# Patient Record
Sex: Female | Born: 1955 | Race: White | Hispanic: No | Marital: Married | Smoking: Former smoker
Health system: Southern US, Community
[De-identification: ages and names within clinical notes are randomized; demographics above are authoritative.]

## PROBLEM LIST (undated history)

## (undated) DIAGNOSIS — K219 Gastro-esophageal reflux disease without esophagitis: Secondary | ICD-10-CM

## (undated) DIAGNOSIS — L409 Psoriasis, unspecified: Secondary | ICD-10-CM

## (undated) DIAGNOSIS — C50919 Malignant neoplasm of unspecified site of unspecified female breast: Secondary | ICD-10-CM

## (undated) DIAGNOSIS — R519 Headache, unspecified: Secondary | ICD-10-CM

## (undated) DIAGNOSIS — R0600 Dyspnea, unspecified: Secondary | ICD-10-CM

## (undated) DIAGNOSIS — M1990 Unspecified osteoarthritis, unspecified site: Secondary | ICD-10-CM

## (undated) DIAGNOSIS — I10 Essential (primary) hypertension: Secondary | ICD-10-CM

## (undated) DIAGNOSIS — Z9109 Other allergy status, other than to drugs and biological substances: Secondary | ICD-10-CM

## (undated) DIAGNOSIS — H35419 Lattice degeneration of retina, unspecified eye: Secondary | ICD-10-CM

## (undated) DIAGNOSIS — C801 Malignant (primary) neoplasm, unspecified: Secondary | ICD-10-CM

## (undated) DIAGNOSIS — Z961 Presence of intraocular lens: Secondary | ICD-10-CM

## (undated) DIAGNOSIS — J3089 Other allergic rhinitis: Secondary | ICD-10-CM

## (undated) DIAGNOSIS — M549 Dorsalgia, unspecified: Secondary | ICD-10-CM

## (undated) HISTORY — PX: HX BREAST BIOPSY: SHX20

## (undated) HISTORY — PX: HX LASER EYE SURGERY: 2100001140

## (undated) HISTORY — DX: Other allergic rhinitis: J30.89

## (undated) HISTORY — DX: Lattice degeneration of retina, unspecified eye: H35.419

## (undated) HISTORY — DX: Psoriasis, unspecified: L40.9

## (undated) HISTORY — DX: Presence of intraocular lens: Z96.1

## (undated) HISTORY — DX: Dorsalgia, unspecified: M54.9

## (undated) HISTORY — DX: Other allergy status, other than to drugs and biological substances: Z91.09

## (undated) HISTORY — DX: Essential (primary) hypertension: I10

## (undated) HISTORY — DX: Malignant neoplasm of unspecified site of unspecified female breast: C50.919

## (undated) HISTORY — PX: TUBAL LIGATION: SHX77

## (undated) HISTORY — PX: BREAST LUMPECTOMY: SHX2

## (undated) HISTORY — PX: BREAST SURGERY: SHX581

## (undated) HISTORY — PX: BREAST BIOPSY: SHX20

## (undated) HISTORY — PX: EYE SURGERY: SHX253

## (undated) NOTE — Procedures (Signed)
Procedure(s): DG ARTHRO W/ FLUORO HIP RIGHT  Pre-Procedure Diagnose(s): Chronic right hip pain  Post-Procedure Diagnose(s): Chronic right hip pain  Formatting of this note might be different from the original.  CLINICAL DATA:  Right hip pain  EXAM:  RIGHT HIP INJECTION UNDER FLUOROSCOPY  COMPARISON:  Radiograph 11/13/2021  FLUOROSCOPY:  Radiation Exposure Index (as provided by the fluoroscopic device): 28.2 mGy Kerma  PROCEDURE:   I discussed the risks (including hemorrhage, infection, and local anesthetic chondotoxicity, among others), benefits, and alternatives to the procedure with the patient.  We specifically discussed the high technical likelihood of success of the procedure.  The patient understood and elected to undergo the procedure.      Standard time-out was employed.  Following sterile skin prep and local anesthetic administration consisting of 1% lidocaine, initial attempts to access the right hip joint using a 3.5 inch 22 gauge needle were unsuccessful, with small volumes of injected Omnipaque 300 contrast medium tracking along the iloipsoas.  It was apparent that the 3.5 inch needle was not quite long enough to access the joint.  Subsequently, a 20 gauge 5 inch needle was advanced without difficulty into the right hip joint under fluoroscopic guidance, intra-articular location confirmed with brief contrast injection.  Forty mg Depo-Medrol and 5 ml Sensorcaine 0.5% were then administered.  The needle was subsequently removed and the skin cleansed and bandaged.  No immediate complications were observed.    IMPRESSION:  Technically successful therapeutic right hip injection under fluoroscopy.    Electronically signed by Sherryl Barters, MD at 11/26/2021 11:56 AM EDT

## (undated) NOTE — Telephone Encounter (Signed)
Formatting of this note might be different from the original.  Images from the original note were not included.    Prescription Request    01/24/2022    Is this a "Controlled Substance" medicine? no    Have you seen your PCP in the last 2 weeks? no    If YES, route message to pool  -  If NO, patient needs to be scheduled for appointment.    What is the name of the medication or equipment? omeprazole (PRILOSEC) 20 MG capsule, atorvastatin (LIPITOR) 20 MG tablet, baclofen (LIORESAL) 10 MG tablet, montelukast (SINGULAIR) 10 MG tablet    Have you contacted your pharmacy to request a refill? yes     Which pharmacy would you like this sent to? [facility] ([facility]) - [place], [place]    Patient notified that their request is being sent to the clinical staff for review and that they should receive a response within 2 business days.     Electronically signed by Augustin Schooling at 01/24/2022  3:42 PM EDT

---
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
## 2006-05-27 DIAGNOSIS — H33311 Horseshoe tear of retina without detachment, right eye: Secondary | ICD-10-CM

## 2006-05-27 HISTORY — DX: Horseshoe tear of retina without detachment, right eye: H33.311

## 2007-03-11 ENCOUNTER — Ambulatory Visit (HOSPITAL_COMMUNITY): Payer: Self-pay | Admitting: FAMILY PRACTICE

## 2007-06-18 ENCOUNTER — Ambulatory Visit (INDEPENDENT_AMBULATORY_CARE_PROVIDER_SITE_OTHER): Payer: Self-pay | Admitting: Neurological Surgery

## 2007-06-25 ENCOUNTER — Encounter (INDEPENDENT_AMBULATORY_CARE_PROVIDER_SITE_OTHER): Payer: 59 | Admitting: Neurological Surgery

## 2010-05-27 DIAGNOSIS — Z8669 Personal history of other diseases of the nervous system and sense organs: Secondary | ICD-10-CM

## 2010-05-27 HISTORY — DX: Personal history of other diseases of the nervous system and sense organs: Z86.69

## 2010-05-27 HISTORY — PX: HX RETINAL DETACHMENT REPAIR: SHX105

## 2010-11-03 NOTE — Letter (Signed)
[facility] Department of Neurosurgery     [facility]  [address], [address]  [place], [place] [address]       PATIENT NAME: Patricia Martinez, Patricia Martinez  CHART NUMBER: [id number]  DATE OF BIRTH: 1955-10-18  DATE OF SERVICE: 06/25/2007    June 28, 2007     Merita Norton, MD  [address]   [place], [place] [address]    Dear Dr. Sela Hilding:    I saw Patricia Martinez in the [place] office on June 25, 2007.  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  She presents today with only axial back pain.  There is no leg pain and no reports of weakness in the lower extremities.    On physical examination, there are decreased knee and ankle reflexes bilaterally.  Motor is 5/5.  Straight-leg raise is negative.    Lumbar CT scan from July 2008 was reviewed and shows moderate stenosis at L4-L5, and there is what appears to be a synovial cyst on the right at L4-L5.  At the L5-S1 level, there is severe bilateral neural foraminal narrowing and the left S1 nerve root in particular looks swollen and inflamed.    Despite the rather dramatic findings on MRI scan, the patient's neurologic exam is basically normal and she does not have lower extremity pain.  Therefore, I would continue with conservative treatment and a trial of Motrin for rather advanced case of multilevel degenerative changes throughout the lumbar spine.  If the patient should develop lower extremity pain in the sciatic distribution, we will need to see her back for surgical reevaluation.    Thank you very much and if you have any questions, please feel free to call me.     Sincerely,      Jola Baptist, MD  Associate Professor  [facility] Department of Neurosurgery    [id number]; D: 06/28/2007 18:32:44; T: 06/30/2007 78:46:96

## 2011-05-28 HISTORY — PX: HX CATARACT REMOVAL: SHX102

## 2014-01-14 ENCOUNTER — Encounter (HOSPITAL_COMMUNITY): Payer: Self-pay

## 2014-01-14 NOTE — Ancillary Notes (Addendum)
[place] Record for: Patricia, Martinez  Created: 01/11/2014 1:29:18 PM  MRN: [id number]  DOB: 13-Aug-1955  SSN: [id number]  Sex: Female  Height: 5 Feet 9 Inches - Weight: Blunt Name:   Address:   [address]: [place], [place] [address]  E-Mail:   Day Phone: [phone] Night Phone:  Other Phone:   Phone comments:   Call Back time:   Authorized contact: norman--husbandbenjamin--son  Intake Date: 01/11/2014 1:29:18 PM  PCP: Clydene Laming MD, Rodman Key   RefMD: Self , Referral   Primary Insurance: [employer]  Secondary Insurance:   Insurance Comments:      -------Referral Assignment------  Where was the original referral directed?: [facility]  Was a specific reviewer requested?: Specific MD Requested  How was the reviewer selected?:   Who requested the specific reviewer?: Patient Preference  How did you hear of our spine program?:   Is this a second opinion?:      -------Web [place]----------  Favorite Color:   City of Birth:      ---------- 1st Review ----------     Review Date: 01/27/2014  Review Completed by: John Iran  Impression: DDD of Lumbar Spine  Disposition: Appointment/Treatment with Colleague  Appt w/ Colleague: First Available Appointment  Colleague Name: Delray Alt, MD  Appt How Soon:   Pre Treatment Type: Physical Therapy; Weight Management  Pre Treatment Type Details:   Pre Treatment Other Test:   Appt Type:   Instructions: LBP and bilateral hip vs buttock painMRI and x-rays with mild DDD mod L5-S1 and L4-5 facet arthrosis.Left greater than right foraminal narrowing.Canal wide open.Needs PT, weight loss and see a non-surgical provider.  Could consider facet injections.  No surgery indicated.     ---------- Symptoms ----------     Chief complaint: lbp--b/l hipno leg/groin/buttock pain  Diagnosis from Other MD:      Symptoms: Pain,Muscular weakness  Other Symptom Description:   Pain Location: Low back,Hip  Other Pain Location Description:   Where is your pain the worst?: Low  back  Pain Type: Sharp/Stabbing,Constant,Dull/Aching  Pain Rating: 8  Does the pain radiate to other parts of your body? No   Radiate Where:   Other Description:   Does it radiate to the fingers?   Does it radiate below the elbow?   Which specific part of your arm?   Which fingers?   Which part of your arm does pain go to?   How does it radiate to the arm?   Does it radiate to the toes?   Does it radiate below the knee?   Which specific part of your leg?   Which toes?   Which part of your leg does pain go to?   How does it radiate to the leg?   Additional pain information      Location of Numbness/tingling:   Other Description:  Does numbness/tingling radiate to other parts of your body?:  Where does the numbess/tingling radiate?:  Other Description:  Does the numbness/tingling radiate below your elbow?:  Which specific part of your arm?:  Which fingers:  Which pat of your arm does the numbness/tingling go to?:  How does the numbness/tingling radiate to your arm?:  Does the numbness/tingling radiate below your knee?:  Which specific part of your leg?:  Which toes:  Which part of your leg does the numbness/tingling go to?:  How does the numbness/tingling radiate to your leg?:  Additional Numbness/tingling information:  Other Description:  Location of Weakness: Left Arm,Right Arm  Other Description:   Additional Weakness Information: patient stated it's been going on for a year or more--painful to lift things--pt is right handed     The symptoms have been present for: more than 1 year  The symptoms began: 1987 fell down steps--has been getting worse  Was there a specific event that caused your symptoms?: fell in 1987  Additional Narrative Description:   Are you able to perform your daily activities with these symptoms?: Yes  Since what date have you been unable to perform your daily routine?:   The symptoms improve when you: Walk,Lie down,Rest  Other activities that improve your symptoms:   The symptoms worsen  when you: Stand,Activity  Other activities that worsen your symptoms:      ---------- Work History ----------     Are you able to work?: Yes  Reasons for not working:   Other Reason for not working:   Do you have Work Restrictions: No  If applicable, maximum lifting restriction:   How long have you been unable to work:   Have you ever filed a W/C claim related to a neck or back injury?: no  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 No  Allergies:   Other Details:   Allergic to Latex?: No  Allergic to intravenous contrast dyes?: No  Allergic to steroids?: No     ---------- Treatment/Testing ----------      Taking prescription medication for this problem?: Yes  Are you using any now?: Yes  Medication: doesn't know the name; MD Name: ; Date Prescribed: ; Dosage: ; Times/Day:   Have you received a Medrol dose pack for this problem?: No  When did you last take the dosepack?:   Were your symptoms improved?:   Would you describe your relief as:   How long did you experience that amount of relief?:   Other Medrol dosepack information:      --------------- PT ---------------     PT: No  When Received:   Where Received:   Other where received information:   Visits:   Types:   Other Types:   Improved:      If improved, describe level of relief:   If improved, how long did you experience relief:   Other Physical Therapy Treatment Information:      ---------- Chiropractic Services ----------     Chiro: No  When:   Who:   Visits:   Types:   Other Types:   Were Symptoms Improved:    If improved, describe level of relief:   If improved, how long did you experience relief:   Other Chiropractic Treatment Information:      --------------- ESI ---------------     ESI: No  When:   Who:   Visits:   Types:   Improved:   If improved, describe level of relief:   If improved, how long did you experience relief:   Other Injection Treatment Information:      ---------- Diagnostic Tests ----------  Plain spine X-rays On:08-20-13 Where: [facility]  Area(s) Scanned: Lumbar  MRI scan On:08-20-13 Where: [facility]  Area Scanned: Lumbar  Do you have any of the following in case an MRI is ordered?: Claustrophobia  Other MRI factors:      ---------- Past Medical History ----------     What other doctors/providers have treated you for these spine issues? Dr. Rudene Re Specialty: Neurosurgery Date: 2009    Ever diagnosed with Spine deformity?:   Prior neck or back surgery (1)?: No  When:   Who:   Area of neck/spine operated on:   Level:   Were symptoms improved?:   If improved, describe level of relief:  If improved, how long did you experience relief?:   Prior neck or back surgery (2)?:   When:   Who:   Area of neck/spine operated on:   Level:   Were symptoms improved?:   If improved, describe level of relief:  If improved, how long did you experience relief?:   Prior neck or back surgery (3)?:   When:   Who:   Area of neck/spine operated on:   Level:   Were symptoms improved?:   If improved, describe level of relief:  If improved, how long did you experience relief?:   Do you have any of the following assistive devices? Scooter  How long have you required these assistive devices?   Currently being treated for any other medical condition?: No  Conditions:   Other Conditions:   Type of neurologic disorder:   Cancer Type:   Other Treatment/Medication:   What blood thinners are you currently taking?: None  Which physicians are treating you for your other medical conditions?:   Do you smoke?: No  Are  you pre-menopausal or post-menopausal?:   If recommended, are you willing to consider surgery?:   What is your goal in seeking treatment?:   Other pertinent information/ general comments/ goals for treatment: no hx ca     ---------- Care Coordinator Information ----------     Care Coordinator: RN  Activity Log: 01/27/2014 12:17:16 PM [Lori Mueller]: Phone call to patient to discuss MD initial impression and recommendations.  Left message and phone  with husband Mariea Clonts for patient to return call, as he wanted [place] to speak with patient. Antionette Poles, RN, CRRN; 02/01/2014 8:37:54 AM Lattie Haw Herod]: Phone call to patient. Reviewed patient's medical history.  Patient states no change in symptoms.  Discussed MD initial impression and recommendations of PT, weight loss management and first available with non op MD, Dr. Donnal Debar.  Provided education on DDD of lumbar spine and facet arthrosis.  Patient chose to decline recommendations. She states she is already seeing a MD for her back issues.  Explained that a letter will be faxed to the PCP/referring physician regarding this conversation. Patient verbalized understanding.  Completing chart at this time.  Monia Pouch, RN     Letter/Test Coordinator: Letters  Letter/Test Coordinator Log:      Intake Specialist Comments:      Clinic Staff Comments:      Last Edited byMonia Pouch on 02/01/2014 8:39:36 AM  Last Review by: John Iran on 01/27/2014

## 2015-02-28 ENCOUNTER — Ambulatory Visit (INDEPENDENT_AMBULATORY_CARE_PROVIDER_SITE_OTHER): Payer: Self-pay | Admitting: Ophthalmology

## 2015-02-28 ENCOUNTER — Ambulatory Visit (INDEPENDENT_AMBULATORY_CARE_PROVIDER_SITE_OTHER): Payer: HMO | Admitting: OPHTHALMOLOGY

## 2015-02-28 ENCOUNTER — Ambulatory Visit (HOSPITAL_COMMUNITY)
Admission: RE | Admit: 2015-02-28 | Discharge: 2015-02-28 | Disposition: A | Discharge status: Home / Self Care | Payer: HMO | Source: Ambulatory Visit | Attending: OPHTHALMOLOGY | Admitting: OPHTHALMOLOGY

## 2015-02-28 ENCOUNTER — Ambulatory Visit
Admission: RE | Admit: 2015-02-28 | Discharge: 2015-02-28 | Disposition: A | Discharge status: Home / Self Care | Payer: HMO | Source: Ambulatory Visit | Attending: OPHTHALMOLOGY | Admitting: OPHTHALMOLOGY

## 2015-02-28 ENCOUNTER — Encounter (INDEPENDENT_AMBULATORY_CARE_PROVIDER_SITE_OTHER): Payer: Self-pay | Admitting: OPHTHALMOLOGY

## 2015-02-28 DIAGNOSIS — Z961 Presence of intraocular lens: Secondary | ICD-10-CM | POA: Insufficient documentation

## 2015-02-28 DIAGNOSIS — Z9109 Other allergy status, other than to drugs and biological substances: Secondary | ICD-10-CM | POA: Insufficient documentation

## 2015-02-28 DIAGNOSIS — H35419 Lattice degeneration of retina, unspecified eye: Secondary | ICD-10-CM

## 2015-02-28 DIAGNOSIS — H33311 Horseshoe tear of retina without detachment, right eye: Secondary | ICD-10-CM

## 2015-02-28 DIAGNOSIS — M549 Dorsalgia, unspecified: Secondary | ICD-10-CM | POA: Insufficient documentation

## 2015-02-28 DIAGNOSIS — Z8669 Personal history of other diseases of the nervous system and sense organs: Secondary | ICD-10-CM

## 2015-02-28 DIAGNOSIS — H26491 Other secondary cataract, right eye: Secondary | ICD-10-CM

## 2015-02-28 DIAGNOSIS — Z87891 Personal history of nicotine dependence: Secondary | ICD-10-CM | POA: Insufficient documentation

## 2015-02-28 DIAGNOSIS — H33001 Unspecified retinal detachment with retinal break, right eye: Secondary | ICD-10-CM

## 2015-02-28 DIAGNOSIS — H26493 Other secondary cataract, bilateral: Secondary | ICD-10-CM | POA: Insufficient documentation

## 2015-02-28 DIAGNOSIS — M1990 Unspecified osteoarthritis, unspecified site: Secondary | ICD-10-CM

## 2015-02-28 HISTORY — PX: HX YAG: 210000615

## 2015-02-28 NOTE — Telephone Encounter (Signed)
Patient's husband called because the patient is having pain in the right eye. Is s/p Yag capsulotomy today with Dr. Tomma Rakers. Recommended using either Ice packs or warm compresses (which ever helps) to improve pain, as well as tylenol. I offered to see the patient on [facility] if they desired however they live 2 hours away. They we instructed to call back if symptoms worsen or if vision deteriorates.     Lilla Shook, MD  02/28/2015, 17:50

## 2015-02-28 NOTE — Progress Notes (Addendum)
[place]  Operated by [facility]  [place] [address]  Dept: [phone]    Patient Name: Patricia Martinez  MRN#: [id number]  Rudd: [birth year]    Date of Service: 02/28/2015    Chief Complaint     Blurred Vision          Patricia Martinez is a 59 y.o. female who presents today for evaluation/consultation of:  HPI     Blurred Vision   In right eye.           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 Also lattice degernation OS s/p laser. Pt states OD vision is foggy x 1 yr or so. Pt states oS seems fine. PT states no eye pain. Pt states floaters common OS. No flashes. NO drops.        Last edited by Carey Bullocks, COT on 02/28/2015  9:53 AM. (History)        ROS     Positive for: Eyes (retina)    Negative for: Constitutional, Gastrointestinal, Neurological, Skin, Genitourinary, Musculoskeletal, HENT, Endocrine, Cardiovascular, Respiratory, Psychiatric, Allergic/Imm, Heme/Lymph    Last edited by Carey Bullocks, COT on 02/28/2015  9:53 AM. (History)           Carey Bullocks, COT 02/28/2015, 10:01     MD Addition to HPI: Patricia Martinez is a new patient referred by Dr Noreene Filbert from [facility]. Pt states she has no eye pain, however, her vision is foggy in her right eye.     Past Surgical History   Procedure Laterality Date    Hx retinal detachment repair Right 2012    Hx cataract removal Bilateral 2013     IOL OU - Dr Truman Hayward    Hx breast biopsy      Hx cesarean section             Past Medical History   Diagnosis Date    Arthritis     Back pain     H/O retinal detachment 2012     OD    Retinal tear of right eye 2008     OD    Lattice degeneration      OS    Pseudophakia of both eyes     Environmental allergies            Patient Active Problem List   Diagnosis    Spinal Stenosis - Lumbar    Arthritis    Back pain    H/O retinal detachment    Retinal tear of right eye    Lattice  degeneration    Pseudophakia of both eyes    Environmental allergies       Family History:  Family History   Problem Relation Age of Onset    Retinal Detachment Brother            Social History:     Social History     Social History    Marital Status: Married     Spouse Name: N/A    Number of Children: N/A    Years of Education: N/A     Social History Main Topics    Smoking status: Former Smoker    Smokeless tobacco: Never Used      Comment: quit 25 yrs ago    Alcohol Use: No    Drug Use: Not on  file    Sexual Activity: Not on file     Other Topics Concern    Not on file     Social History Narrative        Social History   Substance Use Topics    Smoking status: Former Smoker    Smokeless tobacco: Never Used      Comment: quit 25 yrs ago    Alcohol Use: No            My Assessment:    ICD-10-CM    1. Arthritis M19.90    2. Back pain M54.9    3. H/O retinal detachment Z86.69 OPH OCT BI   4. Retinal tear of right eye H33.001 OPH OCT BI   5. 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 Pseudophakia of both eyes Z96.1    7. Environmental allergies Z91.09        Ophthalmic Plan of Care:    1. 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 Discussed R/B/A of Yag OD. After much thought and careful consideration, the patient understands and wishes to proceed.   - Will proceed with Yag OD today     2. Pseudophakia OU  - Stable    3. ERM OU  OCT OD: ERM, slightly blunt foveal contour, ERM over foveal center, mild surface wrinkling, no IRF/SRF, baseline, macula attached  OCT OS: ERM nasally, slightly attenuated foveal contour, no extension of ERM into foveal center, no IRF/SRF, attached, baseline   - Monitor    4. PVD OS  - Monitor  I reviewed the signs and symptoms of retinal detachment with the patient, including new flashes or increased frequency of flashes, floaters or a change in number  or pattern of floaters, any decline in vision or change in visual field.  The patient understands and will contact us immediately or go to the nearest Emergency Department if these symptoms occur.    5. Hx of RD OD  S/p laser for "lattice" in her left eye (per history)  Retina attached in both eyes.  Will monitor.         I discussed the above diagnoses listed in the assessment and the above ophthalmic plan of care with the patient and patient's family.    Follow up:    I have asked Ashauna to follow up Post Op 2 weeks.          Documented chief complaint, history of present illness, allergies, review of systems, past medical, past surgical, family and social history were reviewed by me and when necessary I made changes to the technician note.  I also reviewed and agree with the findings documented in ophthalmology exam tab.  All questions were answered.      Orders Placed This Encounter   Procedures    OPH OCT BI       No orders of the defined types were placed in this encounter.       I scribed a portion of the encounter including Chief Complaint, HPI, Impression, Plan, and exam elements excluding visual acuity, lensometry, pupil assessment, confrontational visual fields, tonometry results, extraocular motility assessment, refractometry results, and assessment of mood and orientation. I scribed this note at the request of the physician who personally performed the services documented.  Tally Joe, SCRIBE 02/28/2015, 10:45    I scribed a  portion of the encounter including Chief Complaint, HPI, Impression, Plan, and exam elements excluding visual acuity, lensometry, pupil assessment, confrontational visual fields, tonometry results, extraocular motility assessment, refractometry results, and assessment of mood and orientation. I scribed this note at the request of the physician who personally performed the services documented.  Tally Joe, SCRIBE  02/28/2015, 10:45    I have reviewed and confirmed the ROS, PFSH,  and exam elements performed and documented by the technician. The scribed portion of the progress note was scribed on my behalf and at my direction. I have reviewed and attest to the accuracy of the note.   Jorie Zee [place], MD 02/28/2015, 13:00

## 2015-02-28 NOTE — Procedures (Addendum)
Laser Operative Report           Indication/Diagnosis:  Posterior Capsular Opacity    Type of laser performed: Posterior Capsulotomy (YAG)    Informed Consent Obtained?  yes  Surgical Pause:  yes    Time:  11:34AM    Anesthesia :  Topical     Eye: OD       Power/Energy 1.40mJ   Half Fluence NA   Spot Size  NA   Duration  NA   Number [id number]     Argon:  -- YAG laser      Findings/Additional Notes/Comment:  Other:   Conditions/complications: Few nicks on peripheral IOL, all non-central, all in far periphery outside of pupillary border when not dilated   The patient tolerated the procedure well and left the exam room in good condition.    The postoperative instructions were given, including medications and activity level, as well as a follow up appointment.    Janyra Barillas [place], MD  02/28/2015, 12:58

## 2015-03-16 ENCOUNTER — Encounter (INDEPENDENT_AMBULATORY_CARE_PROVIDER_SITE_OTHER): Payer: Self-pay | Admitting: OPHTHALMOLOGY

## 2015-03-16 ENCOUNTER — Ambulatory Visit: Payer: HMO | Attending: OPHTHALMOLOGY | Admitting: OPHTHALMOLOGY

## 2015-03-16 DIAGNOSIS — H43812 Vitreous degeneration, left eye: Secondary | ICD-10-CM | POA: Insufficient documentation

## 2015-03-16 DIAGNOSIS — H26491 Other secondary cataract, right eye: Secondary | ICD-10-CM | POA: Insufficient documentation

## 2015-03-16 DIAGNOSIS — Z48810 Encounter for surgical aftercare following surgery on the sense organs: Secondary | ICD-10-CM | POA: Insufficient documentation

## 2015-03-16 DIAGNOSIS — Z9841 Cataract extraction status, right eye: Secondary | ICD-10-CM | POA: Insufficient documentation

## 2015-03-16 DIAGNOSIS — Z9889 Other specified postprocedural states: Secondary | ICD-10-CM

## 2015-03-16 DIAGNOSIS — Z9849 Cataract extraction status, unspecified eye: Secondary | ICD-10-CM

## 2015-03-16 DIAGNOSIS — Z9842 Cataract extraction status, left eye: Secondary | ICD-10-CM | POA: Insufficient documentation

## 2015-03-16 DIAGNOSIS — H35373 Puckering of macula, bilateral: Secondary | ICD-10-CM | POA: Insufficient documentation

## 2015-03-16 DIAGNOSIS — Z961 Presence of intraocular lens: Secondary | ICD-10-CM

## 2015-03-16 DIAGNOSIS — H35419 Lattice degeneration of retina, unspecified eye: Secondary | ICD-10-CM | POA: Insufficient documentation

## 2015-03-16 MED ORDER — ERYTHROMYCIN 5 MG/GRAM (0.5 %) EYE OINTMENT
TOPICAL_OINTMENT | Freq: Three times a day (TID) | OPHTHALMIC | Status: DC
Start: 2015-03-16 — End: 2015-11-06

## 2015-03-16 NOTE — Progress Notes (Addendum)
[place]  Operated by [facility]  [place] [address]  Dept: [phone]    Patient Name: Patricia Martinez  MRN#: [id number]  [place]: [birth year]    Date of Service: 03/16/2015    Chief Complaint     Post-op Eye          Yennifer Jeanetta Alonzo is a 59 y.o. female who presents today for evaluation/consultation of:  HPI     Post-op Eye   In right eye.  Pain was noted as 0/10.           Comments   Pt here for 2 week f/u after having YAG OD at last visit. Pt states vision is much better OD. Pt denies any eye pain.   Pt not using any eye drops daily.        Last edited by Betsey Amen, Ophthalmic T on 03/16/2015  8:20 AM. (History)        ROS     Positive for: Eyes (YAG OD)    Negative for: Constitutional, Gastrointestinal, Neurological, Skin, Genitourinary, Musculoskeletal, HENT, Endocrine, Cardiovascular, Respiratory, Psychiatric, Allergic/Imm, Heme/Lymph    Last edited by Betsey Amen, Ophthalmic T on 03/16/2015  8:20 AM. (History)           Betsey Amen, Ophthalmic T 03/16/2015, 08:32     MD Addition to HPI: Pt is here for a post op visit. PT denies any pain. Pt not currently on any drops.     Past Surgical History   Procedure Laterality Date    Hx retinal detachment repair Right 2012    Hx cataract removal Bilateral 2013     IOL OU - Dr Truman Hayward    Hx breast biopsy      Hx cesarean section      Hx yag Right 02/28/2015           Past Medical History   Diagnosis Date    Arthritis     Back pain     H/O retinal detachment 2012     OD    Retinal tear of right eye 2008     OD    Lattice degeneration      OS    Pseudophakia of both eyes     Environmental allergies            Patient Active Problem List   Diagnosis    Spinal Stenosis - Lumbar    Arthritis    Back pain    H/O retinal detachment    Retinal tear of right eye    Lattice degeneration    Pseudophakia of both eyes    Environmental allergies       Family History:  Family History   Problem  Relation Age of Onset    Retinal Detachment Brother            Social History:     Social History     Social History    Marital Status: Married     Spouse Name: N/A    Number of Children: N/A    Years of Education: N/A     Social History Main Topics    Smoking status: Former Smoker    Smokeless tobacco: Never Used      Comment: quit 25 yrs ago    Alcohol Use: No    Drug Use: Not on file    Sexual Activity: Not on file     Other Topics Concern  Not on file     Social History Narrative        Social History   Substance Use Topics    Smoking status: Former Smoker    Smokeless tobacco: Never Used      Comment: quit 25 yrs ago    Alcohol Use: No            My Assessment:    ICD-10-CM    1. PCO (posterior capsular opacification), right H26.491    2. S/P YAG capsulotomy, unspecified laterality Z98.49    3. Post-operative state Z98.890    4. Pseudophakia of both eyes Z96.1    5. Epiretinal membrane (ERM) of both eyes H35.373    6. PVD (posterior vitreous detachment), left eye H43.812    7. Lattice degeneration, unspecified laterality H35.419        Ophthalmic Plan of Care:    1. PCO OU (OD>OS)s/p YAG capsulotomy OD   - Few attachments of PC still holding peripherally; Will proceed with supplemental Yag today     2. Pseudophakia OU s/p Yag OD   - See above     3. ERM OU  OCT OD: ERM, slightly blunt foveal contour, ERM over foveal center, mild surface wrinkling, no IRF/SRF, baseline, macula attached  OCT OS: ERM nasally, slightly attenuated foveal contour, no extension of ERM into foveal center, no IRF/SRF, attached, baseline   - Monitor    4. PVD OS  - Monitor  I reviewed the signs and symptoms of retinal detachment with the patient, including new flashes or increased frequency of flashes, floaters or a change in number or pattern of floaters, any decline in vision or change in visual field. The patient understands and will contact us immediately or go to the nearest Emergency Department if these symptoms  occur.    5. Hx of RD OD S/p laser for "lattice" in her left eye (per history)  - Retina attached in both eyes. Will monitor.       I discussed the above diagnoses listed in the assessment and the above ophthalmic plan of care with the patient and patient's family.    Follow up:    I have asked Shae to follow up in 1 month, DFE, OCT OU.          Documented chief complaint, history of present illness, allergies, review of systems, past medical, past surgical, family and social history were reviewed by me and when necessary I made changes to the technician note.  I also reviewed and agree with the findings documented in ophthalmology exam tab.  All questions were answered.      No orders of the defined types were placed in this encounter.       No orders of the defined types were placed in this encounter.       I scribed a portion of the encounter including Chief Complaint, HPI, Impression, Plan, and exam elements excluding visual acuity, lensometry, pupil assessment, confrontational visual fields, tonometry results, extraocular motility assessment, refractometry results, and assessment of mood and orientation. I scribed this note at the request of the physician who personally performed the services documented.  Tally Joe, SCRIBE 03/16/2015, 09:30    I scribed a portion of the encounter including Chief Complaint, HPI, Impression, Plan, and exam elements excluding visual acuity, lensometry, pupil assessment, confrontational visual fields, tonometry results, extraocular motility assessment, refractometry results, and assessment of mood and orientation. I scribed this note at the request of the physician who personally performed  the services documented.  Tally Joe, SCRIBE  03/16/2015, 09:30      I have reviewed and confirmed the ROS, PFSH, and exam elements performed and documented by the technician. The scribed portion of the progress note was scribed on my behalf and at my direction. I have reviewed and  attest to the accuracy of the note.   De Libman Hart [place], MD 03/16/2015, 13:01      03/16/2015  I saw and examined the patient.  I reviewed the fellow's note.  I agree with the findings and plan of care as documented in the fellow's note.  Any exceptions/additions are edited/noted.    Shamira Toutant Hart [place], MD

## 2015-04-17 ENCOUNTER — Ambulatory Visit
Admission: RE | Admit: 2015-04-17 | Discharge: 2015-04-17 | Disposition: A | Discharge status: Home / Self Care | Payer: HMO | Source: Ambulatory Visit | Attending: OPHTHALMOLOGY | Admitting: OPHTHALMOLOGY

## 2015-04-17 ENCOUNTER — Encounter (INDEPENDENT_AMBULATORY_CARE_PROVIDER_SITE_OTHER): Payer: Self-pay | Admitting: OPHTHALMOLOGY

## 2015-04-17 ENCOUNTER — Ambulatory Visit (HOSPITAL_BASED_OUTPATIENT_CLINIC_OR_DEPARTMENT_OTHER): Payer: HMO | Admitting: OPHTHALMOLOGY

## 2015-04-17 DIAGNOSIS — H26491 Other secondary cataract, right eye: Secondary | ICD-10-CM

## 2015-04-17 DIAGNOSIS — H33001 Unspecified retinal detachment with retinal break, right eye: Secondary | ICD-10-CM | POA: Insufficient documentation

## 2015-04-17 DIAGNOSIS — H43812 Vitreous degeneration, left eye: Secondary | ICD-10-CM | POA: Insufficient documentation

## 2015-04-17 DIAGNOSIS — H33311 Horseshoe tear of retina without detachment, right eye: Secondary | ICD-10-CM

## 2015-04-17 DIAGNOSIS — Z8669 Personal history of other diseases of the nervous system and sense organs: Secondary | ICD-10-CM

## 2015-04-17 DIAGNOSIS — H35373 Puckering of macula, bilateral: Secondary | ICD-10-CM

## 2015-04-17 DIAGNOSIS — H35419 Lattice degeneration of retina, unspecified eye: Secondary | ICD-10-CM

## 2015-04-17 NOTE — Progress Notes (Addendum)
[place]  Operated by [facility]  [address]  [place] [address]  Dept: [phone]    Patient Name: Patricia Martinez  MRN#: [id number]  [place]: 06-23-1955    Date of Service: 04/17/2015    Chief Complaint     Follow Up - Retina Problem          Patricia Martinez is a 59 y.o. female who presents today for evaluation/consultation of:  HPI     1 month OCT/DFE OU to monitor ERM OU, PVD, PCO  Pt states vision stable OU @ dist and near x LOV  Denies any pain  / discomfort  Denies any new fl /fl         Last edited by Patricia Martinez, Patricia Martinez on 04/17/2015 10:42 AM.     ROS     Negative for: Constitutional, Gastrointestinal, Neurological, Skin, Genitourinary, Musculoskeletal, HENT, Endocrine, Cardiovascular, Eyes, Respiratory, Psychiatric, Allergic/Imm, Heme/Lymph    Last edited by Patricia Martinez on 04/17/2015 10:42 AM. (History)           Patricia Martinez, [facility] 04/17/2015, 10:45     MD Addition to HPI:     Past Surgical History   Procedure Laterality Date    Hx retinal detachment repair Right 2012    Hx cataract removal Bilateral 2013     IOL OU - Patricia Martinez    Hx breast biopsy      Hx cesarean section      Hx yag Right 02/28/2015           Past Medical History   Diagnosis Date    Arthritis     Back pain     H/O retinal detachment 2012     OD    Retinal tear of right eye 2008     OD    Lattice degeneration      OS    Pseudophakia of both eyes     Environmental allergies            Patient Active Problem List   Diagnosis    Spinal Stenosis - Lumbar    Arthritis    Back pain    H/O retinal detachment    Retinal tear of right eye    Lattice degeneration    Pseudophakia of both eyes    Environmental allergies    S/P YAG capsulotomy    Right posterior capsular opacification    Post-operative state    PVD (posterior vitreous detachment), left eye    Epiretinal membrane (ERM) of both eyes       Family History:  Family History   Problem Relation Age of  Onset    Retinal Detachment Brother            Social History:     Social History     Social History    Marital Status: Married     Spouse Name: N/A    Number of Children: N/A    Years of Education: N/A     Social History Main Topics    Smoking status: Former Smoker    Smokeless tobacco: Never Used      Comment: quit 25 yrs ago    Alcohol Use: No    Drug Use: Not on file    Sexual Activity: Not on file     Other Topics Concern    Not on file     Social History Narrative        Social History  Substance Use Topics    Smoking status: Former Smoker    Smokeless tobacco: Never Used      Comment: quit 25 yrs ago    Alcohol Use: No            My Assessment:    ICD-10-CM    1. H/O retinal detachment Z86.69 OPH OCT BI   2. Retinal tear of right eye H33.001 OPH OCT BI   3. Lattice degeneration, unspecified laterality H35.419 OPH OCT BI   4. Right posterior capsular opacification H26.491 OPH OCT BI   5. PVD (posterior vitreous detachment), left eye H43.812 OPH OCT BI   6. Epiretinal membrane (ERM) of both eyes H35.373 OPH OCT BI       Ophthalmic Plan of Care:      1. Pseudophakia OU s/p Yag OD   - Stable; no residual center opacity   - Will re-evaluate in 6 months to consider Yag OS     2. ERM OU  OCT OD: ERM, slightly blunt foveal contour, ERM over foveal center, mild surface wrinkling, no IRF/SRF, macula attached  OCT OS: ERM nasally, slightly attenuated foveal contour, no extension of ERM into foveal center, no IRF/SRF, attached  - Monitor    3. PVD OS  - Monitor  I reviewed the signs and symptoms of retinal detachment with the patient, including new flashes or increased frequency of flashes, floaters or a change in number or pattern of floaters, any decline in vision or change in visual field. The patient understands and will contact us immediately or go to the nearest Emergency Department if these symptoms occur.    4. Hx of RD OD S/p laser for "lattice" in her left eye (per history)  - Retina attached in  both eyes. Will monitor.     I discussed the above diagnoses listed in the assessment and the above ophthalmic plan of care with the patient and patient's family.    Follow up:    I have asked Patricia Martinez to follow up in 6 months, DFE OU OCT OU          Documented chief complaint, history of present illness, allergies, review of systems, past medical, past surgical, family and social history were reviewed by me and when necessary I made changes to the technician note.  I also reviewed and agree with the findings documented in ophthalmology exam tab.  All questions were answered.      Orders Placed This Encounter   Procedures    OPH OCT BI       No orders of the defined types were placed in this encounter.       I scribed a portion of the encounter including Chief Complaint, HPI, Impression, Plan, and exam elements excluding visual acuity, lensometry, pupil assessment, confrontational visual fields, tonometry results, extraocular motility assessment, refractometry results, and assessment of mood and orientation. I scribed this note at the request of the physician who personally performed the services documented.  Patricia Martinez, SCRIBE 04/17/2015, 10:52    I scribed a portion of the encounter including Chief Complaint, HPI, Impression, Plan, and exam elements excluding visual acuity, lensometry, pupil assessment, confrontational visual fields, tonometry results, extraocular motility assessment, refractometry results, and assessment of mood and orientation. I scribed this note at the request of the physician who personally performed the services documented.  Patricia Martinez, SCRIBE  04/17/2015, 10:52      I have reviewed and confirmed the ROS, PFSH, and exam elements performed and documented by  the technician. The scribed portion of the progress note was scribed on my behalf and at my direction. I have reviewed and attest to the accuracy of the note.   Patricia Frisina Hart [place], MD 04/17/2015, 18:04

## 2015-10-16 ENCOUNTER — Encounter (INDEPENDENT_AMBULATORY_CARE_PROVIDER_SITE_OTHER): Payer: HMO | Admitting: OPHTHALMOLOGY

## 2015-11-06 ENCOUNTER — Encounter (INDEPENDENT_AMBULATORY_CARE_PROVIDER_SITE_OTHER): Payer: Self-pay | Admitting: Ophthalmology

## 2015-11-06 ENCOUNTER — Ambulatory Visit
Admission: RE | Admit: 2015-11-06 | Discharge: 2015-11-06 | Disposition: A | Discharge status: Home / Self Care | Payer: HMO | Source: Ambulatory Visit | Attending: Ophthalmology | Admitting: Ophthalmology

## 2015-11-06 ENCOUNTER — Ambulatory Visit (INDEPENDENT_AMBULATORY_CARE_PROVIDER_SITE_OTHER): Payer: HMO | Admitting: Ophthalmology

## 2015-11-06 DIAGNOSIS — H43812 Vitreous degeneration, left eye: Secondary | ICD-10-CM

## 2015-11-06 DIAGNOSIS — H26491 Other secondary cataract, right eye: Secondary | ICD-10-CM

## 2015-11-06 DIAGNOSIS — H35373 Puckering of macula, bilateral: Secondary | ICD-10-CM

## 2015-11-06 DIAGNOSIS — Z8669 Personal history of other diseases of the nervous system and sense organs: Secondary | ICD-10-CM

## 2015-11-06 DIAGNOSIS — H35419 Lattice degeneration of retina, unspecified eye: Secondary | ICD-10-CM | POA: Insufficient documentation

## 2015-11-06 NOTE — Progress Notes (Addendum)
[place]  Operated by [facility]  [place] [address]  Dept: [phone]    Patient Name: Patricia Martinez  MRN#: [id number]  [place]: [birth year]    Date of Service: 11/06/2015    Chief Complaint     ERM (Epiretinal Membrane); Pvd          Patricia Martinez is a 60 y.o. female who presents today for evaluation/consultation of:  HPI     Pt is here today for 6 mo f/u ERM- OU; PVD- OS; HX RD- OD. PT is here for DFE/OCT-OU. PT states that vision is stable, no noticeable changes to vision. PT has a lot of floaters- both eyes-no change; no flashes.       Last edited by Blumetti, Tressia Miners D, COA on 11/06/2015 10:05 AM.     ROS     Positive for: Eyes (ERM- OU; PVD- OS; hx RD- OD; Floaters- OU)    Negative for: Constitutional, Gastrointestinal, Neurological, Skin, Genitourinary, Musculoskeletal, HENT, Endocrine, Cardiovascular, Respiratory, Psychiatric, Allergic/Imm, Heme/Lymph    Last edited by Gabriel Carina D, COA on 11/06/2015 10:08 AM. (History)           [name], COA 11/06/2015, 10:14       Past Surgical History:   Procedure Laterality Date    HX BREAST BIOPSY      HX CATARACT REMOVAL Bilateral 2013    IOL OU - Dr Conchita Paris CESAREAN SECTION      HX RETINAL DETACHMENT REPAIR Right 2012    HX YAG Right 02/28/2015           Past Medical History:   Diagnosis Date    Arthritis     Back pain     Environmental allergies     H/O retinal detachment 2012    OD    Lattice degeneration     OS    Pseudophakia of both eyes     Retinal tear of right eye 2008    OD           No past medical history pertinent negatives.    Patient Active Problem List   Diagnosis    Spinal Stenosis - Lumbar    Arthritis    Back pain    H/O retinal detachment    Retinal tear of right eye    Lattice degeneration    Pseudophakia of both eyes    Environmental allergies    S/P YAG capsulotomy    Right posterior capsular opacification    Post-operative state    PVD (posterior vitreous  detachment), left eye    Epiretinal membrane (ERM) of both eyes       Family History:  Family History   Problem Relation Age of Onset    Retinal Detachment Brother            Social History:     Social History   Substance Use Topics    Smoking status: Former Smoker    Smokeless tobacco: Never Used      Comment: quit 25 yrs ago    Alcohol use No       MD Addition to HPI: flaoters ou stabel for years, no flashes or peripheral visual changes          Assessment:      ICD-10-CM    1. H/O retinal detachment Z86.69 Ibuprofen 200 mg Oral Capsule     OPH OCT BI   2. Lattice degeneration, unspecified laterality  H35.419 Ibuprofen 200 mg Oral Capsule     OPH OCT BI   3. Right posterior capsular opacification H26.491 Ibuprofen 200 mg Oral Capsule     OPH OCT BI   4. PVD (posterior vitreous detachment), left eye H43.812 Ibuprofen 200 mg Oral Capsule     OPH OCT BI   5. Epiretinal membrane (ERM) of both eyes H35.373 Ibuprofen 200 mg Oral Capsule     OPH OCT BI       Ophthalmic Plan of Care:    Previous pt of Dr Tomma Rakers   H/o rd od sp repair in past and h/o laser os     - mild erm ou follow     I reviewed all signs and symptoms of retinal detachments including flashes, new floaters and peripheral visual changes. I informed the patient to return or call us immediately if this occurs or if the patient develops any new visual concerns.         Follow up:    I have asked Marticia Jequetta Aberg to follow up in 1 year dfe ou oct ou          Con Memos, MD 11/06/2015, 11:23    I have seen and examined the above patient. I discussed the above diagnoses listed in the assessment and the above ophthalmic plan of care with the patient and patient's family. All questions were answered. I reviewed and, when necessary, made changes to the technician/resident note, documented ophthalmology exam, chief complaint, history of present illness, allergies, review of systems, past medical, past surgical, family and social history. I personally  reviewed and interpreted all testing and/or imaging performed at this visit and agree with the resident's or fellow's interpretation. Any exceptions/additions are edited/noted in the relevant encounter fields.    Orders Placed This Encounter   Procedures    OPH OCT BI       No orders of the defined types were placed in this encounter.

## 2020-05-27 DIAGNOSIS — Z923 Personal history of irradiation: Secondary | ICD-10-CM

## 2020-05-27 DIAGNOSIS — C50919 Malignant neoplasm of unspecified site of unspecified female breast: Secondary | ICD-10-CM

## 2020-05-27 HISTORY — DX: Malignant neoplasm of unspecified site of unspecified female breast: C50.919

## 2020-05-27 HISTORY — DX: Personal history of irradiation: Z92.3

## 2020-05-27 HISTORY — PX: BREAST LUMPECTOMY: SHX2

## 2020-08-14 ENCOUNTER — Other Ambulatory Visit (INDEPENDENT_AMBULATORY_CARE_PROVIDER_SITE_OTHER): Payer: Self-pay | Admitting: Thoracic Surgery (Cardiothoracic Vascular Surgery)

## 2020-08-14 ENCOUNTER — Other Ambulatory Visit (HOSPITAL_COMMUNITY): Payer: Self-pay | Admitting: Thoracic Surgery (Cardiothoracic Vascular Surgery)

## 2020-08-14 DIAGNOSIS — N6489 Other specified disorders of breast: Secondary | ICD-10-CM

## 2020-08-21 ENCOUNTER — Other Ambulatory Visit (HOSPITAL_COMMUNITY): Payer: Self-pay | Admitting: Thoracic Surgery (Cardiothoracic Vascular Surgery)

## 2020-08-21 DIAGNOSIS — N6320 Unspecified lump in the left breast, unspecified quadrant: Secondary | ICD-10-CM

## 2020-09-01 ENCOUNTER — Other Ambulatory Visit: Payer: Self-pay

## 2020-09-04 ENCOUNTER — Encounter (INDEPENDENT_AMBULATORY_CARE_PROVIDER_SITE_OTHER): Payer: Self-pay | Admitting: Thoracic Surgery (Cardiothoracic Vascular Surgery)

## 2020-09-04 DIAGNOSIS — N6002 Solitary cyst of left breast: Secondary | ICD-10-CM | POA: Insufficient documentation

## 2020-09-05 ENCOUNTER — Ambulatory Visit: Payer: HMO | Attending: Family | Admitting: Family

## 2020-09-05 ENCOUNTER — Ambulatory Visit (HOSPITAL_COMMUNITY)
Admission: RE | Admit: 2020-09-05 | Discharge: 2020-09-05 | Disposition: A | Discharge status: Home / Self Care | Payer: HMO | Source: Ambulatory Visit

## 2020-09-05 ENCOUNTER — Encounter (HOSPITAL_COMMUNITY): Payer: Self-pay

## 2020-09-05 ENCOUNTER — Other Ambulatory Visit: Payer: Self-pay

## 2020-09-05 ENCOUNTER — Ambulatory Visit (HOSPITAL_COMMUNITY): Payer: HMO

## 2020-09-05 ENCOUNTER — Encounter (INDEPENDENT_AMBULATORY_CARE_PROVIDER_SITE_OTHER): Payer: Self-pay | Admitting: Family

## 2020-09-05 DIAGNOSIS — D0512 Intraductal carcinoma in situ of left breast: Secondary | ICD-10-CM

## 2020-09-05 DIAGNOSIS — N63 Unspecified lump in unspecified breast: Secondary | ICD-10-CM

## 2020-09-05 DIAGNOSIS — N6459 Other signs and symptoms in breast: Secondary | ICD-10-CM | POA: Insufficient documentation

## 2020-09-05 DIAGNOSIS — N6320 Unspecified lump in the left breast, unspecified quadrant: Secondary | ICD-10-CM

## 2020-09-05 DIAGNOSIS — Z803 Family history of malignant neoplasm of breast: Secondary | ICD-10-CM | POA: Insufficient documentation

## 2020-09-05 DIAGNOSIS — R928 Other abnormal and inconclusive findings on diagnostic imaging of breast: Secondary | ICD-10-CM | POA: Insufficient documentation

## 2020-09-05 HISTORY — PX: HX BREAST BIOPSY: SHX20

## 2020-09-05 HISTORY — DX: Intraductal carcinoma in situ of left breast: D05.12

## 2020-09-05 MED ORDER — LIDOCAINE HCL 10 MG/ML (1 %) INJECTION SOLUTION
15.0000 mL | Freq: Once | INTRAMUSCULAR | Status: DC
Start: 2020-09-05 — End: 2020-09-06

## 2020-09-05 NOTE — Discharge Instructions (Signed)
Minimally Invasive Breast Biopsy Care Instructions                You may take a shower in 48 hours. Let the water run over the incision. Pat it dry. No bathing or soaking your breast in water until the steri-strips fall off or after your follow-up visit.            If the incision comes open, place a dry band-aid on it. DO NOT apply ointments to the incision.            Check your incision frequently (every hour for the next four hours). If there would be bleeding present, apply firm pressure against the incision. If after 10 minutes of pressure, the area does not stop bleeding, go to the nearest E.R or Urgent Care Center.            Wear the ACE bandage over your bra for the next 12-24 hours as tolerated. Wear your bra continuously for the next 72 hours after removing the ACE bandage.             Keep ice on the incision for 20 minutes and then take it off for 20 minutes continually rotating until bedtime tonight.             You may have mild to moderate discomfort and a small to moderate amount of bruising. This is normal.            If you need medication for discomfort, take non-aspirin products (such as Tylenol or Acetaminophen), as directed by the manufacture for pain. DO NOT USE ASPIRIN for 48 hours.            Please avoid strenuous activities such as heavy cleaning, sweeping, vacuuming, yard work, or exercise for 72 hours. You should not lift anything with the affected side greater than 5 pounds for the next 72 hours.            Watch for signs of infection. Excessive bleeding, drainage, swelling, redness, heat, pain, or fever. If that should happen, please contact your physician. If it would be on a weekend please go to an Urgent Care Center or E.R.            For several days or even a couple weeks, you may have tenderness, discomfort, and a lump. This can be bothersome, but is not abnormal. Usually this will disappear completely within 2-3 months.      Your biopsy specimen will be sent to the  lab for further evaluation. You will have a follow-up appointment to receive your results. The Radiologist or Surgeon may recommend a follow-up mammogram based upon your biopsy results.

## 2020-09-05 NOTE — Progress Notes (Signed)
BREAST CLINIC, [facility] [address]  [place] [address]  Operated by [facility]  Progress Note    Name: Patricia Martinez MRN:  [id number]   Date: 09/05/2020 Age: 65 y.o.     Subjective:     Patient ID:  Patricia Martinez is an 65 y.o. female     Chief Complaint:    Chief Complaint   Patient presents with    Breast Problem     65 Year old with a 0.7 cm oval shaped, hypoechoic lesion in the 5:00 position of the left breast as identified by left diagnostic mammogram/left breast US 08/21/20, Cat 4; Bilateral  screening mammogram 08/12/20 Cat 0.         Breast Problem    Patient presenting for recent abnormal mammogram demonstrating 0.7cm oval shaped hypoechoic lesion at the 5 o'clock position of left breast. This was a screening mammogram; patient otherwise asymptomatic without breast complaints. She does have family history of breast cancer in her mother.  Denies skin changes, nipple changes, nipple discharge, breast pain, or palpable abnormalities.    Review of Systems   All other systems reviewed and are negative.      Objective:         Physical Exam  Chest:   Breasts:      Right: No inverted nipple, mass or tenderness.      Left: Inverted nipple present. No tenderness. Mass: 2 areas of increased density at 9:00 position left breast.       Skin:     General: Skin is warm and dry.   Neurological:      Mental Status: She is alert.   Psychiatric:         Behavior: Behavior normal.     Pulm:  Effort normal.    Breast exam:    The patient has no inverted nipple to the right breast.  Left inverted nipple and nipple scaling (Small area of psorasis 12:00 areola ).  There are fibrocystic changes to the right breast.  There are fibrocystic changes to the left breast.  Mass: 2 areas of increased density at 9:00 position left breast.  No mass within the right breast.  No tenderness to right breast.  No tenderness to left breast.  No right axillary lymph nodes are palpable.  No left axillary lymph nodes  are palpable.               Assessment & Plan:       ICD-10-CM    1. Abnormal mammogram of left breast  R92.8    Patient is scheduled for mammotome biopsy today.  Risks and benefits of procedure explained.  Plan to follow-up in 1 week to review pathology and determine if any further intervention is required.  All questions answered.    Henreitta Cea, FNP-C

## 2020-09-05 NOTE — Patient Instructions (Signed)
Getting Your Breast Biopsy Results  Waiting for biopsy results is never easy. But in most cases, you will get your results within days. Your provider can tell you what to expect.   You may get the results during a follow-up visit with your healthcare provider. Or your provider may call you with the results. Either way, your provider will discuss what thetest results mean for you. The lump may not be cancer. (This is called a benign lump.) Many benign lumps need no care at all. If a lump is cancer (malignant), you will have many things to think about.   Try to take comfort from the fact that more treatment options exist now than ever before. More tests may be needed to learn more about the cancer, such as how big it is and where it is. This helps your provider plan treatment. Your treatment team will work with you to come up with the plan that's best for you.     If a lump is benign  Learning that a lump is not cancer can be a great relief. To stay healthy, be sure to have mammograms as often as directed. Also, be aware of how your breasts normally look and feel so you can notice any changes right away. If you notice any changes, see your doctor to have them checked.   Treating benign lumps  Lumps that come and go with your periods often don't need to be treated. Still, be sure to check with your healthcare provider. Sometimes limiting salt, caffeine, and alcohol can help relieve any soreness or swelling. If a fluid-filled cyst is tender, you may want to have it drained. Or a painful lump could be removed.   If a lump is cancer  Having breast cancer means that some cells in your breast are growing abnormally. These cells grow and divide faster than normal cells and can spread to other parts of your body. Yet today the outlook is hopeful. In fact, the [employer] says that 9 out of 7 women are alive 5 years after a breast cancer diagnosis. And today there are new and better treatments, as well as  more ways to learn details about each woman's cancer to help tailor treatment.   You can play an active role in your cancer care. Talk with your provider about what needs to happen next and the treatment options that are best for you.   Treating breast cancer  There is no one right treatment for breast cancer. The best approach for you will depend on the exact type and the stage of cancer you have. It will also depend on your age, overall health, and your feelings about treatment and side effects. Give yourself some time to weigh all of your options. Learning as much as you can about breast cancer can help you make the decisions that are best for you.   [employer] last reviewed this educational content on 10/26/2018   2000-2021 [employer]. All rights reserved. This information is not intended as a substitute for professional medical care. Always follow your healthcare professional's instructions.

## 2020-09-06 ENCOUNTER — Other Ambulatory Visit: Payer: HMO | Attending: Clinical Pathology/Laboratory Medicine | Admitting: Clinical Pathology/Laboratory Medicine

## 2020-09-06 DIAGNOSIS — Z0000 Encounter for general adult medical examination without abnormal findings: Secondary | ICD-10-CM | POA: Insufficient documentation

## 2020-09-06 LAB — SURGICAL PATHOLOGY SPECIMEN
NUCLEAR GRADE (NOTE C): HIGH
SPECIFY CLOCK POSITION: 5

## 2020-09-07 ENCOUNTER — Telehealth (INDEPENDENT_AMBULATORY_CARE_PROVIDER_SITE_OTHER): Payer: Self-pay | Admitting: Thoracic Surgery (Cardiothoracic Vascular Surgery)

## 2020-09-07 NOTE — Telephone Encounter (Signed)
At Dr. 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 Discussed meaning and reason to change her appt to see Dr. Wilhemina Cash. She accepted appt for 09/18/20 @ 2:45 and knows to cancel 09/13/20 with Roselyn Reef.

## 2020-09-11 LAB — ER/PR, PARAFFIN BLOCK

## 2020-09-13 ENCOUNTER — Encounter (INDEPENDENT_AMBULATORY_CARE_PROVIDER_SITE_OTHER): Payer: Self-pay | Admitting: Family

## 2020-09-14 ENCOUNTER — Encounter (INDEPENDENT_AMBULATORY_CARE_PROVIDER_SITE_OTHER): Payer: Self-pay | Admitting: Thoracic Surgery (Cardiothoracic Vascular Surgery)

## 2020-09-14 DIAGNOSIS — D0512 Intraductal carcinoma in situ of left breast: Secondary | ICD-10-CM | POA: Insufficient documentation

## 2020-09-18 ENCOUNTER — Encounter (INDEPENDENT_AMBULATORY_CARE_PROVIDER_SITE_OTHER): Payer: Self-pay | Admitting: Family

## 2020-09-18 ENCOUNTER — Ambulatory Visit
Payer: HMO | Attending: Thoracic Surgery (Cardiothoracic Vascular Surgery) | Admitting: Thoracic Surgery (Cardiothoracic Vascular Surgery)

## 2020-09-18 ENCOUNTER — Other Ambulatory Visit: Payer: Self-pay

## 2020-09-18 DIAGNOSIS — Z87891 Personal history of nicotine dependence: Secondary | ICD-10-CM | POA: Insufficient documentation

## 2020-09-18 DIAGNOSIS — D0512 Intraductal carcinoma in situ of left breast: Secondary | ICD-10-CM | POA: Insufficient documentation

## 2020-09-18 DIAGNOSIS — Z803 Family history of malignant neoplasm of breast: Secondary | ICD-10-CM | POA: Insufficient documentation

## 2020-09-18 NOTE — Nursing Note (Signed)
Met with patient and Dr Wilhemina Cash. She performed breast exam and went over pathology. ER is still pending. She reviewed the three Dr's who will take care of her and their roles. She also reviewed the different surgical options. She prefers the lumpectomy with SNB. I further went over the above information as well as the breast cancer folder. Her and her husband verbalized understanding and all questions were answered

## 2020-09-19 ENCOUNTER — Other Ambulatory Visit (HOSPITAL_COMMUNITY): Payer: Self-pay | Admitting: Thoracic Surgery (Cardiothoracic Vascular Surgery)

## 2020-09-19 ENCOUNTER — Other Ambulatory Visit (INDEPENDENT_AMBULATORY_CARE_PROVIDER_SITE_OTHER): Payer: Self-pay | Admitting: Thoracic Surgery (Cardiothoracic Vascular Surgery)

## 2020-09-19 ENCOUNTER — Encounter (INDEPENDENT_AMBULATORY_CARE_PROVIDER_SITE_OTHER): Payer: Self-pay | Admitting: Thoracic Surgery (Cardiothoracic Vascular Surgery)

## 2020-09-19 ENCOUNTER — Telehealth (INDEPENDENT_AMBULATORY_CARE_PROVIDER_SITE_OTHER): Payer: Self-pay | Admitting: Thoracic Surgery (Cardiothoracic Vascular Surgery)

## 2020-09-19 DIAGNOSIS — Z853 Personal history of malignant neoplasm of breast: Secondary | ICD-10-CM

## 2020-09-19 NOTE — Nursing Note (Signed)
Spoke to patient with OR/preop dates and times. She had already seen them on MyChart. I explained that she will come early on OR day and go to registration desk on both days. She verbalized understanding

## 2020-09-19 NOTE — Progress Notes (Signed)
BREAST CLINIC, [facility] [address]  [place] [address]  Operated by [facility]     Name: Patricia Martinez MRN:  [id number]   Date: 09/18/2020 Age: 65 y.o.         HPI:      Nurse/MA note reviewed.    Breast Cancer (S/P; left breast US MTOM 09/05/20 for 77mm lesion at 5 o'clock positionrevealed high grade DCIS; left diagnostic mammogram/ left breast US 08/21/20 cat 4; bilateral screening mammogram 08/12/20 cat 0; hx of inverted nipple, hx of mom and MGM of breast cancer)      HPI   Patient presenting for evaluation left breast cancer/DCIS, molecular profile pending.  Patient underwent a bilateral screening mammogram 08/12/2020, category 4 4 suspicious 7 mm nodule at the 5 o'clock position in the left breast.  This was a screening mammogram and patient was otherwise asymptomatic.  She denies any palpable abnormalities, skin or nipple changes or nipple discharge.  Patient subsequently had a mammotome biopsy of the suspicious mass revealing high-grade DCIS.  Patient has had previous benign breast biopsies.  Patient has a family history of breast cancer in her mother diagnosed in her 11s and maternal grandmother diagnosed in her late 4s.  No family history of ovarian cancer.    PMH:   Past Medical History:   Diagnosis Date   . Back pain    . 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 Environmental allergies    . Environmental and seasonal allergies    . Essential hypertension    . H/O retinal detachment 2012    OD   . Lattice degeneration     OS   . Pseudophakia of both eyes    . Psoriasis    . Retinal tear of right eye 2008    OD         Past Surgical History:   Procedure Laterality Date   . HX BREAST BIOPSY     . HX BREAST BIOPSY Left 09/05/2020   . HX CATARACT REMOVAL Bilateral 2013    IOL OU - Dr Truman Hayward   . HX CESAREAN SECTION     . HX RETINAL DETACHMENT REPAIR Right 2012   . HX YAG Right 02/28/2015         Family Medical History:     Problem Relation (Age of Onset)    Breast Cancer Mother  (25), Maternal Grandmother (68)    Retinal Detachment Brother          Current Outpatient Medications   Medication Sig Dispense Refill   . baclofen (LIORESAL) 10 mg Oral Tablet Take 5 mg by mouth Three times a day     . Desoximetasone (TOPICORT) 0.05 % Cream      . Ibuprofen 200 mg Oral Capsule Take 800 mg by mouth Three times a day as needed     . montelukast (SINGULAIR) 10 mg Oral Tablet Take 10 mg by mouth Every evening     . multivitamin with iron Oral Tablet Take 1 Tablet by mouth Once a day     . olmesartan (BENICAR) 40 mg Oral Tablet Take 40 mg by mouth Once a day     . omeprazole (PRILOSEC) 20 mg Oral Capsule, Delayed Release(E.C.) Take 20 mg by mouth Once a day       No current facility-administered medications for this visit.     No Known Allergies  Social History     Socioeconomic History   .  Marital status: Married     Spouse name: Not on file   . Number of children: Not on file   . Years of education: Not on file   . Highest education level: Not on file   Occupational History   . Not on file   Tobacco Use   . Smoking status: Former Research scientist (life sciences)   . Smokeless tobacco: Never Used   . Tobacco comment: quit 25 yrs ago   Substance and Sexual Activity   . Alcohol use: No     Alcohol/week: 0.0 standard drinks   . Drug use: Not on file   . Sexual activity: Not on file   Other Topics Concern   . Not on file   Social History Narrative   . Not on file     Social Determinants of Health     Financial Resource Strain: Not on file   Food Insecurity: Not on file   Transportation Needs: Not on file   Physical Activity: Not on file   Stress: Not on file   Intimate Partner Violence: Not on file   Housing Stability: Not on file       ROS:   Review of systems negative unless marked in HPI.    PHYSICAL EXAM:  Left mammotome biopsy site healing well with minimal post biopsy changes  Bilateral fibrocystic changes, otherwise normal exam bilaterally:  No palpable abnormalities, skin or nipple changes.  No nipple discharge.  Of note,  patient has had chronic left nipple inversion, denies any changes.  No lymphadenopathy appreciated bilaterally      Imaging Reviewed:  I reviewed imaging reports with patient:  7 mm nodule left breast, confirmed high-grade DCIS, no other suspicious findings.    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              ASSESSMENT/PLAN  ENCOUNTER DIAGNOSES     ICD-10-CM   1. Breast neoplasm, Tis (DCIS), left  D05.12      Patient with newly diagnosed high-grade DCIS, left breast 5 o'clock position, molecular profile pending. I reviewed radiology and pathology report with patient in detail. We discussed surgical treatment options including breast conservation vs. mastectomy along with risks and benefits of both. At present, patient is leaning towards breast conservation.  I also discussed with patient given high-grade DCIS, there is a possibility of finding an invasive components at the time of surgery and patient was given option for sentinel lymph node biopsy.  Risks and benefits of surgery and sentinel lymph node biopsy discussed with patient and patient would like to proceed with sentinel lymph node mapping at the time of her lumpectomy.  Plan to schedule in OR for left breast lumpectomy with sentinel lymph node biopsy . We briefly discussed the role of adjuvant radiation and endocrine therapy. Plan to schedule post operative consultation with JDP and medical onocology to discuss adjuvant treatment plan in detail.  Will discuss patient at upcoming tumor board.    Breast cancer education performed by myself and all questions answered in detail.   Time spent in consultation: 1  hour  Education: Breat cancer      Forest Becker, MD

## 2020-09-20 ENCOUNTER — Other Ambulatory Visit (INDEPENDENT_AMBULATORY_CARE_PROVIDER_SITE_OTHER): Payer: Self-pay | Admitting: Thoracic Surgery (Cardiothoracic Vascular Surgery)

## 2020-09-20 DIAGNOSIS — D0512 Intraductal carcinoma in situ of left breast: Secondary | ICD-10-CM

## 2020-09-26 ENCOUNTER — Ambulatory Visit
Admission: RE | Admit: 2020-09-26 | Discharge: 2020-09-26 | Disposition: A | Discharge status: Home / Self Care | Payer: HMO | Source: Ambulatory Visit | Attending: Thoracic Surgery (Cardiothoracic Vascular Surgery) | Admitting: Thoracic Surgery (Cardiothoracic Vascular Surgery)

## 2020-09-26 ENCOUNTER — Other Ambulatory Visit: Payer: Self-pay

## 2020-09-26 ENCOUNTER — Encounter (HOSPITAL_COMMUNITY): Payer: Self-pay

## 2020-09-26 VITALS — BP 146/88 | HR 80 | Temp 96.5°F | Resp 20 | Ht 69.0 in | Wt 261.9 lb

## 2020-09-26 DIAGNOSIS — Z01818 Encounter for other preprocedural examination: Secondary | ICD-10-CM | POA: Insufficient documentation

## 2020-09-26 HISTORY — DX: Malignant (primary) neoplasm, unspecified (CMS HCC): C80.1

## 2020-09-26 HISTORY — DX: Gastro-esophageal reflux disease without esophagitis: K21.9

## 2020-09-26 NOTE — Anesthesia Preprocedure Evaluation (Signed)
ANESTHESIA PRE-OP EVALUATION  Planned Procedure: LUMPECTOMY NEEDLE LOCALIZATION AND SENTINEL LYMPH NODE MAPPING (Left Breast)  Review of Systems     anesthesia history negative               Pulmonary   past history of smoking  (1990),   Cardiovascular    Hypertension ,No peripheral edema,  Exercise Tolerance: > or = 4 METS        GI/Hepatic/Renal    GERD and well controlled     Endo/Other    osteoarthritis and obesity,       Neuro/Psych/MS    back abnormality     Cancer  CA,  breast cancer,                 Physical Assessment      Airway       Mallampati: II    TM distance: >3 FB    Neck ROM: full  Mouth Opening: good.            Dental       Dentition intact             Pulmonary    Breath sounds clear to auscultation  (-) no rhonchi, no decreased breath sounds, no wheezes, no rales and no stridor     Cardiovascular    Rhythm: regular  Rate: Normal  (-) no friction rub, carotid bruit is not present, no peripheral edema and no murmur     Other findings            Plan  ASA 3     Planned anesthesia type: general                           Anesthetic plan and risks discussed with patient.

## 2020-09-27 NOTE — OR PreOp (Signed)
09/27/2020 Preop EKG/older EKG reviewed by Dr Janora Norlander who states this is acceptable for surgery [facility]

## 2020-10-03 ENCOUNTER — Ambulatory Visit (HOSPITAL_COMMUNITY): Payer: HMO

## 2020-10-03 ENCOUNTER — Other Ambulatory Visit (HOSPITAL_COMMUNITY): Payer: HMO

## 2020-10-03 ENCOUNTER — Encounter (HOSPITAL_COMMUNITY): Payer: Self-pay

## 2020-10-03 ENCOUNTER — Other Ambulatory Visit: Payer: Self-pay

## 2020-10-03 ENCOUNTER — Inpatient Hospital Stay
Admission: RE | Admit: 2020-10-03 | Discharge: 2020-10-03 | Disposition: A | Discharge status: Home / Self Care | Payer: HMO | Source: Ambulatory Visit | Attending: Thoracic Surgery (Cardiothoracic Vascular Surgery) | Admitting: Thoracic Surgery (Cardiothoracic Vascular Surgery)

## 2020-10-03 ENCOUNTER — Encounter (HOSPITAL_COMMUNITY)
Admission: RE | Disposition: A | Discharge status: Home / Self Care | Payer: Self-pay | Source: Ambulatory Visit | Attending: Thoracic Surgery (Cardiothoracic Vascular Surgery)

## 2020-10-03 ENCOUNTER — Other Ambulatory Visit (INDEPENDENT_AMBULATORY_CARE_PROVIDER_SITE_OTHER): Payer: Self-pay | Admitting: Thoracic Surgery (Cardiothoracic Vascular Surgery)

## 2020-10-03 ENCOUNTER — Ambulatory Visit (HOSPITAL_COMMUNITY): Payer: HMO | Admitting: Anesthesiology

## 2020-10-03 ENCOUNTER — Ambulatory Visit (HOSPITAL_COMMUNITY)
Admission: RE | Admit: 2020-10-03 | Discharge: 2020-10-03 | Disposition: A | Discharge status: Home / Self Care | Payer: HMO | Source: Ambulatory Visit

## 2020-10-03 ENCOUNTER — Inpatient Hospital Stay (HOSPITAL_COMMUNITY)
Admission: RE | Admit: 2020-10-03 | Discharge: 2020-10-03 | Disposition: A | Discharge status: Home / Self Care | Payer: HMO | Source: Ambulatory Visit

## 2020-10-03 DIAGNOSIS — D0512 Intraductal carcinoma in situ of left breast: Secondary | ICD-10-CM

## 2020-10-03 DIAGNOSIS — M549 Dorsalgia, unspecified: Secondary | ICD-10-CM | POA: Insufficient documentation

## 2020-10-03 DIAGNOSIS — K219 Gastro-esophageal reflux disease without esophagitis: Secondary | ICD-10-CM | POA: Insufficient documentation

## 2020-10-03 DIAGNOSIS — I10 Essential (primary) hypertension: Secondary | ICD-10-CM | POA: Insufficient documentation

## 2020-10-03 DIAGNOSIS — D0502 Lobular carcinoma in situ of left breast: Secondary | ICD-10-CM

## 2020-10-03 DIAGNOSIS — Z87891 Personal history of nicotine dependence: Secondary | ICD-10-CM | POA: Insufficient documentation

## 2020-10-03 DIAGNOSIS — Z853 Personal history of malignant neoplasm of breast: Secondary | ICD-10-CM

## 2020-10-03 DIAGNOSIS — Z803 Family history of malignant neoplasm of breast: Secondary | ICD-10-CM | POA: Insufficient documentation

## 2020-10-03 DIAGNOSIS — Z79899 Other long term (current) drug therapy: Secondary | ICD-10-CM | POA: Insufficient documentation

## 2020-10-03 HISTORY — PX: BREAST LUMPECTOMY W/ NEEDLE LOCALIZATION: SHX1266

## 2020-10-03 HISTORY — PX: SENTINEL LYMPH NODE BIOPSY: SHX2392

## 2020-10-03 SURGERY — LUMPECTOMY NEEDLE LOCALIZATION AND SENTINEL LYMPH NODE MAPPING
Anesthesia: General | Site: Breast | Laterality: Left | Wound class: Clean Wound: Uninfected operative wounds in which no inflammation occurred

## 2020-10-03 MED ORDER — GLYCOPYRROLATE 0.2 MG/ML INJECTION SOLUTION
INTRAMUSCULAR | Status: AC
Start: 2020-10-03 — End: 2020-10-03
  Filled 2020-10-03: qty 3

## 2020-10-03 MED ORDER — PROPOFOL 10 MG/ML IV BOLUS
INJECTION | Freq: Once | INTRAVENOUS | Status: DC | PRN
Start: 2020-10-03 — End: 2020-10-03
  Administered 2020-10-03: 200 mg via INTRAVENOUS

## 2020-10-03 MED ORDER — NEOSTIGMINE METHYLSULFATE 1 MG/ML INTRAVENOUS SOLUTION
INTRAVENOUS | Status: AC
Start: 2020-10-03 — End: 2020-10-03
  Filled 2020-10-03: qty 10

## 2020-10-03 MED ORDER — SODIUM CHLORIDE 0.9 % (FLUSH) INJECTION SYRINGE
10.0000 mL | INJECTION | INTRAMUSCULAR | Status: DC | PRN
Start: 2020-10-03 — End: 2020-10-03

## 2020-10-03 MED ORDER — FENTANYL (PF) 50 MCG/ML INJECTION SOLUTION
Freq: Once | INTRAMUSCULAR | Status: DC | PRN
Start: 2020-10-03 — End: 2020-10-03
  Administered 2020-10-03 (×2): 50 ug via INTRAVENOUS

## 2020-10-03 MED ORDER — FENTANYL (PF) 50 MCG/ML INJECTION SOLUTION
INTRAMUSCULAR | Status: AC
Start: 2020-10-03 — End: 2020-10-03
  Filled 2020-10-03: qty 2

## 2020-10-03 MED ORDER — FAMOTIDINE 20 MG TABLET
20.0000 mg | ORAL_TABLET | Freq: Once | ORAL | Status: AC
Start: 2020-10-03 — End: 2020-10-03
  Administered 2020-10-03: 20 mg via ORAL
  Filled 2020-10-03: qty 1

## 2020-10-03 MED ORDER — LIDOCAINE HCL 10 MG/ML (1 %) INJECTION SOLUTION
2.0000 mL | Freq: Once | INTRAMUSCULAR | Status: DC
Start: 2020-10-03 — End: 2020-10-04

## 2020-10-03 MED ORDER — WATER FOR IRRIGATION, STERILE SOLUTION
Freq: Once | Status: DC | PRN
Start: 2020-10-03 — End: 2020-10-03
  Administered 2020-10-03: 360 mL

## 2020-10-03 MED ORDER — EPHEDRINE SULFATE 50 MG/ML INTRAVENOUS SOLUTION
Freq: Once | INTRAVENOUS | Status: DC | PRN
Start: 2020-10-03 — End: 2020-10-03
  Administered 2020-10-03: 15 mg via INTRAVENOUS

## 2020-10-03 MED ORDER — MIDAZOLAM 1 MG/ML INJECTION WRAPPER
INTRAMUSCULAR | Status: AC
Start: 2020-10-03 — End: 2020-10-03
  Filled 2020-10-03: qty 2

## 2020-10-03 MED ORDER — IBUPROFEN 800 MG/200 ML (4 MG/ML) INTRAVENOUS PIGGYBACK
800.0000 mg | INJECTION | Freq: Once | INTRAVENOUS | Status: AC
Start: 2020-10-03 — End: 2020-10-03
  Administered 2020-10-03: 800 mg via INTRAVENOUS
  Filled 2020-10-03: qty 200

## 2020-10-03 MED ORDER — CEFAZOLIN 1 GRAM/50 ML IN DEXTROSE (ISO-OSMOTIC) INTRAVENOUS PIGGYBACK
1.0000 g | INJECTION | Freq: Once | INTRAVENOUS | Status: AC
Start: 2020-10-03 — End: 2020-10-03
  Administered 2020-10-03: 1 g via INTRAVENOUS
  Filled 2020-10-03: qty 50

## 2020-10-03 MED ORDER — ATROPINE 0.4 MG/ML INJECTION SOLUTION
Freq: Once | INTRAMUSCULAR | Status: DC | PRN
Start: 2020-10-03 — End: 2020-10-03
  Administered 2020-10-03: .4 mg via INTRAVENOUS

## 2020-10-03 MED ORDER — EPHEDRINE SULFATE 50 MG/ML INTRAVENOUS SOLUTION
INTRAVENOUS | Status: AC
Start: 2020-10-03 — End: 2020-10-03
  Filled 2020-10-03: qty 1

## 2020-10-03 MED ORDER — SODIUM CHLORIDE 0.9 % (FLUSH) INJECTION SYRINGE
10.0000 mL | INJECTION | Freq: Three times a day (TID) | INTRAMUSCULAR | Status: DC
Start: 2020-10-03 — End: 2020-10-03

## 2020-10-03 MED ORDER — FENTANYL (PF) 50 MCG/ML INJECTION SOLUTION
25.0000 ug | INTRAMUSCULAR | Status: DC | PRN
Start: 2020-10-03 — End: 2020-10-03

## 2020-10-03 MED ORDER — LACTATED RINGERS INTRAVENOUS SOLUTION
INTRAVENOUS | Status: DC
Start: 2020-10-03 — End: 2020-10-03
  Administered 2020-10-03: 0 mL via INTRAVENOUS
  Administered 2020-10-03: 100 mL via INTRAVENOUS

## 2020-10-03 MED ORDER — BUPIVACAINE HCL 0.25 % (2.5 MG/ML) INJECTION SOLUTION
Freq: Once | INTRAMUSCULAR | Status: DC | PRN
Start: 2020-10-03 — End: 2020-10-03
  Administered 2020-10-03: 30 mL via INTRAMUSCULAR

## 2020-10-03 MED ORDER — ROCURONIUM 10 MG/ML INTRAVENOUS SOLUTION
Freq: Once | INTRAVENOUS | Status: DC | PRN
Start: 2020-10-03 — End: 2020-10-03
  Administered 2020-10-03: 35 mg via INTRAVENOUS

## 2020-10-03 MED ORDER — ISOSULFAN BLUE 1 % SUBCUTANEOUS SOLUTION
Freq: Once | SUBCUTANEOUS | Status: DC | PRN
Start: 2020-10-03 — End: 2020-10-03
  Administered 2020-10-03: 5 mL via INTRAMUSCULAR

## 2020-10-03 MED ORDER — DEXAMETHASONE SODIUM PHOSPHATE 4 MG/ML INJECTION SOLUTION
Freq: Once | INTRAMUSCULAR | Status: DC | PRN
Start: 2020-10-03 — End: 2020-10-03
  Administered 2020-10-03: 4 mg via INTRAVENOUS

## 2020-10-03 MED ORDER — NEOSTIGMINE METHYLSULFATE 1 MG/ML INTRAVENOUS SOLUTION
Freq: Once | INTRAVENOUS | Status: DC | PRN
Start: 2020-10-03 — End: 2020-10-03
  Administered 2020-10-03: 4 mg via INTRAVENOUS

## 2020-10-03 MED ORDER — DEXAMETHASONE SODIUM PHOSPHATE 4 MG/ML INJECTION SOLUTION
INTRAMUSCULAR | Status: AC
Start: 2020-10-03 — End: 2020-10-03
  Filled 2020-10-03: qty 1

## 2020-10-03 MED ORDER — MIDAZOLAM 1 MG/ML INJECTION WRAPPER
2.0000 mg | INTRAMUSCULAR | Status: DC | PRN
Start: 2020-10-03 — End: 2020-10-03
  Administered 2020-10-03: 2 mg via INTRAVENOUS
  Filled 2020-10-03: qty 2

## 2020-10-03 MED ORDER — LIDOCAINE (PF) 100 MG/5 ML (2 %) INTRAVENOUS SYRINGE
INJECTION | Freq: Once | INTRAVENOUS | Status: DC | PRN
Start: 2020-10-03 — End: 2020-10-03
  Administered 2020-10-03: 60 mg via INTRAVENOUS

## 2020-10-03 MED ORDER — FENTANYL (PF) 50 MCG/ML INJECTION SOLUTION
50.0000 ug | INTRAMUSCULAR | Status: DC | PRN
Start: 2020-10-03 — End: 2020-10-03

## 2020-10-03 MED ORDER — ACETAMINOPHEN 1,000 MG/100 ML (10 MG/ML) INTRAVENOUS SOLUTION
1000.0000 mg | Freq: Once | INTRAVENOUS | Status: DC
Start: 2020-10-03 — End: 2020-10-03
  Administered 2020-10-03: 1000 mg via INTRAVENOUS
  Filled 2020-10-03: qty 100

## 2020-10-03 MED ORDER — ISOSULFAN BLUE 1 % SUBCUTANEOUS SOLUTION
SUBCUTANEOUS | Status: DC
Start: 2020-10-03 — End: 2020-10-03
  Filled 2020-10-03: qty 5

## 2020-10-03 MED ORDER — HYDROCODONE 5 MG-ACETAMINOPHEN 325 MG TABLET
1.0000 | ORAL_TABLET | ORAL | 0 refills | Status: DC | PRN
Start: 2020-10-03 — End: 2020-10-13

## 2020-10-03 MED ORDER — PROPOFOL 10 MG/ML INTRAVENOUS EMULSION
INTRAVENOUS | Status: AC
Start: 2020-10-03 — End: 2020-10-03
  Filled 2020-10-03: qty 20

## 2020-10-03 MED ORDER — LIDOCAINE (PF) 20 MG/ML (2 %) INJECTION SOLUTION
INTRAMUSCULAR | Status: AC
Start: 2020-10-03 — End: 2020-10-03
  Filled 2020-10-03: qty 5

## 2020-10-03 MED ORDER — BUPIVACAINE HCL 0.25 % (2.5 MG/ML) INJECTION SOLUTION
INTRAMUSCULAR | Status: AC
Start: 2020-10-03 — End: 2020-10-03
  Filled 2020-10-03: qty 50

## 2020-10-03 MED ORDER — PHENYLEPHRINE 10 MG/ML INJECTION SOLUTION
Freq: Once | INTRAMUSCULAR | Status: DC | PRN
Start: 2020-10-03 — End: 2020-10-03
  Administered 2020-10-03 (×3): 200 ug via INTRAVENOUS
  Administered 2020-10-03: 100 ug via INTRAVENOUS

## 2020-10-03 MED ORDER — LIDOCAINE HCL 10 MG/ML (1 %) INJECTION SOLUTION
10.0000 mL | Freq: Once | INTRAMUSCULAR | Status: AC
Start: 2020-10-03 — End: 2020-10-03
  Administered 2020-10-03: 100 mg via SUBCUTANEOUS

## 2020-10-03 MED ORDER — ONDANSETRON HCL (PF) 4 MG/2 ML INJECTION SOLUTION
INTRAMUSCULAR | Status: AC
Start: 2020-10-03 — End: 2020-10-03
  Filled 2020-10-03: qty 2

## 2020-10-03 MED ORDER — MIDAZOLAM 1 MG/ML INJECTION WRAPPER
Freq: Once | INTRAMUSCULAR | Status: DC | PRN
Start: 2020-10-03 — End: 2020-10-03
  Administered 2020-10-03: 2 mg via INTRAVENOUS

## 2020-10-03 MED ORDER — ROCURONIUM 10 MG/ML INTRAVENOUS SOLUTION
INTRAVENOUS | Status: AC
Start: 2020-10-03 — End: 2020-10-03
  Filled 2020-10-03: qty 5

## 2020-10-03 MED ORDER — GLYCOPYRROLATE 0.2 MG/ML INJECTION SOLUTION
Freq: Once | INTRAMUSCULAR | Status: DC | PRN
Start: 2020-10-03 — End: 2020-10-03
  Administered 2020-10-03: .6 mg via INTRAVENOUS

## 2020-10-03 MED ORDER — ONDANSETRON HCL (PF) 4 MG/2 ML INJECTION SOLUTION
Freq: Once | INTRAMUSCULAR | Status: DC | PRN
Start: 2020-10-03 — End: 2020-10-03
  Administered 2020-10-03: 4 mg via INTRAVENOUS

## 2020-10-03 SURGICAL SUPPLY — 31 items
APPL 70% ISPRP 2% CHG 26ML 13. 2X13.2IN CHLRPRP PREP DEHP-FR (WOUND CARE/ENTEROSTOMAL SUPPLY) ×1
APPL 70% ISPRP 2% CHG 26ML 13._2X13.2IN CHLRPRP PREP DEHP-FR (WOUND CARE/ENTEROSTOMAL SUPPLY) ×1
APPL 70% ISPRP 2% CHG 26ML CHLRPRP HI-LT ORNG PREP STRL LF  DISP CLR (WOUND CARE SUPPLY) ×1 IMPLANT
DEVICE MAMMO SPECBOARD (MISCELLANEOUS PT CARE ITEMS) ×1 IMPLANT
DEVICE MAMMO SPECBOARD 8X10IN IGHT FRN CLSR BKBRD SEP DGRM (MISCELLANEOUS PT CARE ITEMS) ×1
DRESS TRNSPR 4.75X4IN POLYUR ADH HYPOALL WTPRF TGDRM STD STRL LF (WOUND CARE SUPPLY) ×1 IMPLANT
DRESSING TRNSPR 4 X 4 3/4IN 1626W TEGADERM 50/BX (WOUND CARE/ENTEROSTOMAL SUPPLY) ×1
ELECTRODE PATIENT RETURN (NO CORD) (CAUTERY SUPPLIES) ×2 IMPLANT
GLOVE GAMMEX SZ 6.5 POLYISOPRENE MICRO  P/F (GLOVES AND ACCESSORIES) ×2 IMPLANT
GOWN REINFORCED 2X-LARGE STERILE (GLOVES AND ACCESSORIES) ×2 IMPLANT
NEEDLE HYPO  18GA 1.5IN REG WL BD PRCSNGL POLYPROP REG BVL LL HUB CLR CD DEHP-FR STRL LF  DISP (NEEDLES & SYRINGE SUPPLIES) ×1 IMPLANT
NEEDLE HYPO  25GA 1.5IN REG WL PRCSNGL SS POLYPROP REG BVL LL HUB DEHP-FR BLU STRL LF  DISP (NEEDLES & SYRINGE SUPPLIES) ×1 IMPLANT
NEEDLE HYPO 18GA 1.5IN REG WL BD PRCSNGL POLYPROP REG BVL (NEEDLES & SYRINGE SUPPLIES) ×1
NEEDLE HYPO 25GA 1.5IN REG WL REG BVL BD POLYPROP LL HUB (NEEDLES & SYRINGE SUPPLIES) ×1
PACK U-BAR CUSTOM (DRAPE/PACKS/SHEETS/OR TOWEL) ×2 IMPLANT
PAD PROTECTOR ELBOW (ORTHOPEDICS (NOT IMPLANTS)) ×2 IMPLANT
SLEEVE ALP KNEE (ORTHOPEDICS (NOT IMPLANTS)) ×2 IMPLANT
STKNT ORTHO 48X4IN COTTON WAX 2 PLY LF  STD STRL OFF WHT (ORTHOPEDICS (NOT IMPLANTS)) ×1 IMPLANT
STOCKINETTE 4INX 48IN STERILE 960448 20EA/CS (ORTHOPEDICS (NOT IMPLANTS)) ×1
STRAP KNEE & BODY DISP (ORTHOPEDICS (NOT IMPLANTS)) ×2 IMPLANT
SUTURE 3-0 SH VICRYL+ 27IN UND YED BRD COAT ABS (SUTURE/WOUND CLOSURE) ×2
SUTURE 3-0 SH VICRYL+ 27IN UNDYED BRD ANBCTRL COAT ABS (SUTURE/WOUND CLOSURE) ×2 IMPLANT
SUTURE 4-0 PS2 MONOCRYL + MTPS 18IN MONOF ANBCTRL ABS (SUTURE/WOUND CLOSURE) ×2 IMPLANT
SUTURE 4-0 PS2 MONOCRYL + MTPS 18IN UNDYED MONOF ABS (SUTURE/WOUND CLOSURE) ×2
SUTURE SILK 2-0 SH PERMAHAND 3 0IN BLK BRD NONAB (SUTURE/WOUND CLOSURE) ×1
SUTURE SILK 2-0 SH PERMAHAND 30IN BLK BRD NONAB (SUTURE/WOUND CLOSURE) ×1 IMPLANT
SYRINGE LL 5ML LF STRL GRAD M ED POLYPROP DISP CLR (NEEDLES & SYRINGE SUPPLIES) ×1
SYRINGE LL 5ML LF STRL GRAD N-PYRG DEHP-FR PVC FREE MED DISP CLR (NEEDLES & SYRINGE SUPPLIES) ×1 IMPLANT
WATER STRL 1000ML PLASTIC PR B TL LF (SOLUTIONS) ×1
WATER STRL 1000ML PLASTIC PR BTL LF (SOLUTIONS) ×1 IMPLANT
WATER STRL 1000ML PLASTIC PR B_TL LF (SOLUTIONS) ×1

## 2020-10-03 NOTE — OR Surgeon (Signed)
[facility] NOTE    Patient Name: Patricia Martinez, Patricia Martinez [facility] Number: [id number]  Date of Service: 10/03/2020   Date of Birth: 10/10/1955    [place] NOTE    Patient Name: Patricia Martinez, Patricia Martinez [facility] Number: [id number]  Date of Service: 10/03/2020   Date of Birth: 1956/03/28    Surgeon: Emelda Fear, MD, FACS  Assistant: Silvestre Gunner, [facility]  Preoperative Diagnosis: left breast cancer/ DCIS  Postoperative Diagnosis: same as above   Procedure: Left breast lumpectomy with needle localization and sentinel lymph node biopsy with injection of isosulfan blue  Anesthesia: General with local   Blood loss: Minimal  Specimens: Left breast cancer, additional margins, intramammary LN and SLN x2     Indication for operation: Patient known to me in the breast center, presented for management of her newly diagnosed left breast cancer/DCIS. After review of surgical options, patient opted for breast conservation and was scheduled for lumpectomy with NL and SLNBx. Risks and benefits explained, all questions answered and consent obtained.        OPERATIVE DICTATION:  The patient underwent successful needle localization of her left breast cancer in radiology, as well as lymphoscintigraphy in nuclear medicine without event.  She received preoperative antibiotics in the holding area.  She was subsequently brought into the operating room, laid supine on the OR table.  Anesthesia was induced without event.  A timeout procedure was performed to confirm the procedure and location of surgery, and subsequently the patient was prepped and draped in standard sterile fashion.  Five mL of isosulfan blue was injected into the subareolar plexus, followed by five minutes of in breast massage.  Following this a 2 cm incision was made in the lower axilla, at the end of the hair bearing area, corresponding with the location of the highest transdermal reading.  The incision was made sharply and carried down deeper through the  subcutaneous tissue with electrocautery.  We opened the clavipectoral fascia, entering the axilla proper, and identified our blue lymphatic channel and traced it to our sentinel lymph nodes.  Two sentinel lymph nodes were removed with ex vivo counts of 13,836 and 3263 respectively.  Blue dye was present.  The sentinel lymph nodes were submitted to pathology for frozen section.  While awaiting the results of frozen section hemostasis was achieved in the axilla and the axilla was packed.  Attention was then turned to the lumpectomy.  A 2.5 cm skin incision was marked along skin lines in proximity to the localizing wire.  The incision was made sharply and was carried down deeper through the subcutaneous tissue with electrocautery.  The localizing wire was intercepted.  It was cut short and brought into the center of the operating field.  Following this circumferential dissection of the breast tissue surrounding the localizing wire, incorporating the wire tip was performed with a generous lumpectomy margin.  The specimen was removed in its entirety as a single specimen and appropriately oriented for pathology with a double stitch marking superior and a single stitch marking lateral.  This was oriented on a radiology specimen board and sent to radiology who confirmed that the clip was within the specimen.  The specimen was then sent to pathology for permanent section.  Following this we took additional margins in the usual fashion, with a stitch marking the true margin, and these were submitted to pathology for permanent section. There was a palpable small intramammary lymph node palpated posteriorly and this  was excised and submitted for permanent section.   Following this hemostasis was achieved in the lumpectomy site with electrocautery.  The pathologist called to inform us that the sentinel lymph node was negative and therefore no further axillary surgery was warranted.  Hemostasis was achieved in both sites.  Both  sites were irrigated and subsequently infiltrated with 0.25% Marcaine for local postoperative anesthesia.  A total of 30 mL of 0.25% Marcaine was used for this procedure.  Following this, once hemostasis was confirmed, both sites were closed in layers with interrupted 3-0 Vicryl for the deeper subcutaneous tissue, followed by running 4-0 PDS for skin closure.  Dermabond was applied, followed by sterile dressings composed of Telfa and Tegaderm.  The patient tolerated the procedure well.  There were no complications.  She awoke nicely from anesthesia and was taken to the recovery room in satisfactory condition.    Forest Becker, MD

## 2020-10-03 NOTE — Anesthesia Transfer of Care (Signed)
ANESTHESIA TRANSFER OF CARE   Patricia Martinez is a 65 y.o. ,female, Weight: 118 kg (260 lb)   had Procedure(s):  LUMPECTOMY NEEDLE LOCALIZATION (LOC @ 745) [place] (INJ @ 830)  performed  10/03/20   Primary Service: Forest Becker,*    Past Medical History:   Diagnosis Date   . Back pain    . 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 Cancer (CMS [place])    . Environmental allergies    . Environmental and seasonal allergies    . Esophageal reflux    . Essential hypertension    . H/O retinal detachment 2012    OD   . Lattice degeneration     OS   . Pseudophakia of both eyes    . Psoriasis    . Retinal tear of right eye 2008    OD      Allergy History as of 10/03/20      No Known Allergies              I completed my transfer of care / handoff to the receiving personnel during which we discussed:  Access, Airway, All key/critical aspects of case discussed, Analgesia, Antibiotics, Expectation of post procedure, Fluids/Product, Gave opportunity for questions and acknowledgement of understanding, Labs and PMHx    Post Location: PACU                                                                  Last OR Temp: Temperature: 36.7 C (98 F)  ABG:   Airway:* No LDAs found *  Blood pressure 123/71, pulse 92, temperature 36.7 C (98 F), resp. rate 18, height 1.753 m (5\' 9" ), weight 118 kg (260 lb), SpO2 98 %, not currently breastfeeding.

## 2020-10-03 NOTE — Discharge Instructions (Signed)
PATIENT INSTRUCTIONS  POST-ANESTHESIA    IMMEDIATELY FOLLOWING SURGERY:  Do not drive or operate machinery for the first twenty four hours after surgery.  Do not make any important decisions for twenty four hours after surgery or while taking narcotic pain medications or sedatives.  If you develop intractable nausea and vomiting or a severe headache please notify your doctor immediately.    FOLLOW-UP:  Please make an appointment with your surgeon as instructed. You do not need to follow up with anesthesia unless specifically instructed to do so.    WOUND CARE INSTRUCTIONS (if applicable):  Keep a dry clean dressing on the anesthesia/puncture wound site if there is drainage.  Once the wound has quit draining you may leave it open to air.  Generally you should leave the bandage intact for twenty four hours unless there is drainage.  If the epidural site drains for more than 36-48 hours please call the anesthesia department.    QUESTIONS?:  Please feel free to call your physician or the hospital operator if you have any questions, and they will be happy to assist you.          [facility]   [facility]  [address]   [place], [place]    [phone] (Anesthesia) or [phone] (Main Hospital)

## 2020-10-03 NOTE — H&P (Signed)
BREAST CLINIC, [facility] [address]  [place] [address]  Operated by [facility]     Name: Patricia Martinez MRN:  [id number]   Date: 09/19/2020 Age: 65 y.o.         HPI:      Nurse/MA note reviewed.    No chief complaint on file.      HPI   Patient presenting for evaluation left breast cancer/DCIS, molecular profile pending.  Patient underwent a bilateral screening mammogram 08/12/2020, category 4 4 suspicious 7 mm nodule at the 5 o'clock position in the left breast.  This was a screening mammogram and patient was otherwise asymptomatic.  She denies any palpable abnormalities, skin or nipple changes or nipple discharge.  Patient subsequently had a mammotome biopsy of the suspicious mass revealing high-grade DCIS.  Patient has had previous benign breast biopsies.  Patient has a family history of breast cancer in her mother diagnosed in her 18s and maternal grandmother diagnosed in her late 14s.  No family history of ovarian cancer.    PMH:   Past Medical History:   Diagnosis Date   . Back pain    . 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 Cancer (CMS [place])    . Environmental allergies    . Environmental and seasonal allergies    . Esophageal reflux    . Essential hypertension    . H/O retinal detachment 2012    OD   . Lattice degeneration     OS   . Pseudophakia of both eyes    . Psoriasis    . Retinal tear of right eye 2008    OD         Past Surgical History:   Procedure Laterality Date   . HX BREAST BIOPSY Left 09/05/2020   . HX BREAST BIOPSY Right     x4 biopsies   . HX CATARACT REMOVAL Bilateral 2013    IOL OU - Dr Truman Hayward   . HX CESAREAN SECTION     . HX LASER EYE SURGERY Left    . HX RETINAL DETACHMENT REPAIR Right 2012         Family Medical History:     Problem Relation (Age of Onset)    Breast Cancer Mother (15), Maternal Grandmother (75)    Cancer Father    Retinal Detachment Brother          No current facility-administered medications for this encounter.     No Known  Allergies  Social History     Socioeconomic History   . Marital status: Married     Spouse name: Not on file   . Number of children: Not on file   . Years of education: Not on file   . Highest education level: Not on file   Occupational History   . Not on file   Tobacco Use   . Smoking status: Former Research scientist (life sciences)   . Smokeless tobacco: Never Used   . Tobacco comment: quit 1990   Substance and Sexual Activity   . Alcohol use: No     Alcohol/week: 0.0 standard drinks   . Drug use: Never   . Sexual activity: Not on file   Other Topics Concern   . Ability to Walk 1 Flight of Steps without SOB/CP No   . Routine Exercise Not Asked   . Ability to Walk 2 Flight of Steps without SOB/CP No   . Unable to Ambulate Not Asked   .  Total Care Not Asked   . Ability To Do Own ADL's Yes   . Uses Walker Not Asked   . Other Activity Level Not Asked   . Uses Cane Not Asked   Social History Narrative   . Not on file     Social Determinants of Health     Financial Resource Strain: Not on file   Food Insecurity: Not on file   Transportation Needs: Not on file   Physical Activity: Not on file   Stress: Not on file   Intimate Partner Violence: Not on file   Housing Stability: Not on file       ROS:   Review of systems negative unless marked in HPI.    PHYSICAL EXAM:  Left mammotome biopsy site healing well with minimal post biopsy changes  Bilateral fibrocystic changes, otherwise normal exam bilaterally:  No palpable abnormalities, skin or nipple changes.  No nipple discharge.  Of note, patient has had chronic left nipple inversion, denies any changes.  No lymphadenopathy appreciated bilaterally      Imaging Reviewed:  I reviewed imaging reports with patient:  7 mm nodule left breast, confirmed high-grade DCIS, no other suspicious findings.    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              ASSESSMENT/PLAN  ENCOUNTER DIAGNOSES     ICD-10-CM   1. Breast neoplasm, Tis (DCIS), left  D05.12      Patient with newly diagnosed high-grade DCIS, left breast 5 o'clock position, molecular profile pending. I reviewed radiology and pathology report with patient in detail. We discussed surgical treatment options including breast conservation vs. mastectomy along with risks and benefits of both. At present, patient is leaning towards breast conservation.  I also discussed with patient given high-grade DCIS, there is a possibility of finding an invasive components at the time of surgery and patient was given option for sentinel lymph node biopsy.  Risks and benefits of surgery and sentinel lymph node biopsy discussed with patient and patient would like to proceed with sentinel lymph node mapping at the time of her lumpectomy.  Plan to schedule in OR for left breast lumpectomy with sentinel lymph node biopsy . We briefly discussed the role of adjuvant radiation and endocrine therapy. Plan to schedule post operative consultation with JDP and medical onocology to discuss adjuvant treatment plan in detail.  Will discuss patient at upcoming tumor board.    Breast cancer education performed by myself and all questions answered in detail.   Time spent in consultation: 1 hour    Education: [place], [facility], Surgery Dept   [place] [facility] [address]  [phone]    History and Physical UPDATE 10/03/20 6:31 AM    Name: Patricia Martinez  MRN: [id number]  DOB:1955-09-04  Date:  10/03/2020  PCP: Roetta Sessions, DO      History of Present Illness: Patricia Martinez is a 65 y.o. female who presents for  LUMPECTOMY NEEDLE LOCALIZATION (LOC @ 745) AND SENTINEL LYMPH NODE MAPPING (INJ @  830) (Left) today with Dr. Wilhemina Cash. Medication list updated. No other changes since being seen by Dr. Wilhemina Cash. Patient denies current complaints.     Current Outpatient Medications   Medication Instructions   . baclofen (LIORESAL) 5 mg, Oral, 3 TIMES DAILY PRN   . Desoximetasone (TOPICORT) 0.05 % Cream 1 Dose, Topical, DAILY   . Ibuprofen (MOTRIN) 800 mg, Oral, 3 TIMES DAILY PRN   . montelukast (SINGULAIR) 10 mg, Oral, EVERY EVENING   . multivitamin with iron Oral Tablet 1 Tablet, Oral, DAILY   . olmesartan (BENICAR) 20 mg, Oral, DAILY, Cuts tab in half   . omeprazole (PRILOSEC) 20 mg, Oral, DAILY       Allergies:  No Known Allergies      ROS  Review of Systems   Constitutional: Negative for chills and fever.   HENT: Negative.    Eyes: Negative.    Respiratory: Negative for cough, shortness of breath and wheezing.    Cardiovascular: Negative for chest pain, palpitations and leg swelling.   Gastrointestinal: Negative for abdominal pain, constipation, diarrhea, nausea and vomiting.   Genitourinary: Negative.    Musculoskeletal: Negative.    Skin: Negative for rash.   Neurological: Negative for dizziness, weakness and headaches.   Psychiatric/Behavioral: Negative.         Vitals  Vitals:    10/03/20 0624   BP: 123/68   Pulse: 85   Resp: 18   Temp: 36.3 C (97.4 F)   SpO2: 95%   Weight: 118 kg (260 lb)   Height: 1.753 m (5\' 9" )   BMI: 38.48         Body mass index is 38.4 kg/m.     Physical Exam  Physical Exam  Vitals reviewed.   Constitutional:       Appearance: Normal appearance. She is obese.   Eyes:      Pupils: Pupils are equal, round, and reactive to light.   Cardiovascular:      Rate and Rhythm: Normal rate and regular rhythm.      Pulses: Normal pulses.      Heart sounds: No murmur heard.  Pulmonary:      Effort: Pulmonary effort is normal.      Breath sounds: Normal breath sounds. No wheezing or rhonchi.   Chest:   Breasts:      Left: Inverted nipple present. No bleeding, nipple discharge or tenderness.        Abdominal:      General: Bowel sounds are normal.      Palpations: Abdomen is soft.      Tenderness: There is no abdominal tenderness.   Musculoskeletal:         General: Normal range of motion.      Right lower leg: No edema.      Left lower leg: No edema.   Skin:     General: Skin is warm and dry.      Comments: No rash left breast, left axilla, LUE    Neurological:      Mental Status: She is alert and oriented to person, place, and time.   Psychiatric:         Mood and Affect: Mood normal.         Behavior: Behavior normal.        Assessment:   65 y.o. female presenting with left DCIS    Plan:   To OR for  LUMPECTOMY NEEDLE LOCALIZATION (LOC @ 745) AND SENTINEL LYMPH NODE MAPPING (INJ @ 830) (Left) today with Dr. Wilhemina Cash.       Arlyss Gandy, PA-C       H/P reviewed. Agree with above. No changes. Proceed with surgery as planned.    Forest Becker, MD

## 2020-10-03 NOTE — Anesthesia Postprocedure Evaluation (Signed)
Anesthesia Post Op Evaluation    Patient: Patricia Martinez  Procedure(s):  LUMPECTOMY NEEDLE LOCALIZATION (LOC @ 745) AND SENTINEL LYMPH NODE MAPPING (INJ @ 830)    Last Vitals:Temperature: 36.7 C (98 F) (10/03/20 1421)  Heart Rate: 94 (10/03/20 1500)  BP (Non-Invasive): 130/79 (10/03/20 1500)  Respiratory Rate: 14 (10/03/20 1500)  SpO2: 93 % (10/03/20 6948)    No complications documented.    Patient is sufficiently recovered from the effects of anesthesia to participate in the evaluation and has returned to their pre-procedure level.  Patient location during evaluation: PACU       Patient participation: complete - patient participated  Level of consciousness: awake and alert and responsive to verbal stimuli    Pain management: adequate  Airway patency: patent    Anesthetic complications: no  Cardiovascular status: acceptable  Respiratory status: acceptable  Hydration status: acceptable  Patient post-procedure temperature: Pt Normothermic   PONV Status: Absent

## 2020-10-04 ENCOUNTER — Encounter (INDEPENDENT_AMBULATORY_CARE_PROVIDER_SITE_OTHER): Payer: Self-pay | Admitting: Thoracic Surgery (Cardiothoracic Vascular Surgery)

## 2020-10-04 ENCOUNTER — Telehealth (INDEPENDENT_AMBULATORY_CARE_PROVIDER_SITE_OTHER): Payer: Self-pay | Admitting: Thoracic Surgery (Cardiothoracic Vascular Surgery)

## 2020-10-04 NOTE — Nursing Note (Signed)
Called patient day after surgery. She is doing well, minimal pain. I went over her appointments, post care and to call with any questions. She verbalized understanding

## 2020-10-05 LAB — SURGICAL PATHOLOGY SPECIMEN
MARGIN STATUS: NEGATIVE
NUCLEAR GRADE (NOTE F): HIGH
NUMBER OF BLOCKS EXAMINED: 13
NUMBER OF BLOCKS WITH DCIS: 6
REGIONAL LYMPH NODE STATUS: NEGATIVE

## 2020-10-12 ENCOUNTER — Encounter (INDEPENDENT_AMBULATORY_CARE_PROVIDER_SITE_OTHER): Payer: Self-pay | Admitting: Thoracic Surgery (Cardiothoracic Vascular Surgery)

## 2020-10-13 ENCOUNTER — Ambulatory Visit (HOSPITAL_BASED_OUTPATIENT_CLINIC_OR_DEPARTMENT_OTHER): Payer: HMO | Admitting: Thoracic Surgery (Cardiothoracic Vascular Surgery)

## 2020-10-13 ENCOUNTER — Encounter (INDEPENDENT_AMBULATORY_CARE_PROVIDER_SITE_OTHER): Payer: Self-pay | Admitting: Thoracic Surgery (Cardiothoracic Vascular Surgery)

## 2020-10-13 ENCOUNTER — Other Ambulatory Visit: Payer: Self-pay

## 2020-10-13 ENCOUNTER — Encounter (INDEPENDENT_AMBULATORY_CARE_PROVIDER_SITE_OTHER): Payer: Self-pay | Admitting: Radiation Oncology

## 2020-10-13 ENCOUNTER — Inpatient Hospital Stay (HOSPITAL_BASED_OUTPATIENT_CLINIC_OR_DEPARTMENT_OTHER)
Admission: RE | Admit: 2020-10-13 | Discharge: 2020-10-13 | Disposition: A | Discharge status: Still In Hospital | Payer: HMO | Source: Ambulatory Visit | Attending: Radiation Oncology | Admitting: Radiation Oncology

## 2020-10-13 VITALS — BP 129/72 | HR 92 | Temp 98.2°F | Resp 16 | Ht 69.0 in | Wt 265.0 lb

## 2020-10-13 DIAGNOSIS — Z803 Family history of malignant neoplasm of breast: Secondary | ICD-10-CM | POA: Insufficient documentation

## 2020-10-13 DIAGNOSIS — Z170 Estrogen receptor positive status [ER+]: Secondary | ICD-10-CM | POA: Insufficient documentation

## 2020-10-13 DIAGNOSIS — Z87891 Personal history of nicotine dependence: Secondary | ICD-10-CM

## 2020-10-13 DIAGNOSIS — Z809 Family history of malignant neoplasm, unspecified: Secondary | ICD-10-CM | POA: Insufficient documentation

## 2020-10-13 DIAGNOSIS — D0512 Intraductal carcinoma in situ of left breast: Secondary | ICD-10-CM | POA: Insufficient documentation

## 2020-10-13 DIAGNOSIS — Z9889 Other specified postprocedural states: Secondary | ICD-10-CM | POA: Insufficient documentation

## 2020-10-13 DIAGNOSIS — C50512 Malignant neoplasm of lower-outer quadrant of left female breast: Secondary | ICD-10-CM

## 2020-10-13 DIAGNOSIS — Z510 Encounter for antineoplastic radiation therapy: Secondary | ICD-10-CM | POA: Insufficient documentation

## 2020-10-13 NOTE — Progress Notes (Signed)
BREAST CLINIC, [facility] [address]  [place] [address]  Operated by [facility]     Name: Patricia Martinez MRN:  [id number]   Date: 10/13/2020 Age: 65 y.o.         HPI:      Nurse/MA note reviewed.    Post Op (S/p: Left Breast NL/Lumpectomy/SNB, 10/03/20; L [place] MTOM, 09/05/20 for 87mm lesion at 5 o'clock position with pathology revealing high grade DCIS; left diagnostic mammogram/ left breast US 08/21/20 cat 4; bilateral screening mammogram 08/12/20 cat 0)      HPI   Patient is a 65 year old female presenting for postop visit.  She is status post left breast lumpectomy with SNB 10/03/2020 for ER/PR positive high-grade DCIS.  Pathology reviewed with patient.  Margins clear 2 lymph nodes removed are benign.  Patient tolerated well, complains of some mild discomfort at axillary site.    PMH:   Past Medical History:   Diagnosis Date   . Back pain    . 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 Cancer (CMS [place])    . Environmental allergies    . Environmental and seasonal allergies    . Esophageal reflux    . Essential hypertension    . H/O retinal detachment 2012    OD   . Lattice degeneration     OS   . Pseudophakia of both eyes    . Psoriasis    . Retinal tear of right eye 2008    OD         Past Surgical History:   Procedure Laterality Date   . BREAST LUMPECTOMY W/ NEEDLE LOCALIZATION Left 10/03/2020   . HX BREAST BIOPSY Left 09/05/2020   . HX BREAST BIOPSY Right     x4 biopsies   . HX CATARACT REMOVAL Bilateral 2013    IOL OU - Dr Truman Hayward   . HX CESAREAN SECTION     . HX LASER EYE SURGERY Left    . HX RETINAL DETACHMENT REPAIR Right 2012   . SENTINEL LYMPH NODE BIOPSY Left 10/03/2020         Family Medical History:     Problem Relation (Age of Onset)    Breast Cancer Mother (49), Maternal Grandmother (58)    Cancer Father    Retinal Detachment Brother          Current Outpatient Medications   Medication Sig Dispense Refill   . baclofen (LIORESAL) 10 mg Oral Tablet Take 10 mg by mouth Three  times a day as needed     . Desoximetasone (TOPICORT) 0.05 % Cream Apply 1 Dose topically Once a day     . HYDROcodone-acetaminophen (NORCO) 5-325 mg Oral Tablet Take 1 Tablet by mouth Every 4 hours as needed for Pain for up to 10 days 28 Tablet 0   . Ibuprofen (MOTRIN) 800 mg Oral Tablet Take 800 mg by mouth Three times a day as needed     . montelukast (SINGULAIR) 10 mg Oral Tablet Take 10 mg by mouth Every evening     . multivitamin with iron Oral Tablet Take 1 Tablet by mouth Once a day     . Olmesartan-Hydrochlorothiazide 40-25 mg Oral Tablet Take 1 Tablet by mouth Every morning     . omeprazole (PRILOSEC) 20 mg Oral Capsule, Delayed Release(E.C.) Take 20 mg by mouth Once a day       No current facility-administered medications for this visit.  No Known Allergies  Social History     Socioeconomic History   . Marital status: Married     Spouse name: Not on file   . Number of children: Not on file   . Years of education: Not on file   . Highest education level: Not on file   Occupational History   . Not on file   Tobacco Use   . Smoking status: Former Smoker     Packs/day: 2.00     Years: 17.00     Pack years: 34.00     Types: Cigarettes     Quit date: 1990     Years since quitting: 32.4   . Smokeless tobacco: Never Used   . Tobacco comment: quit 1990   Vaping Use   . Vaping Use: Never used   Substance and Sexual Activity   . Alcohol use: No     Alcohol/week: 0.0 standard drinks   . Drug use: Never   . Sexual activity: Not on file   Other Topics Concern   . Ability to Walk 1 Flight of Steps without SOB/CP No   . Routine Exercise Not Asked   . Ability to Walk 2 Flight of Steps without SOB/CP No   . Unable to Ambulate Not Asked   . Total Care Not Asked   . Ability To Do Own ADL's Yes   . Uses Walker Not Asked   . Other Activity Level Not Asked   . Uses Cane Not Asked   Social History Narrative   . Not on file     Social Determinants of Health     Financial Resource Strain: Not on file   Food Insecurity: Not on  file   Transportation Needs: Not on file   Physical Activity: Not on file   Stress: Not on file   Intimate Partner Violence: Not on file   Housing Stability: Not on file       ROS:   Review of systems negative unless marked in HPI.    PHYSICAL EXAM:     Lumpectomy site healing well minimal post biopsy changes in bruising.  No signs of acute infection    Imaging Reviewed.  Pathology reviewed.    Final Diagnosis   A.  Sentinel Lymph Node #1 Left Axilla:  Two Lymph Nodes Are Examined, None              Of Which Is Involved By Metastatic Carcinoma On H&E Or Pankeratin Stains.    B.  Sentinel Lymph Node #2 Left Axilla:  Two Lymph Nodes Are Examined, None              Of Which Is Involved By Metastatic Carcinoma On H&E Or Pankeratin Stains.    C.  Left Breast Excisional Biopsy:  Residual Focus Of High Grade Ductal Carcinoma   In-situ (See Synoptic Report Which Applies To Parts A, B and C).  Healing Biopsy Site.    D.  Superior Margin:  Proliferative Fibrocystic Changes.             No Malignancy Is Seen In Th Specimen.    E.  Lateral Margin:  Benign Appearing Breast Tissue.             No Malignancy Is Seen.    F.  Inferior Margin:  Benign Appearing Fatty Tissue.             No Malignancy Is Seen.    G.  Medial Margin:  Benign  Appearing Breast Tissue.             No Malignancy Is Seen.    H.  Posterior Margin:  Non-Proliferative Fibrocystic Changes.             No Malignancy Is Seen.   Electronically signed by Fredderick Erb, MD on 10/05/2020 at 1215           ASSESSMENT/PLAN  [place]   1. Breast neoplasm, Tis (DCIS), left  D05.12      Patient to be scheduled with Medical and Radiation Oncology to discuss adjuvant treatment.  Recommend patient to pad the area at axillary site to avoid rubbing and irritation of the area.  Will continue to follow patient closely for her ongoing breast health maintenance and oncology needs. Patient will follow up in [place] in 3 months.  To call with  any interval changes.            Forest Becker, MD

## 2020-10-13 NOTE — Cancer Center Note (Signed)
See dictated Cancer Center note on this date.

## 2020-10-13 NOTE — Patient Instructions (Signed)
Hormone Therapy for Breast Cancer  Estrogen is one of the female hormones. Sometimes breast cancer cells grow and multiply when exposed to estrogen. A hormone receptor test is done to measure the amount of certain proteins (called hormone receptors) in a person's breast cancer tissue. If the test is positive, it means that estrogen is probably helping the cancer cells to grow. Hormone therapyis a way to reduce the action of estrogen on these cells.   How the therapy works  Hormone therapy is only used on the types of cancer that have the proteins that estrogen can attach to. The therapy can help keep estrogen from attaching to these cells. Hormone therapy can make breast cancer less likely to come back (recur). It is the usual first treatment for hormone receptor-positive breast cancer that has spread. It is done in addition to other treatments such as surgery, radiation, or chemotherapy. The type of hormone therapy to be used depends on factors such as your gender, your age, and if the cancer has spread.   How the therapy is done  Hormone therapy can be done in several ways. These include:   Estrogen-receptor blockers. These medicines stop estrogen from working on cancer cells. They come in pill form and as an injection. They may be given for early breast cancer, or breast cancer that has spread. These medicines can help keep breast cancer from coming back after treatment. They can also help lower the risk of breast cancer in the other breast. They may be given to reduce the risk of breast cancer in some women.   Aromatase inhibitors (AIs). These medicines stop the body from making estrogen. Or they may stop estrogen from working in the body. They can be used to treat breast cancer that has spread and to help keep cancer from coming back after treatment. They are only given to women who are past menopause. AIs come in pill form.   LHRH and GnRH agonists. These medicines stop the body from making estrogen and  other hormones that are like estrogen that can also cause breast cancer cells to grow. The medicines may be injected into a muscle or just under the skin. Or they may come in pill form and be taken by mouth.   Surgery. Estrogen is mostly made in the ovaries. Surgery to remove the ovaries (ovarian ablation) may be done. This is only done in women who have not gone through menopause. It takes away the main source of estrogen in the body. This may help other hormone therapies to work better.  Possible side effects of hormone therapy for breast cancer  All forms of hormone therapy cause similar side effects that are like the symptoms of menopause. These can include:    Suddenly feeling hot (hot flashes)   No menstrual period   Night sweats   Extreme tiredness (fatigue)   Reduced interest in sex   Vaginal dryness or discharge   Mood changes   Muscle pain   Joint pain   Nausea or vomiting   Weight gain   Headaches  Other side effects of some types of hormone therapy include:   Loss of bone mass (osteoporosis)   Higher cholesterol levels   Cancerof the lining of the uterus (endometrial cancer)   Increased risk for blood clots, heart attacks, and stroke  It's important to know which medicines you're taking and what side effects they might cause. Talk with your healthcare providers about what signs to look for and when to call  them.Make sure you know what number to call with questions, even on evenings and weekends.   Coping with side effects  Side effects vary from person to person. Some of the side effects of hormone therapy are short-term (temporary). Others are more long-lasting. This depends on the type of treatment used, and how it affects your body. Your healthcare provider can tell you more. To help cope with side effects, try the tips below.    Talk with your healthcare provider about your symptoms. He or she may prescribe medicines that can help you feel better and reduce problems.   Don't use  hot tubs, saunas, or hot showers if you notice these increase your symptoms.   Don't have spicy food, alcohol, and caffeineif you notice these increase your symptoms.   Exercise and do other physical activity to help prevent weight gain and muscle loss.   Keep mentally active.   Work with your partner to manage sexual changes. Vaginal moisturizers and lubricants can help overall vaginal health and comfort during sex.   Try counseling or support groups.  Checking your progress  It's important to know which medicines you're taking. Write your medicines down. Ask your healthcare team how they work and what side effects they might have. Talk with your healthcare providers about what signs to look for and when to call them.Make sure you know what number to call with questions. Is there a different number for evenings and weekends?   During your treatment, you'll have routine visits with your healthcare provider. You may also have tests. These let your healthcare provider check your health and response to the treatment. After treatment ends, you and your healthcare provider will discuss your treatment results. You'll also discuss whether you need more cancer treatments.   [employer] last reviewed this educational content on 10/26/2018   2000-2021 [employer]. All rights reserved. This information is not intended as a substitute for professional medical care. Always follow your healthcare professional's instructions.

## 2020-10-16 ENCOUNTER — Ambulatory Visit: Payer: HMO | Attending: Internal Medicine | Admitting: Internal Medicine

## 2020-10-16 ENCOUNTER — Encounter (HOSPITAL_BASED_OUTPATIENT_CLINIC_OR_DEPARTMENT_OTHER): Payer: Self-pay | Admitting: Internal Medicine

## 2020-10-16 ENCOUNTER — Other Ambulatory Visit: Payer: Self-pay

## 2020-10-16 VITALS — BP 135/83 | HR 73 | Temp 98.1°F | Ht 69.0 in | Wt 265.8 lb

## 2020-10-16 DIAGNOSIS — Z803 Family history of malignant neoplasm of breast: Secondary | ICD-10-CM | POA: Insufficient documentation

## 2020-10-16 DIAGNOSIS — C50912 Malignant neoplasm of unspecified site of left female breast: Secondary | ICD-10-CM

## 2020-10-16 DIAGNOSIS — Z9889 Other specified postprocedural states: Secondary | ICD-10-CM | POA: Insufficient documentation

## 2020-10-16 DIAGNOSIS — Z87891 Personal history of nicotine dependence: Secondary | ICD-10-CM | POA: Insufficient documentation

## 2020-10-16 DIAGNOSIS — Z780 Asymptomatic menopausal state: Secondary | ICD-10-CM | POA: Insufficient documentation

## 2020-10-16 DIAGNOSIS — I10 Essential (primary) hypertension: Secondary | ICD-10-CM

## 2020-10-16 DIAGNOSIS — D0512 Intraductal carcinoma in situ of left breast: Secondary | ICD-10-CM | POA: Insufficient documentation

## 2020-10-16 MED ORDER — ANASTROZOLE 1 MG TABLET
1.0000 mg | ORAL_TABLET | Freq: Every day | ORAL | 1 refills | Status: DC
Start: 2020-12-04 — End: 2021-01-10

## 2020-10-19 NOTE — Cancer Center Note (Signed)
HEMATOLOGY/ONCOLOGY, [facility] [address]  [place] [address]  Operated by [facility]        Name: Patricia Martinez MRN:  [id number]   Date: 10/16/2020 Age: 65 y.o. DOB: [birth year]       Referring Physician: Forest Becker, MD  Primary Care Provider: Roetta Sessions, DO    Reason for visit/consultation:   Breast Cancer (Pt is here today for a initial visit for breast cancer, stated she is having some pain in her left breast, stated last night the pain was at 10, today it is down to 4 or 5 depending on how she moves. )    History of Present Illness:  Patricia Martinez is a 65 year old lady who had a screening mammogram on 08/12/20, which reported an area of asymmetry in the outer left breast. This was followed by a diagnostic Mammogram/US and an US biopsy on 09/05/20.  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 She had a lumpectomy and SLN biopsy on 10/03/20.  Four LN were removed, all of them negative.     She comes today to discuss about endocrine therapy.  She is recovering well from her surgery.  She does not report lack of appetite, weight loss, changes in her skin, dyspnea, cough, chest pain, abdominal pain, nausea, vomiting, bone pain.    Past Medical History  Past Medical History:   Diagnosis Date   . Back pain    . 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 Cancer (CMS [place])    . Environmental allergies    . Environmental and seasonal allergies    . Esophageal reflux    . Essential hypertension    . H/O retinal detachment 2012    OD   . Lattice degeneration     OS   . Pseudophakia of both eyes    . Psoriasis    . Retinal tear of right eye 2008    OD      Past Surgical History:   Procedure Laterality Date   . BREAST LUMPECTOMY W/ NEEDLE LOCALIZATION Left 10/03/2020   . HX BREAST BIOPSY Left 09/05/2020   . HX BREAST BIOPSY Right     x4 biopsies   . HX CATARACT REMOVAL Bilateral 2013    IOL OU - Dr Truman Hayward   . HX CESAREAN SECTION     . HX LASER EYE SURGERY Left    . HX  RETINAL DETACHMENT REPAIR Right 2012   . SENTINEL LYMPH NODE BIOPSY Left 10/03/2020      Family History   Problem Relation Age of Onset   . Retinal Detachment Brother    . Breast Cancer Mother 18        again @ 5   . Breast Cancer Maternal Grandmother 29   . Cancer Father         spinal tumor, inoperable   . Ovarian Cancer Neg Hx    . Prostate Cancer Neg Hx    . Pancreatic Cancer Neg Hx    . Melanoma Neg Hx        Social History  Occupation:   Social History     Occupational History   . Not on file    Hometown: [place] [address]  Social History     Socioeconomic History   . Marital status: Married   Tobacco Use   . Smoking status: Former Smoker     Packs/day: 2.00  Years: 17.00     Pack years: 34.00     Types: Cigarettes     Quit date: 16     Years since quitting: 32.4   . Smokeless tobacco: Never Used   . Tobacco comment: quit 1990   Vaping Use   . Vaping Use: Never used   Substance and Sexual Activity   . Alcohol use: No     Alcohol/week: 0.0 standard drinks   . Drug use: Never   Other Topics Concern   . Ability to Walk 1 Flight of Steps without SOB/CP No   . Ability to Walk 2 Flight of Steps without SOB/CP No   . Ability To Do Own ADL's Yes       No Known Allergies  Current Outpatient Medications   Medication Sig   . [START ON 12/04/2020] anastrozole (ARIMIDEX) 1 mg Oral Tablet Take 1 Tablet (1 mg total) by mouth Once a day Indications: start 3 weeks after radiation   . baclofen (LIORESAL) 10 mg Oral Tablet Take 10 mg by mouth Three times a day as needed   . Desoximetasone (TOPICORT) 0.05 % Cream Apply 1 Dose topically Once a day   . montelukast (SINGULAIR) 10 mg Oral Tablet Take 10 mg by mouth Every evening   . multivitamin with iron Oral Tablet Take 1 Tablet by mouth Once a day   . Olmesartan-Hydrochlorothiazide 40-25 mg Oral Tablet Take 1 Tablet by mouth Every morning   . omeprazole (PRILOSEC) 20 mg Oral Capsule, Delayed Release(E.C.) Take 20 mg by mouth Once a day       ROS:   Patient does not report any  other symptoms, besides the ones listed in the HPI, after a complete ROS of 14 systems.     Physical Examination:  BP 135/83 (Site: Right, Patient Position: Sitting, Cuff Size: Adult Large)   Pulse 73   Temp 36.7 C (98.1 F) (Oral)   Ht 1.753 m (5\' 9" )   Wt 121 kg (265 lb 12.8 oz)   SpO2 97%   BMI 39.25 kg/m       General:  Patient is in no acute distress.    Skin:  Normal color, no jaundice, no rash.    Eyes: No congestion, anicteric sclera.    Breasts: No masses, no skin dimpling or erythema, no nipple abnormalities in either breast.  Neck:  No masses, no lymphadenopathies or swelling.    Lungs:  No wheezes, no rales, no labored breathing.    Cardiovascular:  Regular heart rate and rhythm, no peripheral edema.    Abdomen:  Nondistended, soft, no tenderness, no masses.    Musculoskeletal:  No deformities, normal gait.    Neurological: Alert, oriented in person, time and place.  No gross neurological deficit  Psychiatry:  Normal mood and affect.    Lymph nodes:  No cervical or axillary lymphadenopathies.       ECOG Status: 0 - Fully active, able to carry on all pre-disease performance without restriction.         Pathology:  I have reviewed the surgical pathology reports of biopsy and final surgery.  Patient had High grade DCIS, ER + (90%) and PR + (10%).  SLN biopsy was negative (4/4)    Assessment:  1. DCIS, hormone receptor positive  2. Postmenopausal status.    Plan:  [name] will receive adjuvant radiation, under the care of Dr. Sallyanne Havers.    I explained that chemoprevention is recommended to prevent new events (DCIS or  infiltrating breast cancer) in the contralateral and ipsilateral breast.    She is postmenopausal, therefore I recommended treatment with Anastrozole 1 mg po daily. She will begin this treatment 3 weeks after she completes radiation. The plan is to complete 5 years.    I explained that this medication can cause hot flashes, vaginal dryness, fatigue, musculoskeletal pain, increased BP,  increased cholesterol, bone density loss, among other side effects.     During her treatment we need to monitor her symptoms, lipid panel (at least twice a year), blood pressure with each visit and DEXA scans every 2 years.      CC:  Roetta Sessions, DO  [address]  [place] [id number]          Portions of this note may be dictated using voice recognition software or a dictation service. Variances in spelling and vocabulary are possible and unintentional. Not all errors are caught/corrected. Please notify the [facility] if any discrepancies are noted or if the meaning of any statement is not clear.

## 2020-10-27 ENCOUNTER — Other Ambulatory Visit: Payer: Self-pay

## 2020-10-27 ENCOUNTER — Telehealth (INDEPENDENT_AMBULATORY_CARE_PROVIDER_SITE_OTHER): Payer: Self-pay | Admitting: Radiation Oncology

## 2020-10-27 ENCOUNTER — Inpatient Hospital Stay (HOSPITAL_BASED_OUTPATIENT_CLINIC_OR_DEPARTMENT_OTHER)
Admission: RE | Admit: 2020-10-27 | Discharge: 2020-10-27 | Disposition: A | Discharge status: Still In Hospital | Payer: HMO | Source: Ambulatory Visit | Attending: Radiation Oncology | Admitting: Radiation Oncology

## 2020-10-27 ENCOUNTER — Other Ambulatory Visit (INDEPENDENT_AMBULATORY_CARE_PROVIDER_SITE_OTHER): Payer: Self-pay | Admitting: Thoracic Surgery (Cardiothoracic Vascular Surgery)

## 2020-10-27 DIAGNOSIS — D0512 Intraductal carcinoma in situ of left breast: Secondary | ICD-10-CM

## 2020-10-27 NOTE — Nurses Notes (Signed)
Pt notified of left mammosite insertion with Dr. Wilhemina Cash on 10/31/2020 @ 07:45 with 06:45 arrival time. Caro Hight, RN

## 2020-10-30 ENCOUNTER — Other Ambulatory Visit (INDEPENDENT_AMBULATORY_CARE_PROVIDER_SITE_OTHER): Payer: Self-pay | Admitting: Family

## 2020-10-30 ENCOUNTER — Telehealth (INDEPENDENT_AMBULATORY_CARE_PROVIDER_SITE_OTHER): Payer: Self-pay | Admitting: Thoracic Surgery (Cardiothoracic Vascular Surgery)

## 2020-10-30 DIAGNOSIS — D0512 Intraductal carcinoma in situ of left breast: Secondary | ICD-10-CM

## 2020-10-30 MED ORDER — CHLORHEXIDINE GLUCONATE 4 % TOPICAL LIQUID
5.0000 mL | CUTANEOUS | 0 refills | Status: AC | PRN
Start: 2020-10-30 — End: 2020-11-10

## 2020-10-30 MED ORDER — BACITRACIN 500 UNIT/G OINTMENT TUBE
TOPICAL_OINTMENT | Freq: Two times a day (BID) | CUTANEOUS | 0 refills | Status: AC
Start: 2020-10-30 — End: 2020-11-10

## 2020-10-30 MED ORDER — CEPHALEXIN 500 MG CAPSULE
500.0000 mg | ORAL_CAPSULE | Freq: Two times a day (BID) | ORAL | 0 refills | Status: AC
Start: 2020-10-30 — End: 2020-11-10

## 2020-10-30 NOTE — Nursing Note (Signed)
Called patient to tell her to arrive at 50, to bring the person who will be helping with dressings and to pick up her prescriptions and bring them with her. She verbalized understanding

## 2020-10-31 ENCOUNTER — Ambulatory Visit (INDEPENDENT_AMBULATORY_CARE_PROVIDER_SITE_OTHER): Payer: HMO | Admitting: Radiation Oncology

## 2020-10-31 ENCOUNTER — Encounter (INDEPENDENT_AMBULATORY_CARE_PROVIDER_SITE_OTHER): Payer: Self-pay | Admitting: Thoracic Surgery (Cardiothoracic Vascular Surgery)

## 2020-10-31 ENCOUNTER — Other Ambulatory Visit (INDEPENDENT_AMBULATORY_CARE_PROVIDER_SITE_OTHER): Payer: Self-pay | Admitting: Radiation Oncology

## 2020-10-31 ENCOUNTER — Ambulatory Visit (HOSPITAL_BASED_OUTPATIENT_CLINIC_OR_DEPARTMENT_OTHER): Payer: HMO | Admitting: Thoracic Surgery (Cardiothoracic Vascular Surgery)

## 2020-10-31 ENCOUNTER — Inpatient Hospital Stay (HOSPITAL_BASED_OUTPATIENT_CLINIC_OR_DEPARTMENT_OTHER)
Admission: RE | Admit: 2020-10-31 | Discharge: 2020-10-31 | Disposition: A | Discharge status: Still In Hospital | Payer: HMO | Source: Ambulatory Visit | Attending: Radiation Oncology | Admitting: Radiation Oncology

## 2020-10-31 ENCOUNTER — Other Ambulatory Visit: Payer: Self-pay

## 2020-10-31 VITALS — BP 128/75 | HR 79 | Temp 97.5°F | Ht 69.0 in | Wt 265.0 lb

## 2020-10-31 DIAGNOSIS — C50919 Malignant neoplasm of unspecified site of unspecified female breast: Secondary | ICD-10-CM

## 2020-10-31 DIAGNOSIS — D0512 Intraductal carcinoma in situ of left breast: Secondary | ICD-10-CM

## 2020-10-31 MED ORDER — LIDOCAINE 1 %-EPINEPHRINE 1:100,000 INJECTION SOLUTION
15.0000 mL | INTRAMUSCULAR | Status: DC
Start: 2020-10-31 — End: 2021-01-10

## 2020-10-31 NOTE — Procedures (Signed)
BREAST CLINIC, [facility] [address]  [place] [address]  Operated by [facility]  Procedure Note    Name: Patricia Martinez MRN:  [id number]   Date: 10/31/2020 Age: 65 y.o.       Generic Procedure    Date/Time: 10/31/2020 8:07 AM  Performed by: Forest Becker, MD  Authorized by: Forest Becker, MD     Time Out:     Immediatley before the procedure, a time out was called:  Yes    Patient verified:  Yes    Procedure Verified:  Yes    Site Verified:  Yes        OPERATIVE NOTE    Patient Name: Patricia Martinez, Patricia Martinez Number: [id number]  Date of Service: 10/31/2020   Date of Birth: 09/04/1955    SURGEON: Emelda Fear, M.D.    PREOPERATIVE DIAGNOSES: left breast cancer    POSTOPERATIVE DIAGNOSES: same as above    PROCEDURE: Insertion of left breast radiation therapy(Mammosite) catheter with US guidance    SPECIMENS: none    COMPLICATIONS: none    INDICATIONS FOR PROCEDURE: Patient known to me in the breast center. She underwent recent breast conservation surgery for treatment of her newly diagnosed left breast cancer. Patient was deemed a candidate for partial breast radiation and required placement of radiation therapy catheter. Risks and benefits explained. All questions answered and consent obtained.     OPERATIVE DICTATION: The patient received preprocedure antibiotics in the morning prior to her procedure in the Breast Center.  She was brought into the procedure room.  The procedure and the site of surgery were confirmed with the patient and nursing assistant.  The patient was subsequently prepped and draped in the standard sterile fashion.  Using ultrasound guidance, we identified the postoperative cavity and planned trajectory of insertion of the radiation therapy catheter via a lateral approach.  An insertion site was marked approximately two fingersbreadth away from the lateral most edge of the postoperative cavity.  An incision was marked on skin lines corresponding to this  location.  The area was anesthetized with 1% lidocaine with epinephrine including the tract into the postoperative cavity.  The incision was made sharply and the subcutaneous tissue was bluntly dilated with Kelly clamp.  Following this, using ultrasound guidance, the sharp trocar with introducer sheath was placed into the postoperative cavity under ultrasound guidance.  The trocar was confirmed to be in correct position with ultrasound guidance and once the sharp trocar was removed leaving the introducer sheath in place, we were able to drain seroma fluid also confirming appropriate position.  Following this, the catheter was prepared appropriately, and the catheter was inserted through the introducer sheath into the postoperative cavity.  Introducer sheath was removed.  The catheter was inflated appropriately with little discomfort to the patient.  Again ultrasound was used to confirm appropriate position.  The catheter was then secured into place.  A sterile dressing was placed after the stylettes were removed and replaced with port covers.  The patient tolerated the procedure well.  There were no complications.      She has antibiotics for the remainder of her course of treatment and will follow-up with Dr. Sallyanne Havers and the radiation oncology department to commence her treatment.    Forest Becker, MD

## 2020-11-01 ENCOUNTER — Other Ambulatory Visit (INDEPENDENT_AMBULATORY_CARE_PROVIDER_SITE_OTHER): Payer: Self-pay | Admitting: Radiation Oncology

## 2020-11-01 ENCOUNTER — Inpatient Hospital Stay (HOSPITAL_BASED_OUTPATIENT_CLINIC_OR_DEPARTMENT_OTHER)
Admission: RE | Admit: 2020-11-01 | Discharge: 2020-11-01 | Disposition: A | Discharge status: Still In Hospital | Payer: HMO | Source: Ambulatory Visit | Attending: Radiation Oncology | Admitting: Radiation Oncology

## 2020-11-01 DIAGNOSIS — D0512 Intraductal carcinoma in situ of left breast: Secondary | ICD-10-CM

## 2020-11-02 ENCOUNTER — Encounter (INDEPENDENT_AMBULATORY_CARE_PROVIDER_SITE_OTHER): Payer: Self-pay | Admitting: Radiation Oncology

## 2020-11-02 ENCOUNTER — Other Ambulatory Visit (INDEPENDENT_AMBULATORY_CARE_PROVIDER_SITE_OTHER): Payer: Self-pay | Admitting: Radiation Oncology

## 2020-11-02 ENCOUNTER — Inpatient Hospital Stay (HOSPITAL_BASED_OUTPATIENT_CLINIC_OR_DEPARTMENT_OTHER)
Admission: RE | Admit: 2020-11-02 | Discharge: 2020-11-02 | Disposition: A | Discharge status: Still In Hospital | Payer: HMO | Source: Ambulatory Visit | Attending: Radiation Oncology | Admitting: Radiation Oncology

## 2020-11-02 DIAGNOSIS — D0512 Intraductal carcinoma in situ of left breast: Secondary | ICD-10-CM

## 2020-11-02 DIAGNOSIS — R197 Diarrhea, unspecified: Secondary | ICD-10-CM

## 2020-11-02 DIAGNOSIS — Z299 Encounter for prophylactic measures, unspecified: Secondary | ICD-10-CM

## 2020-11-02 MED ORDER — SULFAMETHOXAZOLE 800 MG-TRIMETHOPRIM 160 MG TABLET
1.0000 | ORAL_TABLET | Freq: Two times a day (BID) | ORAL | 0 refills | Status: DC
Start: 2020-11-02 — End: 2020-12-12

## 2020-11-02 NOTE — Nurses Notes (Signed)
Patient complained of 3-4 liquid bowel movements in the last hour. Dr. Sallyanne Havers notified, he instructed her to stop Cephalexin, Bactrim DS ordered. Will obtain stool specimen for C-Diff  Patricia Living, RN

## 2020-11-03 ENCOUNTER — Inpatient Hospital Stay (HOSPITAL_BASED_OUTPATIENT_CLINIC_OR_DEPARTMENT_OTHER)
Admission: RE | Admit: 2020-11-03 | Discharge: 2020-11-03 | Disposition: A | Discharge status: Still In Hospital | Payer: HMO | Source: Ambulatory Visit | Attending: Radiation Oncology | Admitting: Radiation Oncology

## 2020-11-03 DIAGNOSIS — D0512 Intraductal carcinoma in situ of left breast: Secondary | ICD-10-CM

## 2020-11-06 ENCOUNTER — Other Ambulatory Visit (INDEPENDENT_AMBULATORY_CARE_PROVIDER_SITE_OTHER): Payer: Self-pay | Admitting: Radiation Oncology

## 2020-11-06 ENCOUNTER — Other Ambulatory Visit: Payer: Self-pay

## 2020-11-06 ENCOUNTER — Inpatient Hospital Stay (HOSPITAL_BASED_OUTPATIENT_CLINIC_OR_DEPARTMENT_OTHER)
Admission: RE | Admit: 2020-11-06 | Discharge: 2020-11-06 | Disposition: A | Discharge status: Still In Hospital | Payer: HMO | Source: Ambulatory Visit | Attending: Radiation Oncology | Admitting: Radiation Oncology

## 2020-11-06 ENCOUNTER — Inpatient Hospital Stay (INDEPENDENT_AMBULATORY_CARE_PROVIDER_SITE_OTHER)
Admission: RE | Admit: 2020-11-06 | Discharge: 2020-11-06 | Disposition: A | Discharge status: Still In Hospital | Payer: HMO | Source: Ambulatory Visit | Attending: Radiation Oncology | Admitting: Radiation Oncology

## 2020-11-06 DIAGNOSIS — C50919 Malignant neoplasm of unspecified site of unspecified female breast: Secondary | ICD-10-CM

## 2020-11-06 DIAGNOSIS — D0512 Intraductal carcinoma in situ of left breast: Secondary | ICD-10-CM

## 2020-11-06 MED ORDER — LORAZEPAM 1 MG TABLET
1.0000 mg | ORAL_TABLET | Freq: Once | ORAL | 0 refills | Status: AC
Start: 2020-11-06 — End: 2020-11-06

## 2020-11-07 ENCOUNTER — Ambulatory Visit (INDEPENDENT_AMBULATORY_CARE_PROVIDER_SITE_OTHER): Payer: HMO | Admitting: Radiation Oncology

## 2020-11-08 ENCOUNTER — Other Ambulatory Visit: Payer: Self-pay

## 2020-11-08 ENCOUNTER — Inpatient Hospital Stay (HOSPITAL_BASED_OUTPATIENT_CLINIC_OR_DEPARTMENT_OTHER)
Admission: RE | Admit: 2020-11-08 | Discharge: 2020-11-08 | Disposition: A | Discharge status: Still In Hospital | Payer: HMO | Source: Ambulatory Visit | Attending: Radiation Oncology | Admitting: Radiation Oncology

## 2020-11-08 DIAGNOSIS — D0512 Intraductal carcinoma in situ of left breast: Secondary | ICD-10-CM

## 2020-11-09 ENCOUNTER — Inpatient Hospital Stay (HOSPITAL_BASED_OUTPATIENT_CLINIC_OR_DEPARTMENT_OTHER)
Admission: RE | Admit: 2020-11-09 | Discharge: 2020-11-09 | Disposition: A | Discharge status: Still In Hospital | Payer: HMO | Source: Ambulatory Visit | Attending: Radiation Oncology | Admitting: Radiation Oncology

## 2020-11-09 ENCOUNTER — Encounter (INDEPENDENT_AMBULATORY_CARE_PROVIDER_SITE_OTHER): Payer: Self-pay | Admitting: Radiation Oncology

## 2020-11-09 ENCOUNTER — Other Ambulatory Visit: Payer: Self-pay

## 2020-11-09 DIAGNOSIS — D0512 Intraductal carcinoma in situ of left breast: Secondary | ICD-10-CM

## 2020-11-09 MED ORDER — MORPHINE CONCENTRATE 10 MG/0.5 ML ORAL SYRINGE (FOR ORAL USE ONLY)
INJECTION | ORAL | Status: AC
Start: 2020-11-09 — End: 2020-11-09
  Filled 2020-11-09: qty 0.5

## 2020-11-10 ENCOUNTER — Inpatient Hospital Stay
Admission: RE | Admit: 2020-11-10 | Discharge: 2020-11-10 | Disposition: A | Discharge status: Still In Hospital | Payer: HMO | Source: Ambulatory Visit | Attending: Radiation Oncology | Admitting: Radiation Oncology

## 2020-11-30 ENCOUNTER — Other Ambulatory Visit (HOSPITAL_BASED_OUTPATIENT_CLINIC_OR_DEPARTMENT_OTHER): Payer: Self-pay | Admitting: Internal Medicine

## 2020-11-30 ENCOUNTER — Ambulatory Visit
Admission: RE | Admit: 2020-11-30 | Discharge: 2020-11-30 | Disposition: A | Discharge status: Home / Self Care | Payer: Medicare PPO | Source: Ambulatory Visit | Attending: Internal Medicine | Admitting: Internal Medicine

## 2020-11-30 ENCOUNTER — Other Ambulatory Visit: Payer: Self-pay

## 2020-11-30 DIAGNOSIS — Z780 Asymptomatic menopausal state: Secondary | ICD-10-CM | POA: Insufficient documentation

## 2020-12-12 ENCOUNTER — Encounter (INDEPENDENT_AMBULATORY_CARE_PROVIDER_SITE_OTHER): Payer: Self-pay | Admitting: Radiation Oncology

## 2020-12-12 ENCOUNTER — Other Ambulatory Visit: Payer: Self-pay

## 2020-12-12 ENCOUNTER — Inpatient Hospital Stay
Admission: RE | Admit: 2020-12-12 | Discharge: 2020-12-12 | Disposition: A | Discharge status: Still In Hospital | Payer: Medicare PPO | Source: Ambulatory Visit | Attending: Radiation Oncology | Admitting: Radiation Oncology

## 2020-12-12 VITALS — Ht 69.0 in | Wt 266.0 lb

## 2020-12-12 DIAGNOSIS — D0512 Intraductal carcinoma in situ of left breast: Secondary | ICD-10-CM | POA: Insufficient documentation

## 2020-12-15 NOTE — Cancer Center Note (Signed)
See dictated [place] f/u note on 12/12/20.

## 2021-01-08 LAB — ECG 12 LEAD
Atrial Rate: 72 {beats}/min
Calculated T Axis: 110 degrees
PR Interval: 192 ms
QRS Duration: 80 ms
QT Interval: 350 ms
QTC Calculation: 383 ms
Ventricular rate: 72 {beats}/min

## 2021-01-10 ENCOUNTER — Encounter (INDEPENDENT_AMBULATORY_CARE_PROVIDER_SITE_OTHER): Payer: Self-pay | Admitting: Internal Medicine

## 2021-01-10 ENCOUNTER — Ambulatory Visit: Payer: Medicare PPO | Attending: Internal Medicine | Admitting: Internal Medicine

## 2021-01-10 ENCOUNTER — Other Ambulatory Visit: Payer: Self-pay

## 2021-01-10 ENCOUNTER — Ambulatory Visit (INDEPENDENT_AMBULATORY_CARE_PROVIDER_SITE_OTHER): Payer: Self-pay | Admitting: Internal Medicine

## 2021-01-10 VITALS — HR 81 | Temp 97.8°F | Wt 268.1 lb

## 2021-01-10 DIAGNOSIS — Z808 Family history of malignant neoplasm of other organs or systems: Secondary | ICD-10-CM | POA: Insufficient documentation

## 2021-01-10 DIAGNOSIS — Z803 Family history of malignant neoplasm of breast: Secondary | ICD-10-CM | POA: Insufficient documentation

## 2021-01-10 DIAGNOSIS — Z170 Estrogen receptor positive status [ER+]: Secondary | ICD-10-CM | POA: Insufficient documentation

## 2021-01-10 DIAGNOSIS — M8580 Other specified disorders of bone density and structure, unspecified site: Secondary | ICD-10-CM | POA: Insufficient documentation

## 2021-01-10 DIAGNOSIS — Z87891 Personal history of nicotine dependence: Secondary | ICD-10-CM | POA: Insufficient documentation

## 2021-01-10 DIAGNOSIS — D0512 Intraductal carcinoma in situ of left breast: Secondary | ICD-10-CM | POA: Insufficient documentation

## 2021-01-10 MED ORDER — EXEMESTANE 25 MG TABLET
25.0000 mg | ORAL_TABLET | Freq: Every day | ORAL | 0 refills | Status: AC
Start: 2021-01-10 — End: 2021-04-10

## 2021-01-10 NOTE — Telephone Encounter (Signed)
Patient prescribed Exemestane 25 mg oral daily this morning, and was instructed to notify office if it it wasn't affordable.  Patient states it is $90 a month and inquiring if she can get it somewhere elsewhere any cheaper.  Bartholomew Boards, RN notified.   Leeroy Bock, RN

## 2021-01-10 NOTE — Cancer Center Note (Signed)
HEMATOLOGY/ONCOLOGY, [facility] [address]  [place] [address]  Operated by [facility]        Name: Patricia Martinez MRN:  [id number]   Date: 01/10/2021 Age: 65 y.o. DOB: [birth year]       Referring Physician: Bethanie Dicker, Delfina Redwood, MD  Primary Care Provider: Roetta Sessions, DO    Reason for visit/consultation:   Follow Up and Results    History of Present Illness:  Ms. Patricia Martinez is a 65 year old lady who had a screening mammogram on 08/12/20, which reported an area of asymmetry in the outer left breast. This was followed by a diagnostic Mammogram/US and an US biopsy on 09/05/20.  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 She had a lumpectomy and SLN biopsy on 10/03/20.  Four LN were removed, all of them negative.     She started Anastrozole for chemoprevention and didn't tolerate it.  She explains that she developed nausea, vomiting and abdominal pain, 3 days after starting it.  She stopped taking it and the symptoms went away.  She tried again and the symptoms recurred.     Past Medical History  Past Medical History:   Diagnosis Date   . Back pain    . 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 Cancer (CMS [place])    . Environmental allergies    . Environmental and seasonal allergies    . Esophageal reflux    . Essential hypertension    . H/O retinal detachment 2012    OD   . Lattice degeneration     OS   . Pseudophakia of both eyes    . Psoriasis    . Retinal tear of right eye 2008    OD      Past Surgical History:   Procedure Laterality Date   . BREAST LUMPECTOMY W/ NEEDLE LOCALIZATION Left 10/03/2020   . HX BREAST BIOPSY Left 09/05/2020   . HX BREAST BIOPSY Right     x4 biopsies   . HX CATARACT REMOVAL Bilateral 2013    IOL OU - Dr Truman Hayward   . HX CESAREAN SECTION     . HX LASER EYE SURGERY Left    . HX RETINAL DETACHMENT REPAIR Right 2012   . SENTINEL LYMPH NODE BIOPSY Left 10/03/2020      Family History   Problem Relation Age of Onset   . Retinal Detachment Brother    .  Breast Cancer Mother 85        again @ 20   . Breast Cancer Maternal Grandmother 79   . Cancer Father         spinal tumor, inoperable   . Ovarian Cancer Neg Hx    . Prostate Cancer Neg Hx    . Pancreatic Cancer Neg Hx    . Melanoma Neg Hx        Social History  Occupation:   Social History     Occupational History   . Not on file    Hometown: [place]  [address]  Social History     Socioeconomic History   . Marital status: Married   Tobacco Use   . Smoking status: Former Smoker     Packs/day: 2.00     Years: 17.00     Pack years: 34.00     Types: Cigarettes     Quit date: 1990     Years since quitting: 32.6   . Smokeless tobacco:  Never Used   . Tobacco comment: quit 1990   Vaping Use   . Vaping Use: Never used   Substance and Sexual Activity   . Alcohol use: No     Alcohol/week: 0.0 standard drinks   . Drug use: Never   Other Topics Concern   . Ability to Walk 1 Flight of Steps without SOB/CP No   . Ability to Walk 2 Flight of Steps without SOB/CP No   . Ability To Do Own ADL's Yes       No Known Allergies  Current Outpatient Medications   Medication Sig   . Desoximetasone (TOPICORT) 0.05 % Cream Apply 1 Dose topically Once a day   . exemestane (AROMASIN) 25 mg Oral Tablet Take 1 Tablet (25 mg total) by mouth Once a day for 90 days   . montelukast (SINGULAIR) 10 mg Oral Tablet Take 10 mg by mouth Every evening   . multivitamin with iron Oral Tablet Take 1 Tablet by mouth Once a day   . Olmesartan-Hydrochlorothiazide 40-25 mg Oral Tablet Take 1 Tablet by mouth Every morning   . omeprazole (PRILOSEC) 20 mg Oral Capsule, Delayed Release(E.C.) Take 20 mg by mouth Once a day       ROS:   Patient does not report any other symptoms, besides the ones listed in the HPI, after a complete ROS of 14 systems.     Physical Examination:  Pulse 81   Temp 36.6 C (97.8 F)   Wt 122 kg (268 lb 1.6 oz)   SpO2 94%   BMI 39.59 kg/m       General:  Patient is in no acute distress.    Skin:  Normal color, no jaundice, no rash.     Eyes: No congestion, anicteric sclera.    Neck:  No masses, no lymphadenopathies or swelling.    Lungs:  No wheezes, no rales, no labored breathing.    Cardiovascular:  Regular heart rate and rhythm, no peripheral edema.    Abdomen:  Nondistended, soft, no tenderness, no masses.    Musculoskeletal:  No deformities, normal gait.    Neurological: Alert, oriented in person, time and place.  No gross neurological deficit  Psychiatry:  Normal mood and affect.    Lymph nodes:  No cervical or axillary lymphadenopathies.       ECOG Status: 0 - Fully active, able to carry on all pre-disease performance without restriction.         Pathology:  I have reviewed the surgical pathology reports of biopsy and final surgery.  Patient had High grade DCIS, ER + (90%) and PR + (10%).  SLN biopsy was negative (4/4)    Radiology:  DEXA scan on 11/30/20 remarkable for mild osteopenia     Assessment:  1. DCIS, hormone receptor positive  2. Osteopenia    Plan:  - We will change Anastrozole to Exemestane 25 mg po daily, to see if she tolerates it better.  - We discussed about the results of her DEXA scan. I explained the role of bisphosphonate when we want to prevent worsening of the bone density.  She prefers to watch and wait.  Continue calcium and vitamin D. Repeat DEXA scan in 2 years.  -  She will come back in early October 2022 to assess how she is tolerating Exemestane.  - Mammogram should be done in Dec. 2022.    She plans to move to [place] in October 2022      CC:  Roetta Sessions, DO  [address]  [place] [address]          Portions of this note may be dictated using voice recognition software or a dictation service. Variances in spelling and vocabulary are possible and unintentional. Not all errors are caught/corrected. Please notify the [facility] if any discrepancies are noted or if the meaning of any statement is not clear.

## 2021-01-11 ENCOUNTER — Telehealth (INDEPENDENT_AMBULATORY_CARE_PROVIDER_SITE_OTHER): Payer: Self-pay | Admitting: Internal Medicine

## 2021-01-11 NOTE — Telephone Encounter (Signed)
Patient was referred to me by nurse navigator for assistance with Aromasin as patient has unaffordable copay. Called patient, explained the [place] patient to bring income verifications and insurance card(s) to submit with application. Will plan to meet [id number] to complete application. Notified nurse navigator. Working on completion of application. Obtained physician signature and prescription portion of application.

## 2021-01-12 ENCOUNTER — Encounter (INDEPENDENT_AMBULATORY_CARE_PROVIDER_SITE_OTHER): Payer: Self-pay | Admitting: Internal Medicine

## 2021-01-12 NOTE — Nursing Note (Signed)
Met with patient/spouse to complete Aromasin application. Multimedia programmer. Obtained patient's signature and income verifications. Provided my contact information.   Faxed application to [employer]. Received verification all pages were transmitted successfully.

## 2021-01-15 ENCOUNTER — Telehealth (INDEPENDENT_AMBULATORY_CARE_PROVIDER_SITE_OTHER): Payer: Self-pay | Admitting: Internal Medicine

## 2021-01-15 ENCOUNTER — Encounter (INDEPENDENT_AMBULATORY_CARE_PROVIDER_SITE_OTHER): Payer: Self-pay | Admitting: Thoracic Surgery (Cardiothoracic Vascular Surgery)

## 2021-01-15 NOTE — Telephone Encounter (Signed)
[employer], spoke with Luellen Pucker to verify all of patient's aromasin application was received. She verified all of application was received and they would work on processing.

## 2021-01-16 ENCOUNTER — Telehealth (INDEPENDENT_AMBULATORY_CARE_PROVIDER_SITE_OTHER): Payer: Self-pay | Admitting: Internal Medicine

## 2021-01-16 NOTE — Nursing Note (Signed)
Will wait for her call when she receives Aromasin.

## 2021-01-16 NOTE — Telephone Encounter (Signed)
-----   Message from [facility] sent at 01/16/2021  9:24 AM EDT -----  Regarding: Pt TD  Patient has been approved by [facility] for free Aromasin until 05/26/21. Called pt and notified and provided phone # for her to call to set up shipment. Advised her, once she receives the medication to call in and make an appt with you to check it before she starts taking it.

## 2021-01-16 NOTE — Telephone Encounter (Signed)
Received fax approval letter from [employer]. Patient has been approved for free Aromasin until 05/26/21. Called patient to notify.Provided phone number to call to set up shipment. Advised patient to contact Dr. Payton Doughty nurse navigator once medication is received to schedule a time to bring the medication in for her to check prior to patient starting. Will notify nurse navigator. Will scan approval to patient's chart-media manager.

## 2021-01-18 ENCOUNTER — Telehealth (INDEPENDENT_AMBULATORY_CARE_PROVIDER_SITE_OTHER): Payer: Self-pay | Admitting: Internal Medicine

## 2021-01-18 NOTE — Nursing Note (Signed)
Spoke to patient regarding Aromasin. She is not willing to come and sign a consent at this time. She will come in October at next follow up. Dr. Derrill Kay aware.

## 2021-01-22 ENCOUNTER — Encounter (INDEPENDENT_AMBULATORY_CARE_PROVIDER_SITE_OTHER): Payer: Self-pay | Admitting: Thoracic Surgery (Cardiothoracic Vascular Surgery)

## 2021-01-26 ENCOUNTER — Ambulatory Visit
Payer: Medicare PPO | Attending: Thoracic Surgery (Cardiothoracic Vascular Surgery) | Admitting: Thoracic Surgery (Cardiothoracic Vascular Surgery)

## 2021-01-26 ENCOUNTER — Other Ambulatory Visit: Payer: Self-pay

## 2021-01-26 ENCOUNTER — Encounter (INDEPENDENT_AMBULATORY_CARE_PROVIDER_SITE_OTHER): Payer: Self-pay | Admitting: Thoracic Surgery (Cardiothoracic Vascular Surgery)

## 2021-01-26 DIAGNOSIS — Z923 Personal history of irradiation: Secondary | ICD-10-CM | POA: Insufficient documentation

## 2021-01-26 DIAGNOSIS — Z79811 Long term (current) use of aromatase inhibitors: Secondary | ICD-10-CM | POA: Insufficient documentation

## 2021-01-26 DIAGNOSIS — N6012 Diffuse cystic mastopathy of left breast: Secondary | ICD-10-CM

## 2021-01-26 DIAGNOSIS — N6011 Diffuse cystic mastopathy of right breast: Secondary | ICD-10-CM

## 2021-01-26 DIAGNOSIS — Z87891 Personal history of nicotine dependence: Secondary | ICD-10-CM | POA: Insufficient documentation

## 2021-01-26 DIAGNOSIS — Z803 Family history of malignant neoplasm of breast: Secondary | ICD-10-CM | POA: Insufficient documentation

## 2021-01-26 DIAGNOSIS — D0512 Intraductal carcinoma in situ of left breast: Secondary | ICD-10-CM | POA: Insufficient documentation

## 2021-01-26 DIAGNOSIS — Z9889 Other specified postprocedural states: Secondary | ICD-10-CM | POA: Insufficient documentation

## 2021-01-26 NOTE — Progress Notes (Signed)
BREAST CLINIC, [facility] [address]  [place] [address]  Operated by [facility]     Name: Patricia Martinez MRN:  [id number]   Date: 01/26/2021 Age: 65 y.o.         HPI:      Nurse/MA note reviewed.    Follow Up 3 Months (Left Breast NL/Lumpectomy/SNB, 10/03/20; L [place] MTOM, 09/05/20 for 53m lesion at 5 o'clock position with pathology revealing high grade DCIS; left diagnostic mammogram/ left breast [place] cat 4; bilateral screening mammogram 08/12/20 cat 0; Completed radiation via mammosite; On Aromasin. /)      HPI   Patient presenting for 3 month follow-up.  She is status post left breast lumpectomy with sentinel node biopsy 10/03/2020.  She completed radiation in June and is currently on Aromasin.  Currently asymptomatic without breast pain, palpable areas, skin or nipple changes, nipple discharge.    PMH:   Past Medical History:   Diagnosis Date   . Back pain    . 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 Cancer (CMS [place]    . Environmental allergies    . Environmental and seasonal allergies    . Esophageal reflux    . Essential hypertension    . H/O retinal detachment 2012    OD   . Lattice degeneration     OS   . Pseudophakia of both eyes    . Psoriasis    . Retinal tear of right eye 2008    OD         Past Surgical History:   Procedure Laterality Date   . BREAST LUMPECTOMY W/ NEEDLE LOCALIZATION Left 10/03/2020   . HX BREAST BIOPSY Left 09/05/2020   . HX BREAST BIOPSY Right     x4 biopsies   . HX CATARACT REMOVAL Bilateral 2013    IOL OU - Dr LTruman Hayward  . HX CESAREAN SECTION     . HX LASER EYE SURGERY Left    . HX RETINAL DETACHMENT REPAIR Right 2012   . SENTINEL LYMPH NODE BIOPSY Left 10/03/2020         Family Medical History:     Problem Relation (Age of Onset)    Breast Cancer Mother ((27, Maternal Grandmother ((58    Cancer Father    Retinal Detachment Brother          Current Outpatient Medications   Medication Sig Dispense Refill   . Desoximetasone (TOPICORT) 0.05 % Cream  Apply 1 Dose topically Once a day     . exemestane (AROMASIN) 25 mg Oral Tablet Take 1 Tablet (25 mg total) by mouth Once a day for 90 days 90 Tablet 0   . montelukast (SINGULAIR) 10 mg Oral Tablet Take 10 mg by mouth Every evening     . multivitamin with iron Oral Tablet Take 1 Tablet by mouth Once a day     . Olmesartan-Hydrochlorothiazide 40-25 mg Oral Tablet Take 1 Tablet by mouth Every morning     . omeprazole (PRILOSEC) 20 mg Oral Capsule, Delayed Release(E.C.) Take 20 mg by mouth Once a day       No current facility-administered medications for this visit.     No Known Allergies  Social History     Socioeconomic History   . Marital status: Married     Spouse name: Not on file   . Number of children: Not on file   . Years of education: Not on file   .  Highest education level: Not on file   Occupational History   . Not on file   Tobacco Use   . Smoking status: Former Smoker     Packs/day: 2.00     Years: 17.00     Pack years: 34.00     Types: Cigarettes     Quit date: 1990     Years since quitting: 32.6   . Smokeless tobacco: Never Used   . Tobacco comment: quit 1990   Vaping Use   . Vaping Use: Never used   Substance and Sexual Activity   . Alcohol use: No     Alcohol/week: 0.0 standard drinks   . Drug use: Never   . Sexual activity: Not on file   Other Topics Concern   . Ability to Walk 1 Flight of Steps without SOB/CP No   . Routine Exercise Not Asked   . Ability to Walk 2 Flight of Steps without SOB/CP No   . Unable to Ambulate Not Asked   . Total Care Not Asked   . Ability To Do Own ADL's Yes   . Uses Walker Not Asked   . Other Activity Level Not Asked   . Uses Cane Not Asked   Social History Narrative   . Not on file     Social Determinants of Health     Financial Resource Strain: Not on file   Food Insecurity: Not on file   Transportation Needs: Not on file   Physical Activity: Not on file   Stress: Not on file   Intimate Partner Violence: Not on file   Housing Stability: Not on file       ROS:   Review  of systems negative unless marked in HPI.    PHYSICAL EXAM:     Left breast lumpectomy site well healed with no evidence of local recurrence.  Bilateral fibrocystic changes with no palpable abnormalities.  No skin or nipple changes or nipple discharge.  No lymphadenopathy bilaterally    Imaging Reviewed:    No results found for this or any previous visit (from the past 17520 hour(s)).          ASSESSMENT/PLAN  ENCOUNTER DIAGNOSES     ICD-10-CM   1. Breast neoplasm, Tis (DCIS), left  D05.12      Pt is moving to [place], she understands that she is due for her diagnostic imaging in December 2022. She prefers to find a DR in her new location and have them order her imaging. Pt will call with any problems. We will fax any information to new dr once she informs [place] who she is seeing.          Forest Becker, MD

## 2021-02-27 ENCOUNTER — Other Ambulatory Visit: Payer: Self-pay

## 2021-02-27 ENCOUNTER — Ambulatory Visit: Payer: Medicare PPO | Attending: Internal Medicine | Admitting: Internal Medicine

## 2021-02-27 ENCOUNTER — Encounter (INDEPENDENT_AMBULATORY_CARE_PROVIDER_SITE_OTHER): Payer: Self-pay | Admitting: Internal Medicine

## 2021-02-27 VITALS — BP 121/84 | HR 71 | Temp 98.2°F | Ht 69.0 in | Wt 270.6 lb

## 2021-02-27 DIAGNOSIS — Z803 Family history of malignant neoplasm of breast: Secondary | ICD-10-CM | POA: Insufficient documentation

## 2021-02-27 DIAGNOSIS — Z87891 Personal history of nicotine dependence: Secondary | ICD-10-CM | POA: Insufficient documentation

## 2021-02-27 DIAGNOSIS — Z79899 Other long term (current) drug therapy: Secondary | ICD-10-CM | POA: Insufficient documentation

## 2021-02-27 DIAGNOSIS — T451X5D Adverse effect of antineoplastic and immunosuppressive drugs, subsequent encounter: Secondary | ICD-10-CM

## 2021-02-27 DIAGNOSIS — Z79811 Long term (current) use of aromatase inhibitors: Secondary | ICD-10-CM | POA: Insufficient documentation

## 2021-02-27 DIAGNOSIS — M2559 Pain in other specified joint: Secondary | ICD-10-CM

## 2021-02-27 DIAGNOSIS — Z9889 Other specified postprocedural states: Secondary | ICD-10-CM | POA: Insufficient documentation

## 2021-02-27 DIAGNOSIS — D0512 Intraductal carcinoma in situ of left breast: Secondary | ICD-10-CM | POA: Insufficient documentation

## 2021-02-27 DIAGNOSIS — M8580 Other specified disorders of bone density and structure, unspecified site: Secondary | ICD-10-CM | POA: Insufficient documentation

## 2021-02-27 DIAGNOSIS — R61 Generalized hyperhidrosis: Secondary | ICD-10-CM | POA: Insufficient documentation

## 2021-02-27 DIAGNOSIS — M2550 Pain in unspecified joint: Secondary | ICD-10-CM | POA: Insufficient documentation

## 2021-02-27 MED ORDER — VENLAFAXINE 37.5 MG TABLET
37.5000 mg | ORAL_TABLET | Freq: Every day | ORAL | 2 refills | Status: AC
Start: 2021-02-27 — End: 2021-05-28

## 2021-02-27 NOTE — Cancer Center Note (Signed)
HEMATOLOGY/ONCOLOGY, [facility] [address]  [place] [address]  Operated by [facility]        Name: Patricia Martinez MRN:  [id number]   Date: 02/27/2021 Age: 65 y.o. DOB: 1956-03-05       Referring Physician: Bethanie Dicker, Delfina Redwood, MD  Primary Care Provider: Roetta Sessions, DO    Reason for visit/consultation:   Breast Cancer (Pt is here today for a follow up, stated she is feeling okay, denied any complaints besides joint pain. )    History of Present Illness:  Patricia Martinez is a 65 year old lady who had a screening mammogram on 08/12/20, which reported an area of asymmetry in the outer left breast. This was followed by a diagnostic Mammogram/US and an US biopsy on 09/05/20.  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 She had a lumpectomy and SLN biopsy on 10/03/20.  Four LN were removed, all of them negative.     She started Anastrozole for chemoprevention and didn't tolerate it.  She explains that she developed nausea, vomiting and abdominal pain, 3 days after starting it.  She stopped taking it and the symptoms went away.    In 01/10/21 she received a prescription for Exemestane to see if she tolerated it in a better way.    INTERVAL HISTORY  She reports arthralgias diffusely, for which she uses Ibuprofen, but it does not make them go away. She says she has lived with arthralgias for years, but they intensified with Exemestane.  She also reports feeling hot and sweating at night, which also started with the use of Exemestane.    Past Medical History  Past Medical History:   Diagnosis Date   . Back pain    . 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 Cancer (CMS [place])    . Environmental allergies    . Environmental and seasonal allergies    . Esophageal reflux    . Essential hypertension    . H/O retinal detachment 2012    OD   . Lattice degeneration     OS   . Pseudophakia of both eyes    . Psoriasis    . Retinal tear of right eye 2008    OD      Past Surgical History:    Procedure Laterality Date   . BREAST LUMPECTOMY W/ NEEDLE LOCALIZATION Left 10/03/2020   . HX BREAST BIOPSY Left 09/05/2020   . HX BREAST BIOPSY Right     x4 biopsies   . HX CATARACT REMOVAL Bilateral 2013    IOL OU - Dr Truman Hayward   . HX CESAREAN SECTION     . HX LASER EYE SURGERY Left    . HX RETINAL DETACHMENT REPAIR Right 2012   . SENTINEL LYMPH NODE BIOPSY Left 10/03/2020      Family History   Problem Relation Age of Onset   . Retinal Detachment Brother    . Breast Cancer Mother 33        again @ 49   . Breast Cancer Maternal Grandmother 57   . Cancer Father         spinal tumor, inoperable   . Ovarian Cancer Neg Hx    . Prostate Cancer Neg Hx    . Pancreatic Cancer Neg Hx    . Melanoma Neg Hx        Social History  Occupation:   Social History     Occupational  History   . Not on file    Hometown: [place] [address]  Social History     Socioeconomic History   . Marital status: Married   Tobacco Use   . Smoking status: Former Smoker     Packs/day: 2.00     Years: 17.00     Pack years: 34.00     Types: Cigarettes     Quit date: 1990     Years since quitting: 32.7   . Smokeless tobacco: Never Used   . Tobacco comment: quit 1990   Vaping Use   . Vaping Use: Never used   Substance and Sexual Activity   . Alcohol use: No     Alcohol/week: 0.0 standard drinks   . Drug use: Never   Other Topics Concern   . Ability to Walk 1 Flight of Steps without SOB/CP No   . Ability to Walk 2 Flight of Steps without SOB/CP No   . Ability To Do Own ADL's Yes       No Known Allergies  Current Outpatient Medications   Medication Sig   . Desoximetasone (TOPICORT) 0.05 % Cream Apply 1 Dose topically Once a day   . exemestane (AROMASIN) 25 mg Oral Tablet Take 1 Tablet (25 mg total) by mouth Once a day for 90 days   . montelukast (SINGULAIR) 10 mg Oral Tablet Take 10 mg by mouth Every evening   . multivitamin with iron Oral Tablet Take 1 Tablet by mouth Once a day   . Olmesartan-Hydrochlorothiazide 40-25 mg Oral Tablet Take 1 Tablet by mouth  Every morning   . omeprazole (PRILOSEC) 20 mg Oral Capsule, Delayed Release(E.C.) Take 20 mg by mouth Once a day       ROS:   Patient does not report any other symptoms, besides the ones listed in the HPI, after a complete ROS of 14 systems.     Physical Examination:  BP 121/84 (Site: Right, Patient Position: Sitting, Cuff Size: Adult Large)   Pulse 71   Temp 36.8 C (98.2 F) (Oral)   Ht 1.753 m (5\' 9" )   Wt 123 kg (270 lb 9.6 oz)   SpO2 96%   BMI 39.96 kg/m       General:  Patient is in no acute distress.    Skin:  Normal color, no jaundice, no rash.    Eyes: No congestion, anicteric sclera.    Neck:  No masses, no lymphadenopathies or swelling.    Lungs:  No wheezes, no rales, no labored breathing.    Cardiovascular:  Regular heart rate and rhythm, no peripheral edema.    Abdomen:  Nondistended, soft, no tenderness, no masses.    Musculoskeletal:  No deformities, normal gait.    Neurological: Alert, oriented in person, time and place.  No gross neurological deficit  Psychiatry:  Normal mood and affect.    Lymph nodes:  No cervical or axillary lymphadenopathies.       ECOG Status: 0 - Fully active, able to carry on all pre-disease performance without restriction.         Pathology:  I have reviewed the surgical pathology reports of biopsy and final surgery.  Patient had High grade DCIS, ER + (90%) and PR + (10%).  SLN biopsy was negative (4/4)    Radiology:  DEXA scan on 11/30/20 remarkable for mild osteopenia     Assessment:  1. DCIS, hormone receptor positive  2. 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  She prefers to continue Exemestane instead of using Tamoxifen.  I gave her a prescription for Effexor 37.5 mg po daily to reduce vasomotor side effects.  She should have a mammogram in 04/2021  She is due to have a DEXA scan in 11/2022  She should continue supplementation  with Calcium and vitamin D.    She will move to [place], by the end of this month.        CC:  Roetta Sessions, DO  [address]  [place] [address]          Portions of this note may be dictated using voice recognition software or a dictation service. Variances in spelling and vocabulary are possible and unintentional. Not all errors are caught/corrected. Please notify the [facility] if any discrepancies are noted or if the meaning of any statement is not clear.

## 2021-04-26 ENCOUNTER — Encounter (INDEPENDENT_AMBULATORY_CARE_PROVIDER_SITE_OTHER): Payer: Self-pay | Admitting: Family

## 2021-05-31 ENCOUNTER — Ambulatory Visit: Payer: Self-pay | Admitting: Family Medicine

## 2021-06-04 DIAGNOSIS — J069 Acute upper respiratory infection, unspecified: Secondary | ICD-10-CM | POA: Diagnosis not present

## 2021-07-06 ENCOUNTER — Encounter: Payer: Self-pay | Admitting: Family Medicine

## 2021-07-06 ENCOUNTER — Ambulatory Visit (INDEPENDENT_AMBULATORY_CARE_PROVIDER_SITE_OTHER): Payer: Medicare Other | Admitting: Family Medicine

## 2021-07-06 VITALS — BP 108/69 | HR 92 | Temp 97.9°F | Ht 69.0 in | Wt 267.0 lb

## 2021-07-06 DIAGNOSIS — Z8669 Personal history of other diseases of the nervous system and sense organs: Secondary | ICD-10-CM

## 2021-07-06 DIAGNOSIS — Z9109 Other allergy status, other than to drugs and biological substances: Secondary | ICD-10-CM | POA: Insufficient documentation

## 2021-07-06 DIAGNOSIS — D0512 Intraductal carcinoma in situ of left breast: Secondary | ICD-10-CM

## 2021-07-06 DIAGNOSIS — E782 Mixed hyperlipidemia: Secondary | ICD-10-CM

## 2021-07-06 DIAGNOSIS — I10 Essential (primary) hypertension: Secondary | ICD-10-CM | POA: Diagnosis not present

## 2021-07-06 DIAGNOSIS — R0789 Other chest pain: Secondary | ICD-10-CM | POA: Diagnosis not present

## 2021-07-06 DIAGNOSIS — Z6839 Body mass index (BMI) 39.0-39.9, adult: Secondary | ICD-10-CM

## 2021-07-06 DIAGNOSIS — M1990 Unspecified osteoarthritis, unspecified site: Secondary | ICD-10-CM | POA: Insufficient documentation

## 2021-07-06 LAB — LIPID PANEL

## 2021-07-06 MED ORDER — PREDNISONE 20 MG PO TABS
40.0000 mg | ORAL_TABLET | Freq: Every day | ORAL | 0 refills | Status: AC
Start: 2021-07-06 — End: 2021-07-11

## 2021-07-06 NOTE — Progress Notes (Signed)
Subjective:  Patient ID: Amber Diaz, female    DOB: Jul 06, 1955, 66 y.o.   MRN: [id number]  Patient Care Team: Baruch Gouty, FNP as PCP - General (Family Medicine)   Chief Complaint:  Establish Care   HPI: Amber Diaz is a 66 y.o. female presenting on 07/06/2021 for Establish Care   Patient presents today to establish care with new PCP.  She moved to [place] from [place] in October 2022.  She has not established with any provider since being in the area.  She has a complex medical history with recent diagnosis of left breast cancer in 08/2020 by diagnostic mammogram/US and an US biopsy. 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 She had a lumpectomy and SLN biopsy on 10/03/20, lymphnodes were negative. She underwent radiation therapy and is on Aromasin. She was to have follow up diagnostic mammogram 04/2021, this has not been completed. She also has hypertension, GERD, arthritis, allergies, obesity, and prior history of retinal detachment. She has not established with oncology since arrival to [facility], will place urgent referral today. No recent labs. Her biggest concern today is left pleuritic lateral chest pain. States ongoing for several months. Worse with deep breathing, movement, and palpation. Sharp to shooting in nature, no other associated symptoms.   Relevant past medical, surgical, family, and social history reviewed and updated as indicated.  Allergies and medications reviewed and updated. Data reviewed: Chart in Epic.   Past Medical History:  Diagnosis Date   Breast cancer ([place])    HTN (hypertension)     Past Surgical History:  Procedure Laterality Date   BREAST SURGERY     CESAREAN SECTION     EYE SURGERY     TUBAL LIGATION      Social History   Socioeconomic History   Marital status: Married    Spouse name: Not on file   Number of children: Not on file   Years of education: Not on file   Highest education level: Not on file   Occupational History   Not on file  Tobacco Use   Smoking status: Former    Types: Cigarettes   Smokeless tobacco: Never  Vaping Use   Vaping Use: Never used  Substance and Sexual Activity   Alcohol use: Not Currently   Drug use: Never   Sexual activity: Not Currently  Other Topics Concern   Not on file  Social History Narrative   Not on file   Social Determinants of Health   Financial Resource Strain: Not on file  Food Insecurity: Not on file  Transportation Needs: Not on file  Physical Activity: Not on file  Stress: Not on file  Social Connections: Not on file  Intimate Partner Violence: Not on file    Outpatient Encounter Medications as of 07/06/2021  Medication Sig   baclofen (LIORESAL) 10 MG tablet Take 10 mg by mouth 3 (three) times daily as needed.   betamethasone dipropionate 0.05 % cream Apply topically daily as needed.   exemestane (AROMASIN) 25 MG tablet Take 25 mg by mouth daily after breakfast.   montelukast (SINGULAIR) 10 MG tablet SMARTSIG:1 Tablet(s) By Mouth Every Evening   Multiple Vitamins-Iron (MULTIVITAMINS WITH IRON) TABS tablet Take 1 tablet by mouth daily.   olmesartan-hydrochlorothiazide (BENICAR HCT) 40-25 MG tablet Take 1 tablet by mouth every morning.   omeprazole (PRILOSEC) 20 MG capsule Take by mouth.   predniSONE (DELTASONE) 20 MG tablet Take 2 tablets (40 mg total)  by mouth daily with breakfast for 5 days.   [DISCONTINUED] ibuprofen (ADVIL) 800 MG tablet Take 800 mg by mouth 3 (three) times daily as needed.   [DISCONTINUED] omeprazole (PRILOSEC) 40 MG capsule Take 40 mg by mouth daily.   No facility-administered encounter medications on file as of 07/06/2021.    Review of Systems  Constitutional:  Negative for activity change, appetite change, chills, diaphoresis, fatigue, fever and unexpected weight change.  HENT: Negative.    Eyes: Negative.  Negative for photophobia and visual disturbance.  Respiratory:  Negative for cough, chest  tightness and shortness of breath.   Cardiovascular:  Positive for chest pain (chest wall). Negative for palpitations and leg swelling.  Gastrointestinal:  Negative for abdominal pain, blood in stool, constipation, diarrhea, nausea and vomiting.  Endocrine: Negative.   Genitourinary:  Negative for decreased urine volume, difficulty urinating, dysuria, frequency and urgency.  Musculoskeletal:  Positive for myalgias. Negative for arthralgias.  Skin: Negative.   Allergic/Immunologic: Negative.   Neurological:  Negative for dizziness, weakness and headaches.  Hematological: Negative.   Psychiatric/Behavioral:  Negative for confusion, hallucinations, sleep disturbance and suicidal ideas.   All other systems reviewed and are negative.      Objective:  BP 108/69    Pulse 92    Temp 97.9 F (36.6 C) (Temporal)    Ht $R'5\' 9"'rL$  (1.753 m)    Wt 267 lb (121.1 kg)    SpO2 96%    BMI 39.43 kg/m    Wt Readings from Last 3 Encounters:  07/06/21 267 lb (121.1 kg)    Physical Exam Vitals and nursing note reviewed.  Constitutional:      General: She is not in acute distress.    Appearance: Normal appearance. She is obese. She is not ill-appearing, toxic-appearing or diaphoretic.  HENT:     Head: Normocephalic and atraumatic.  Eyes:     Conjunctiva/sclera: Conjunctivae normal.     Pupils: Pupils are equal, round, and reactive to light.  Cardiovascular:     Rate and Rhythm: Normal rate and regular rhythm.     Heart sounds: Normal heart sounds. No murmur heard.   No friction rub. No gallop.  Pulmonary:     Effort: Pulmonary effort is normal.     Breath sounds: Normal breath sounds.  Chest:     Chest wall: Tenderness present. No mass, lacerations, deformity, swelling, crepitus or edema. There is no dullness to percussion.    Abdominal:     General: Bowel sounds are normal.     Palpations: Abdomen is soft.  Musculoskeletal:     Right lower leg: No edema.     Left lower leg: No edema.  Skin:     General: Skin is warm and dry.  Neurological:     General: No focal deficit present.     Mental Status: She is alert and oriented to person, place, and time.  Psychiatric:        Mood and Affect: Mood normal.        Thought Content: Thought content normal.    No results found for this or any previous visit.     Pertinent labs & imaging results that were available during my care of the patient were reviewed by me and considered in my medical decision making.  Assessment & Plan:  Amber Diaz was seen today for establish care.  Diagnoses and all orders for this visit:  Ductal carcinoma in situ (DCIS) of left breast Urgent referral for diagnostic mammogram and to  oncology.  -     Ambulatory referral to Hematology / Oncology -     MM Digital Diagnostic Bilat; Future  BMI 39.0-39.9,adult Will obtain baseline labs. Diet and exercise encouraged.  -     CBC with Differential/Platelet -     CMP14+EGFR -     Lipid panel -     Thyroid Panel With TSH  Primary hypertension Well controlled on current regimen. DASH diet and exercise encouraged. Will obtain labs.  -     CBC with Differential/Platelet -     CMP14+EGFR -     Lipid panel -     Thyroid Panel With TSH  History of retinal detachment Pt to establish with opthalmology in [place].    Costochondral chest pain No red flags concerning for ACS, PE, PNX, or PNA. Classic costochondritis. Will burst with steroids to see if beneficial. Xray not available in office. Today. Pt aware to report any new, worsening, or persistent symptoms. Follow up in 4 weeks for reevaluation or sooner if warranted. Red flags discussed with pt.  -     predniSONE (DELTASONE) 20 MG tablet; Take 2 tablets (40 mg total) by mouth daily with breakfast for 5 days.     Continue all other maintenance medications.  Follow up plan: Return in about 4 weeks (around 08/03/2021), or if symptoms worsen or fail to improve, for costochondritis.   Continue healthy lifestyle  choices, including diet (rich in fruits, vegetables, and lean proteins, and low in salt and simple carbohydrates) and exercise (at least 30 minutes of moderate physical activity daily).  Educational handout given for costochondritis  The above assessment and management plan was discussed with the patient. The patient verbalized understanding of and has agreed to the management plan. Patient is aware to call the clinic if they develop any new symptoms or if symptoms persist or worsen. Patient is aware when to return to the clinic for a follow-up visit. Patient educated on when it is appropriate to go to the emergency department.   Monia Pouch, FNP-C [facility] [phone]

## 2021-07-07 LAB — CMP14+EGFR
ALT: 23 IU/L (ref 0–32)
AST: 21 IU/L (ref 0–40)
Albumin/Globulin Ratio: 1.4 (ref 1.2–2.2)
Albumin: 4.1 g/dL (ref 3.8–4.8)
Alkaline Phosphatase: 98 IU/L (ref 44–121)
BUN/Creatinine Ratio: 19 (ref 12–28)
BUN: 17 mg/dL (ref 8–27)
Bilirubin Total: 0.3 mg/dL (ref 0.0–1.2)
CO2: 23 mmol/L (ref 20–29)
Calcium: 10.8 mg/dL — ABNORMAL HIGH (ref 8.7–10.3)
Chloride: 102 mmol/L (ref 96–106)
Creatinine, Ser: 0.88 mg/dL (ref 0.57–1.00)
Globulin, Total: 3 g/dL (ref 1.5–4.5)
Glucose: 85 mg/dL (ref 70–99)
Potassium: 4.2 mmol/L (ref 3.5–5.2)
Sodium: 138 mmol/L (ref 134–144)
Total Protein: 7.1 g/dL (ref 6.0–8.5)
eGFR: 73 mL/min/{1.73_m2} (ref >59)

## 2021-07-07 LAB — THYROID PANEL WITH TSH
Free Thyroxine Index: 1.4 (ref 1.2–4.9)
T3 Uptake Ratio: 25 % (ref 24–39)
T4, Total: 5.7 ug/dL (ref 4.5–12.0)
TSH: 1.23 u[IU]/mL (ref 0.450–4.500)

## 2021-07-07 LAB — CBC WITH DIFFERENTIAL/PLATELET
Basophils Absolute: 0.1 10*3/uL (ref 0.0–0.2)
Basos: 1 %
EOS (ABSOLUTE): 0.2 10*3/uL (ref 0.0–0.4)
Eos: 3 %
Hematocrit: 41.3 % (ref 34.0–46.6)
Hemoglobin: 13.7 g/dL (ref 11.1–15.9)
Immature Grans (Abs): 0 10*3/uL (ref 0.0–0.1)
Immature Granulocytes: 0 %
Lymphocytes Absolute: 2 10*3/uL (ref 0.7–3.1)
Lymphs: 27 %
MCH: 28.7 pg (ref 26.6–33.0)
MCHC: 33.2 g/dL (ref 31.5–35.7)
MCV: 87 fL (ref 79–97)
Monocytes Absolute: 0.6 10*3/uL (ref 0.1–0.9)
Monocytes: 8 %
Neutrophils Absolute: 4.7 10*3/uL (ref 1.4–7.0)
Neutrophils: 61 %
Platelets: 282 10*3/uL (ref 150–450)
RBC: 4.77 x10E6/uL (ref 3.77–5.28)
RDW: 13.1 % (ref 11.7–15.4)
WBC: 7.6 10*3/uL (ref 3.4–10.8)

## 2021-07-07 LAB — LIPID PANEL
Chol/HDL Ratio: 6.1 ratio — ABNORMAL HIGH (ref 0.0–4.4)
Cholesterol, Total: 227 mg/dL — ABNORMAL HIGH (ref 100–199)
HDL: 37 mg/dL — ABNORMAL LOW (ref >39)
LDL Chol Calc (NIH): 145 mg/dL — ABNORMAL HIGH (ref 0–99)
Triglycerides: 244 mg/dL — ABNORMAL HIGH (ref 0–149)
VLDL Cholesterol Cal: 45 mg/dL — ABNORMAL HIGH (ref 5–40)

## 2021-07-09 ENCOUNTER — Telehealth: Payer: Self-pay | Admitting: Family Medicine

## 2021-07-09 ENCOUNTER — Ambulatory Visit (INDEPENDENT_AMBULATORY_CARE_PROVIDER_SITE_OTHER): Payer: Medicare Other | Admitting: *Deleted

## 2021-07-09 DIAGNOSIS — Z0000 Encounter for general adult medical examination without abnormal findings: Secondary | ICD-10-CM | POA: Diagnosis not present

## 2021-07-09 NOTE — Patient Instructions (Signed)
°  [facility] Maintenance Summary and Written Plan of Care  [name] ,  Thank you for allowing me to perform your Medicare Annual Wellness Visit and for your ongoing commitment to your health.   Health Maintenance & Immunization History Health Maintenance  Topic Date Due   PAP SMEAR-Modifier  Never done   COLONOSCOPY (Pts 45-32yrs Insurance coverage will need to be confirmed)  Never done   MAMMOGRAM  Never done   DEXA SCAN  Never done   COVID-19 Vaccine (2 - Pfizer risk series) 07/22/2021 (Originally 02/04/2020)   INFLUENZA VACCINE  08/24/2021 (Originally 12/25/2020)   Zoster Vaccines- Shingrix (1 of 2) 10/03/2021 (Originally 03/04/1975)   Pneumonia Vaccine 46+ Years old (1 - PCV) 07/06/2022 (Originally 03/03/2021)   TETANUS/TDAP  07/06/2022 (Originally 03/04/1975)   Hepatitis C Screening  07/09/2022 (Originally 03/03/1974)   HIV Screening  07/09/2022 (Originally 03/04/1971)   HPV VACCINES  Aged Out   Immunization History  Administered Date(s) Administered   PFIZER(Purple Top)SARS-COV-2 Vaccination 01/14/2020    These are the patient goals that we discussed:  Goals Addressed             This Visit's Progress    AWV       07/09/2021 AWV Goal: Exercise for General Health  Patient will verbalize understanding of the benefits of increased physical activity: Exercising regularly is important. It will improve your overall fitness, flexibility, and endurance. Regular exercise also will improve your overall health. It can help you control your weight, reduce stress, and improve your bone density. Over the next year, patient will increase physical activity as tolerated with a goal of at least 150 minutes of moderate physical activity per week.  You can tell that you are exercising at a moderate intensity if your heart starts beating faster and you start breathing faster but can still hold a conversation. Moderate-intensity exercise ideas include: Walking 1 mile  (1.6 km) in about 15 minutes Biking Hiking Golfing Dancing Water aerobics Patient will verbalize understanding of everyday activities that increase physical activity by providing examples like the following: Yard work, such as: Sales promotion account executive Gardening Washing windows or floors Patient will be able to explain general safety guidelines for exercising:  Before you start a new exercise program, talk with your health care provider. Do not exercise so much that you hurt yourself, feel dizzy, or get very short of breath. Wear comfortable clothes and wear shoes with good support. Drink plenty of water while you exercise to prevent dehydration or heat stroke. Work out until your breathing and your heartbeat get faster.          This is a list of Health Maintenance Items that are overdue or due now: Health Maintenance Due  Topic Date Due   PAP SMEAR-Modifier  Never done   COLONOSCOPY (Pts 45-82yrs Insurance coverage will need to be confirmed)  Never done   MAMMOGRAM  Never done   DEXA SCAN  Never done     Orders/Referrals Placed Today: No orders of the defined types were placed in this encounter.  (Contact our referral department at [phone] if you have not spoken with someone about your referral appointment within the next 5 days)    Follow-up Plan Follow-up with Rakes, Connye Burkitt, FNP as planned Our referral department will contact you in regards to mammogram and Oncology referral.

## 2021-07-09 NOTE — Progress Notes (Signed)
MEDICARE ANNUAL WELLNESS VISIT  07/09/2021  Telephone Visit Disclaimer This Medicare AWV was conducted by telephone due to national recommendations for restrictions regarding the COVID-19 Pandemic (e.g. social distancing).  I verified, using two identifiers, that I am speaking with Amber Diaz or their authorized healthcare agent. I discussed the limitations, risks, security, and privacy concerns of performing an evaluation and management service by telephone and the potential availability of an in-person appointment in the future. The patient expressed understanding and agreed to proceed.  Location of Patient: Home Location of Provider (nurse):  [facility]  Subjective:    Amber Diaz is a 66 y.o. female patient of Amber Diaz, Amber Diaz, Amber Diaz who had a Medicare Annual Wellness Visit today via telephone. Amber Diaz is Retired and lives with their spouse. she has 1 children. she reports that she is socially active and does interact with friends/family regularly. she is minimally physically active and enjoys gardening, reading, and cooking.  Patient Care Team: Amber Gouty, FNP as PCP - General (Family Medicine)  Advanced Directives 07/09/2021  Does Patient Have a Medical Advance Directive? Yes  Type of Paramedic of [name];Living will  Does patient want to make changes to medical advance directive? No - Patient declined  Copy of Amber Diaz in Chart? No - copy requested    Hospital Utilization Over the Past 12 Months: # of hospitalizations or ER visits: 0 # of surgeries: 1  Review of Systems    Patient reports that her overall health is unchanged compared to last year.  Musculoskeletal ROS: positive for - pain in back - lower, foot - bilateral, hip - bilateral, leg - bilateral, and shoulder - bilateral  Patient Reported Readings (BP, Pulse, CBG, Weight, etc) none  Pain Assessment Pain : 0-10 Pain Score: 6  Pain Type:  Chronic pain Pain Onset: More than a month ago Pain Frequency: Constant     Current Medications & Allergies (verified) Allergies as of 07/09/2021   Not on File      Medication List        Accurate as of July 09, 2021  2:15 PM. If you have any questions, ask your nurse or doctor.          baclofen 10 MG tablet Commonly known as: LIORESAL Take 10 mg by mouth 3 (three) times daily as needed.   betamethasone dipropionate 0.05 % cream Apply topically daily as needed.   exemestane 25 MG tablet Commonly known as: AROMASIN Take 25 mg by mouth daily after breakfast.   montelukast 10 MG tablet Commonly known as: SINGULAIR SMARTSIG:1 Tablet(s) By Mouth Every Evening   multivitamins with iron Tabs tablet Take 1 tablet by mouth daily.   olmesartan-hydrochlorothiazide 40-25 MG tablet Commonly known as: BENICAR HCT Take 1 tablet by mouth every morning.   omeprazole 20 MG capsule Commonly known as: PRILOSEC Take by mouth.   predniSONE 20 MG tablet Commonly known as: DELTASONE Take 2 tablets (40 mg total) by mouth daily with breakfast for 5 days.        History (reviewed): Past Medical History:  Diagnosis Date   Breast cancer ([place])    HTN (hypertension)    Psoriasis    Past Surgical History:  Procedure Laterality Date   BREAST SURGERY     CESAREAN SECTION     EYE SURGERY     TUBAL LIGATION     Family History  Problem Relation Age of Onset   Breast cancer  Mother    Heart attack Mother    Bone cancer Father    Obesity Son    Social History   Socioeconomic History   Marital status: Married    Spouse name: Not on file   Number of children: 1   Years of education: Not on file   Highest education level: 12th grade  Occupational History   Not on file  Tobacco Use   Smoking status: Former    Types: Cigarettes   Smokeless tobacco: Never  Vaping Use   Vaping Use: Never used  Substance and Sexual Activity   Alcohol use: Not Currently   Drug use:  Never   Sexual activity: Not Currently  Other Topics Concern   Not on file  Social History Narrative   Not on file   Social Determinants of Health   Financial Resource Strain: Not on file  Food Insecurity: Not on file  Transportation Needs: Not on file  Physical Activity: Not on file  Stress: Not on file  Social Connections: Not on file    Activities of Daily Living In your present state of health, do you have any difficulty performing the following activities: 07/09/2021  Hearing? Y  Comment A little  Vision? Y  Comment Floaters in left eye  Difficulty concentrating or making decisions? N  Walking or climbing stairs? Y  Comment At times due to pain  Dressing or bathing? N  Doing errands, shopping? N  Preparing Food and eating ? N  Using the Toilet? N  In the past six months, have you accidently leaked urine? Y  Comment at times  Do you have problems with loss of bowel control? N  Managing your Medications? N  Managing your Finances? N  Housekeeping or managing your Housekeeping? N    Patient Education/ Literacy How often do you need to have someone help you when you read instructions, pamphlets, or other written materials from your doctor or pharmacy?: 1 - Never What is the last grade level you completed in school?: 12  Exercise Current Exercise Habits: The patient does not participate in regular exercise at present, Exercise limited by: orthopedic condition(s) (Due to pain)  Diet Patient reports consuming 2 meals a day and 2 snack(s) a day Patient reports that her primary diet is: Regular Patient reports that she does have regular access to food.   Depression Screen PHQ 2/9 Scores 07/09/2021  PHQ - 2 Score 0     Fall Risk Fall Risk  07/09/2021 07/06/2021  Falls in the past year? 0 0  Number falls in past yr: 0 -  Injury with Fall? 0 -  Risk for fall due to : No Fall Risks -  Follow up Falls prevention discussed -     Objective:  Amber Diaz seemed alert  and oriented and she participated appropriately during our telephone visit.  Blood Pressure Weight BMI  BP Readings from Last 3 Encounters:  07/06/21 108/69   Wt Readings from Last 3 Encounters:  07/06/21 267 lb (121.1 kg)   BMI Readings from Last 1 Encounters:  07/06/21 39.43 kg/m    *Unable to obtain current vital signs, weight, and BMI due to telephone visit type  Hearing/Vision  [place] did not seem to have difficulty with hearing/understanding during the telephone conversation Reports that she has not had a formal eye exam by an eye care professional within the past year Reports that she has not had a formal hearing evaluation within the past year *Unable to fully  assess hearing and vision during telephone visit type  Cognitive Function: 6CIT Screen 07/09/2021  What Year? 0 points  What month? 0 points  What time? 0 points  Count back from 20 0 points  Months in reverse 0 points  Repeat phrase 0 points  Total Score 0   (Normal:0-7, Significant for Dysfunction: >8)  Normal Cognitive Function Screening: Yes   Immunization & Health Maintenance Record Immunization History  Administered Date(s) Administered   PFIZER(Purple Top)SARS-COV-2 Vaccination 01/14/2020    Health Maintenance  Topic Date Due   PAP SMEAR-Modifier  Never done   COLONOSCOPY (Pts 45-58yrs Insurance coverage will need to be confirmed)  Never done   MAMMOGRAM  Never done   DEXA SCAN  Never done   COVID-19 Vaccine (2 - Pfizer risk series) 07/22/2021 (Originally 02/04/2020)   INFLUENZA VACCINE  08/24/2021 (Originally 12/25/2020)   Zoster Vaccines- Shingrix (1 of 2) 10/03/2021 (Originally 03/04/1975)   Pneumonia Vaccine 70+ Years old (1 - PCV) 07/06/2022 (Originally 03/03/2021)   TETANUS/TDAP  07/06/2022 (Originally 03/04/1975)   Hepatitis C Screening  07/09/2022 (Originally 03/03/1974)   HIV Screening  07/09/2022 (Originally 03/04/1971)   HPV VACCINES  Aged Out       Assessment  This is a routine wellness  examination for [employer].  Health Maintenance: Due or Overdue Health Maintenance Due  Topic Date Due   PAP SMEAR-Modifier  Never done   COLONOSCOPY (Pts 45-38yrs Insurance coverage will need to be confirmed)  Never done   MAMMOGRAM  Never done   DEXA SCAN  Never done    Amber Diaz does not need a referral for Community Assistance: Care Management:   no Social Work:    no Prescription Assistance:  no Nutrition/Diabetes Education:  no   Plan:  Personalized Goals  Goals Addressed             This Visit's Progress    AWV       07/09/2021 AWV Goal: Exercise for General Health  Patient will verbalize understanding of the benefits of increased physical activity: Exercising regularly is important. It will improve your overall fitness, flexibility, and endurance. Regular exercise also will improve your overall health. It can help you control your weight, reduce stress, and improve your bone density. Over the next year, patient will increase physical activity as tolerated with a goal of at least 150 minutes of moderate physical activity per week.  You can tell that you are exercising at a moderate intensity if your heart starts beating faster and you start breathing faster but can still hold a conversation. Moderate-intensity exercise ideas include: Walking 1 mile (1.6 km) in about 15 minutes Biking Hiking Golfing Dancing Water aerobics Patient will verbalize understanding of everyday activities that increase physical activity by providing examples like the following: Yard work, such as: Sales promotion account executive Gardening Washing windows or floors Patient will be able to explain general safety guidelines for exercising:  Before you start a new exercise program, talk with your health care provider. Do not exercise so much that you hurt yourself, feel dizzy, or get very short of breath. Wear  comfortable clothes and wear shoes with good support. Drink plenty of water while you exercise to prevent dehydration or heat stroke. Work out until your breathing and your heartbeat get faster.        Personalized Health Maintenance & Screening Recommendations  Colorectal cancer screening  Lung Cancer Screening Recommended:  no (Low Dose CT Chest recommended if Age 71-80 years, 30 pack-year currently smoking OR have quit w/in past 15 years) Hepatitis C Screening recommended: no HIV Screening recommended: no  Advanced Directives: Written information was not prepared per patient's request.  Referrals & Orders No orders of the defined types were placed in this encounter.   Follow-up Plan Follow-up with Amber Diaz, Amber Burkitt, FNP as planned Our referral department will contact you in regards to mammogram and Oncology referral at [facility].   I have personally reviewed and noted the following in the patients chart:   Medical and social history Use of alcohol, tobacco or illicit drugs  Current medications and supplements Functional ability and status Nutritional status Physical activity Advanced directives List of other physicians Hospitalizations, surgeries, and ER visits in previous 12 months Vitals Screenings to include cognitive, depression, and falls Referrals and appointments  In addition, I have reviewed and discussed with Amber Diaz certain preventive protocols, quality metrics, and best practice recommendations. A written personalized care plan for preventive services as well as general preventive health recommendations is available and can be mailed to the patient at her request.      Gareth Morgan  07/09/2021

## 2021-07-09 NOTE — Telephone Encounter (Signed)
Amber Diaz is getting pt scheduled at [facility].

## 2021-07-10 DIAGNOSIS — E782 Mixed hyperlipidemia: Secondary | ICD-10-CM | POA: Insufficient documentation

## 2021-07-10 MED ORDER — ATORVASTATIN CALCIUM 20 MG PO TABS: 20.0000 mg | ORAL_TABLET | Freq: Every day | ORAL | 3 refills | Status: DC

## 2021-07-10 NOTE — Addendum Note (Signed)
Addended by: Baruch Gouty on: 07/10/2021 01:04 PM   Modules accepted: Orders

## 2021-07-16 ENCOUNTER — Other Ambulatory Visit: Payer: Self-pay

## 2021-07-16 DIAGNOSIS — D0512 Intraductal carcinoma in situ of left breast: Secondary | ICD-10-CM

## 2021-07-17 ENCOUNTER — Other Ambulatory Visit: Payer: Self-pay

## 2021-07-17 ENCOUNTER — Inpatient Hospital Stay (HOSPITAL_COMMUNITY): Payer: Medicare Other | Attending: Hematology | Admitting: Hematology

## 2021-07-17 VITALS — BP 125/63 | HR 74 | Temp 98.4°F | Resp 20 | Ht 67.32 in | Wt 266.5 lb

## 2021-07-17 DIAGNOSIS — Z8249 Family history of ischemic heart disease and other diseases of the circulatory system: Secondary | ICD-10-CM | POA: Diagnosis not present

## 2021-07-17 DIAGNOSIS — R232 Flushing: Secondary | ICD-10-CM | POA: Diagnosis not present

## 2021-07-17 DIAGNOSIS — Z8349 Family history of other endocrine, nutritional and metabolic diseases: Secondary | ICD-10-CM | POA: Diagnosis not present

## 2021-07-17 DIAGNOSIS — M25551 Pain in right hip: Secondary | ICD-10-CM | POA: Insufficient documentation

## 2021-07-17 DIAGNOSIS — Z79811 Long term (current) use of aromatase inhibitors: Secondary | ICD-10-CM | POA: Diagnosis not present

## 2021-07-17 DIAGNOSIS — Z87891 Personal history of nicotine dependence: Secondary | ICD-10-CM | POA: Diagnosis not present

## 2021-07-17 DIAGNOSIS — D0512 Intraductal carcinoma in situ of left breast: Secondary | ICD-10-CM | POA: Diagnosis not present

## 2021-07-17 DIAGNOSIS — M8580 Other specified disorders of bone density and structure, unspecified site: Secondary | ICD-10-CM | POA: Insufficient documentation

## 2021-07-17 DIAGNOSIS — Z808 Family history of malignant neoplasm of other organs or systems: Secondary | ICD-10-CM | POA: Diagnosis not present

## 2021-07-17 DIAGNOSIS — M25552 Pain in left hip: Secondary | ICD-10-CM | POA: Insufficient documentation

## 2021-07-17 DIAGNOSIS — M25519 Pain in unspecified shoulder: Secondary | ICD-10-CM | POA: Diagnosis not present

## 2021-07-17 DIAGNOSIS — Z79899 Other long term (current) drug therapy: Secondary | ICD-10-CM | POA: Insufficient documentation

## 2021-07-17 DIAGNOSIS — M25539 Pain in unspecified wrist: Secondary | ICD-10-CM | POA: Insufficient documentation

## 2021-07-17 DIAGNOSIS — I10 Essential (primary) hypertension: Secondary | ICD-10-CM | POA: Diagnosis not present

## 2021-07-17 DIAGNOSIS — Z803 Family history of malignant neoplasm of breast: Secondary | ICD-10-CM | POA: Diagnosis not present

## 2021-07-17 MED ORDER — LETROZOLE 2.5 MG PO TABS: 2.5000 mg | ORAL_TABLET | Freq: Every day | ORAL | 3 refills | Status: DC

## 2021-07-17 NOTE — Progress Notes (Signed)
[facility] [address], [place] [address]   Patient Care Team: Rakes, Connye Burkitt, [place] as PCP - General (Family Medicine) Derek Jack, MD as Medical Oncologist (Medical Oncology)  CHIEF COMPLAINTS/PURPOSE OF CONSULTATION:  Newly diagnosed left breast DCIS  HISTORY OF PRESENTING ILLNESS:  Amber Diaz 66 y.o. female is here because of recent diagnosis of left breast DCIS.  Today she reports feeling well. She started Aromasin in August 2022. She has stopped taking Aromasin 10 days ago as she reports her BP had been elevated since early February when monitoring it at home; she reports it was around 160/90 when taking Aromasin, and since stopping Aromasin her BP returned to normal. She previously took anastrozole, and she did not experience similar symptoms while taking that medication. She reports a history of HTN and joint pains in her ankles, knees, shoulders, wrists, and hips. She started having hot flashes in August 2022 after starting Aromasin, and they have not stopped since stopping Aromasin. She reports these hot flashes are tolerable, and she has never taken Effexor. She does not take calcium or vitamin D, but she takes a multivitamin which she reports may contain calcium or vitamin D. She reports previous hypercalcemia after taking calcium tablets following a fracture in her left foot, but she reports that has resolved and not recurred. She reports her left nipple has been inverted prior to surgery. She has had 4 biopsies on her right breast in 1986, 2006, 2021, and she cannot recall the fourth date, all of which were benign.   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 She smoked 2 ppd from ages [age]. Her maternal grand mother had breast cancer at age 64, and her mother had breast cancer at ages 60 and again at 45. Her father had spine and neck cancer.   In terms of breast cancer risk profile:  She menarched at early age of 20 and went to menopause in her  late 70s.  She had 1 pregnancy, her first child was born at age 24  She had received birth control pills for approximately 4-5 years.  She was never exposed to fertility medications or hormone replacement therapy.  She has family history of Breast/GYN/GI cancer  I reviewed her records extensively and collaborated the history with the patient.  SUMMARY OF ONCOLOGIC HISTORY: Oncology History  Ductal carcinoma in situ (DCIS) of left breast  07/06/2021 Initial Diagnosis   Ductal carcinoma in situ (DCIS) of left breast   07/17/2021 Cancer Staging   Staging form: Breast, AJCC 8th Edition - Clinical stage from 07/17/2021: Stage 0 (cTis (DCIS), cN0, cM0, G3, ER+, PR+, HER2: Not Assessed) - Signed by Derek Jack, MD on 07/17/2021 Stage prefix: Initial diagnosis Nuclear grade: G3 Histologic grading system: 3 grade system      MEDICAL HISTORY:  Past Medical History:  Diagnosis Date   Breast cancer ([place])    HTN (hypertension)    Psoriasis     SURGICAL HISTORY: Past Surgical History:  Procedure Laterality Date   BREAST SURGERY     CESAREAN SECTION     EYE SURGERY     TUBAL LIGATION      SOCIAL HISTORY: Social History   Socioeconomic History   Marital status: Married    Spouse name: Not on file   Number of children: 1   Years of education: Not on file   Highest education level: 12th grade  Occupational History   Not on file  Tobacco Use  Smoking status: Former    Types: Cigarettes   Smokeless tobacco: Never  Vaping Use   Vaping Use: Never used  Substance and Sexual Activity   Alcohol use: Not Currently   Drug use: Never   Sexual activity: Not Currently  Other Topics Concern   Not on file  Social History Narrative   Not on file   Social Determinants of Health   Financial Resource Strain: Not on file  Food Insecurity: Not on file  Transportation Needs: Not on file  Physical Activity: Not on file  Stress: Not on file  Social Connections: Not on file   Intimate Partner Violence: Not on file    FAMILY HISTORY: Family History  Problem Relation Age of Onset   Breast cancer Mother    Heart attack Mother    Bone cancer Father    Obesity Son     ALLERGIES:  has no allergies on file.  MEDICATIONS:  Current Outpatient Medications  Medication Sig Dispense Refill   letrozole (FEMARA) 2.5 MG tablet Take 1 tablet (2.5 mg total) by mouth daily. 90 tablet 3   atorvastatin (LIPITOR) 20 MG tablet Take 1 tablet (20 mg total) by mouth at bedtime. 90 tablet 3   baclofen (LIORESAL) 10 MG tablet Take 10 mg by mouth 3 (three) times daily as needed.     betamethasone dipropionate 0.05 % cream Apply topically daily as needed.     exemestane (AROMASIN) 25 MG tablet Take 25 mg by mouth daily after breakfast.     montelukast (SINGULAIR) 10 MG tablet SMARTSIG:1 Tablet(s) By Mouth Every Evening     Multiple Vitamins-Iron (MULTIVITAMINS WITH IRON) TABS tablet Take 1 tablet by mouth daily.     olmesartan-hydrochlorothiazide (BENICAR HCT) 40-25 MG tablet Take 1 tablet by mouth every morning.     omeprazole (PRILOSEC) 20 MG capsule Take by mouth.     No current facility-administered medications for this visit.    REVIEW OF SYSTEMS:   Review of Systems  Constitutional:  Positive for fatigue. Negative for appetite change.  Respiratory:  Positive for cough and shortness of breath.   Endocrine: Positive for hot flashes.  Musculoskeletal:  Positive for arthralgias (6/10).  Neurological:  Positive for headaches and numbness.  Psychiatric/Behavioral:  Positive for sleep disturbance.   All other systems reviewed and are negative.  PHYSICAL EXAMINATION: ECOG PERFORMANCE STATUS: 1 - Symptomatic but completely ambulatory  Vitals:   07/17/21 0821  BP: 125/63  Pulse: 74  Resp: 20  Temp: 98.4 F (36.9 C)  SpO2: 100%   Filed Weights   07/17/21 0821  Weight: 266 lb 8.6 oz (120.9 kg)   Physical Exam Vitals reviewed.  Constitutional:      Appearance:  Normal appearance. She is obese.  Cardiovascular:     Rate and Rhythm: Normal rate and regular rhythm.     Pulses: Normal pulses.     Heart sounds: Normal heart sounds.  Pulmonary:     Effort: Pulmonary effort is normal.     Breath sounds: Normal breath sounds.  Chest:  Breasts:    Right: No swelling, bleeding, inverted nipple, mass, nipple discharge, skin change (scar in UOQ @ 9:00) or tenderness.     Left: Inverted nipple present. No swelling, bleeding, mass, skin change (lumpectomy scar LOQ WNL) or tenderness.  Lymphadenopathy:     Upper Body:     Right upper body: No supraclavicular, axillary or pectoral adenopathy.     Left upper body: No supraclavicular, axillary or pectoral adenopathy.  Neurological:     General: No focal deficit present.     Mental Status: She is alert and oriented to person, place, and time.  Psychiatric:        Mood and Affect: Mood normal.        Behavior: Behavior normal.    Breast Exam Chaperone: Thana Ates    LABORATORY DATA:  I have reviewed the data as listed Recent Results (from the past 2160 hour(s))  CBC with Differential/Platelet     Status: None   Collection Time: 07/06/21 11:36 AM  Result Value Ref Range   WBC 7.6 3.4 - 10.8 x10E3/uL   RBC 4.77 3.77 - 5.28 x10E6/uL   Hemoglobin 13.7 11.1 - 15.9 g/dL   Hematocrit 41.3 34.0 - 46.6 %   MCV 87 79 - 97 fL   MCH 28.7 26.6 - 33.0 pg   MCHC 33.2 31.5 - 35.7 g/dL   RDW 13.1 11.7 - 15.4 %   Platelets 282 150 - 450 x10E3/uL   Neutrophils 61 Not Estab. %   Lymphs 27 Not Estab. %   Monocytes 8 Not Estab. %   Eos 3 Not Estab. %   Basos 1 Not Estab. %   Neutrophils Absolute 4.7 1.4 - 7.0 x10E3/uL   Lymphocytes Absolute 2.0 0.7 - 3.1 x10E3/uL   Monocytes Absolute 0.6 0.1 - 0.9 x10E3/uL   EOS (ABSOLUTE) 0.2 0.0 - 0.4 x10E3/uL   Basophils Absolute 0.1 0.0 - 0.2 x10E3/uL   Immature Granulocytes 0 Not Estab. %   Immature Grans (Abs) 0.0 0.0 - 0.1 x10E3/uL  CMP14+EGFR     Status: Abnormal    Collection Time: 07/06/21 11:36 AM  Result Value Ref Range   Glucose 85 70 - 99 mg/dL   BUN 17 8 - 27 mg/dL   Creatinine, Ser 0.88 0.57 - 1.00 mg/dL   eGFR 73 >59 mL/min/1.73   BUN/Creatinine Ratio 19 12 - 28   Sodium 138 134 - 144 mmol/L   Potassium 4.2 3.5 - 5.2 mmol/L   Chloride 102 96 - 106 mmol/L   CO2 23 20 - 29 mmol/L   Calcium 10.8 (H) 8.7 - 10.3 mg/dL   Total Protein 7.1 6.0 - 8.5 g/dL   Albumin 4.1 3.8 - 4.8 g/dL   Globulin, Total 3.0 1.5 - 4.5 g/dL   Albumin/Globulin Ratio 1.4 1.2 - 2.2   Bilirubin Total 0.3 0.0 - 1.2 mg/dL   Alkaline Phosphatase 98 44 - 121 IU/L   AST 21 0 - 40 IU/L   ALT 23 0 - 32 IU/L  Lipid panel     Status: Abnormal   Collection Time: 07/06/21 11:36 AM  Result Value Ref Range   Cholesterol, Total 227 (H) 100 - 199 mg/dL   Triglycerides 244 (H) 0 - 149 mg/dL   HDL 37 (L) >39 mg/dL   VLDL Cholesterol Cal 45 (H) 5 - 40 mg/dL   LDL Chol Calc (NIH) 145 (H) 0 - 99 mg/dL   Chol/HDL Ratio 6.1 (H) 0.0 - 4.4 ratio    Comment:                                   T. Chol/HDL Ratio  Men  Women                               1/2 Avg.Risk  3.4    3.3                                   Avg.Risk  5.0    4.4                                2X Avg.Risk  9.6    7.1                                3X Avg.Risk 23.4   11.0   Thyroid Panel With TSH     Status: None   Collection Time: 07/06/21 11:36 AM  Result Value Ref Range   TSH 1.230 0.450 - 4.500 uIU/mL   T4, Total 5.7 4.5 - 12.0 ug/dL   T3 Uptake Ratio 25 24 - 39 %   Free Thyroxine Index 1.4 1.2 - 4.9    RADIOGRAPHIC STUDIES: I have personally reviewed the radiological reports and agreed with the findings in the report. No results found.   ASSESSMENT:  Left breast high-grade DCIS, ER positive: - Screening mammogram on 08/12/2020 with asymmetry in the outer left breast in [place], [place]. - Diagnostic mammogram/ultrasound and US biopsy on 09/05/2020, pathology  consistent with high-grade DCIS, ER/PR positive. - Lumpectomy and SLN biopsy on 10/03/2020 - Pathology with 1.4 cm DCIS, margins negative, 4/4 lymph nodes negative. - Status post XRT to the left breast - She was reportedly negative for genetic testing.  She had previous right breast biopsies x4 (1996, 2006, 2021). - She was started on anastrozole, developed nausea, vomiting and abdominal pain - Exemestane started on 01/10/2021, discontinued in the first week of February 2023 secondary to hypertension. - Letrozole started on 07/17/2021. - She has some hot flashes which started after taking anastrozole but never went away.  They are tolerable. - She also has joint pains in the ankles, knees, hips, shoulders and wrists which are chronic and stable.   Social/family history: - She is retired Glass blower/designer.  Recently moved from [place] to [place]. - She smoked 2 packs/day from age 48 through 82 and quit. - Mother had breast cancer at age 88 and 50.  Maternal grandmother had breast cancer at age 82.  Father had cancer on cervical spine.  Primary unknown.   PLAN:  Left breast DCIS, high-grade, ER/PR positive: - She reported she missed taking exemestane in January as she was sick.  When she restarted back, she has noticed her blood pressure was high (160/100).  She quit Aromasin around 07/06/2021 and her blood pressure has returned to normal. - We have discussed the importance of getting back on aromatase inhibitor. - We reviewed options including letrozole and tamoxifen.  Out of the 2, letrozole has lower incidence of hypertension.  We have sent a prescription for letrozole 2.5 mg daily. - We will schedule her for bilateral diagnostic mammogram in March.  It was mentioned in her notes from [place] that she should get a mammogram in December 2022, but reason was not stated. - I have recommended follow-up in 3 months to see how she  is tolerating letrozole.  2.  Bone health: - Bone  density test in July 2022 done in [place] showed T score -1.4, osteopenia. - She is taking multivitamin with calcium and vitamin D in it.  3.  Mild hypercalcemia: - Labs on 07/06/2021 show calcium was 10.8.  Albumin 4.1. - She reports taking multivitamin with calcium and D in it. - We will check vitamin D, 1, 25 dihydroxy vitamin D, PTH levels at next visit.     Derek Jack, MD 07/17/21 9:26 AM  [place] [phone]   I, Thana Ates, am acting as a scribe for Dr. Derek Jack.  I, Derek Jack MD, have reviewed the above documentation for accuracy and completeness, and I agree with the above.

## 2021-07-17 NOTE — Patient Instructions (Addendum)
[place] at [facility] Discharge Instructions  You were seen and examined today by Dr. Delton Coombes. Dr. Delton Coombes is a medical oncologist, meaning that he specializes in the treatment of cancer diagnoses. Dr. Delton Coombes discussed your past medical history, family history of cancers, and the events that led to you being here today.  You were referred to Dr. Delton Coombes due to your history of high-grade Ductal Carcinoma in Situ (DCIS). This is a common type of pre-breast cancer. It does place you at higher risk for developing invasive breast cancer. Your DCIS was fed by estrogen, a natural hormone released by your body. The best prevention for developing breast cancer, in both breasts, is to be on a daily anti-estrogen pill for at least 5 years.  Dr. Delton Coombes has recommended starting Letrozole. There is a decreased incidence of hypertension (high BP) with this medication. The most common side effects are hot flashes and joint pains. This should subside within a couple of months. If you experience unbearable hot flashes, please call the clinic at [phone].  We will arrange for you to have a repeat mammogram.  Follow-up as scheduled.   Thank you for choosing [place] at [facility] to provide your oncology and hematology care.  To afford each patient quality time with our provider, please arrive at least 15 minutes before your scheduled appointment time.   If you have a lab appointment with the [facility] please come in thru the Main Entrance and check in at the main information desk.  You need to re-schedule your appointment should you arrive 10 or more minutes late.  We strive to give you quality time with our providers, and arriving late affects you and other patients whose appointments are after yours.  Also, if you no show three or more times for appointments you may be dismissed from the clinic at the providers discretion.     Again,  thank you for choosing [facility]  Our hope is that these requests will decrease the amount of time that you wait before being seen by our physicians.       _____________________________________________________________  Should you have questions after your visit to [facility], please contact our office at [phone] and follow the prompts.  Our office hours are 8:00 a.m. and 4:30 p.m. Monday - Friday.  Please note that voicemails left after 4:00 p.m. may not be returned until the following business day.  We are closed weekends and major holidays.  You do have access to a nurse 24-7, just call the main number to the clinic [phone] and do not press any options, hold on the line and a nurse will answer the phone.    For prescription refill requests, have your pharmacy contact our office and allow 72 hours.    Due to Covid, you will need to wear a mask upon entering the hospital. If you do not have a mask, a mask will be given to you at the Main Entrance upon arrival. For doctor visits, patients may have 1 support person age 66 or older with them. For treatment visits, patients can not have anyone with them due to social distancing guidelines and our immunocompromised population.

## 2021-08-03 ENCOUNTER — Encounter: Payer: Self-pay | Admitting: Family Medicine

## 2021-08-03 ENCOUNTER — Ambulatory Visit (INDEPENDENT_AMBULATORY_CARE_PROVIDER_SITE_OTHER): Payer: Medicare Other | Admitting: Family Medicine

## 2021-08-03 VITALS — BP 138/72 | HR 85 | Temp 96.7°F | Resp 20 | Ht 67.32 in | Wt 271.0 lb

## 2021-08-03 DIAGNOSIS — M2550 Pain in unspecified joint: Secondary | ICD-10-CM | POA: Diagnosis not present

## 2021-08-03 DIAGNOSIS — Z1212 Encounter for screening for malignant neoplasm of rectum: Secondary | ICD-10-CM | POA: Diagnosis not present

## 2021-08-03 DIAGNOSIS — Z1211 Encounter for screening for malignant neoplasm of colon: Secondary | ICD-10-CM

## 2021-08-03 DIAGNOSIS — L918 Other hypertrophic disorders of the skin: Secondary | ICD-10-CM | POA: Diagnosis not present

## 2021-08-03 DIAGNOSIS — R0789 Other chest pain: Secondary | ICD-10-CM | POA: Diagnosis not present

## 2021-08-03 MED ORDER — PREDNISONE 10 MG (21) PO TBPK: ORAL_TABLET | ORAL | 0 refills | Status: DC

## 2021-08-03 NOTE — Progress Notes (Signed)
Subjective:  Patient ID: Amber Diaz, female    DOB: 11/10/1955, 66 y.o.   MRN: [id number]  Patient Care Team: Baruch Gouty, FNP as PCP - General (Family Medicine) Derek Jack, MD as Medical Oncologist (Medical Oncology)   Chief Complaint:  Follow up costochondritis   HPI: Amber Diaz is a 66 y.o. female presenting on 08/03/2021 for Follow up costochondritis   Pt presents today for follow up of left costochondral chest pain. She was treated with a 5 day burst of steroids which was very beneficial. States the pain returned about 5 days ago, not as bad as previous, but there. No new or worsening symptoms. No other associated symptoms.  She reports development of multiple skin tags to chest and groin after completion of radiation therapy. Some areas are irritated and bothersome. She would like to be referred for evaluation and removal.  Has not had colonoscopy, no first degree relatives with CRC. Pt declines colonoscopy referral but agrees to cologuard.       Relevant past medical, surgical, family, and social history reviewed and updated as indicated.  Allergies and medications reviewed and updated. Data reviewed: Chart in Epic.   Past Medical History:  Diagnosis Date   Breast cancer ([address])    HTN (hypertension)    Psoriasis     Past Surgical History:  Procedure Laterality Date   BREAST SURGERY     CESAREAN SECTION     EYE SURGERY     TUBAL LIGATION      Social History   Socioeconomic History   Marital status: Married    Spouse name: Not on file   Number of children: 1   Years of education: Not on file   Highest education level: 12th grade  Occupational History   Not on file  Tobacco Use   Smoking status: Former    Types: Cigarettes   Smokeless tobacco: Never  Vaping Use   Vaping Use: Never used  Substance and Sexual Activity   Alcohol use: Not Currently   Drug use: Never   Sexual activity: Not Currently  Other Topics Concern   Not on file   Social History Narrative   Not on file   Social Determinants of Health   Financial Resource Strain: Not on file  Food Insecurity: Not on file  Transportation Needs: Not on file  Physical Activity: Not on file  Stress: Not on file  Social Connections: Not on file  Intimate Partner Violence: Not on file    Outpatient Encounter Medications as of 08/03/2021  Medication Sig   atorvastatin (LIPITOR) 20 MG tablet Take 1 tablet (20 mg total) by mouth at bedtime.   baclofen (LIORESAL) 10 MG tablet Take 10 mg by mouth 3 (three) times daily as needed.   betamethasone dipropionate 0.05 % cream Apply topically daily as needed.   letrozole (FEMARA) 2.5 MG tablet Take 1 tablet (2.5 mg total) by mouth daily.   montelukast (SINGULAIR) 10 MG tablet SMARTSIG:1 Tablet(s) By Mouth Every Evening   Multiple Vitamins-Iron (MULTIVITAMINS WITH IRON) TABS tablet Take 1 tablet by mouth daily.   olmesartan-hydrochlorothiazide (BENICAR HCT) 40-25 MG tablet Take 1 tablet by mouth every morning.   omeprazole (PRILOSEC) 20 MG capsule Take by mouth.   predniSONE (STERAPRED UNI-PAK 21 TAB) 10 MG (21) TBPK tablet Use as directed on back of pill pack   exemestane (AROMASIN) 25 MG tablet Take 25 mg by mouth daily after breakfast. (Patient not taking: Reported on 08/03/2021)   No  facility-administered encounter medications on file as of 08/03/2021.    Not on File  Review of Systems  Constitutional:  Negative for activity change, appetite change, chills, diaphoresis, fatigue and fever.  HENT: Negative.    Eyes: Negative.  Negative for photophobia and visual disturbance.  Respiratory:  Negative for cough, chest tightness and shortness of breath.   Cardiovascular:  Negative for chest pain, palpitations and leg swelling.  Gastrointestinal:  Negative for abdominal pain, blood in stool, constipation, diarrhea, nausea and vomiting.  Endocrine: Negative.   Genitourinary:  Negative for decreased urine volume, difficulty  urinating, dysuria, frequency and urgency.  Musculoskeletal:  Positive for arthralgias and myalgias (left chest, with movement and deep breathing). Negative for back pain, gait problem, joint swelling, neck pain and neck stiffness.  Skin:        lesions  Allergic/Immunologic: Negative.   Neurological:  Negative for dizziness, tremors, seizures, syncope, facial asymmetry, speech difficulty, weakness, light-headedness, numbness and headaches.  Hematological: Negative.   Psychiatric/Behavioral:  Negative for confusion, hallucinations, sleep disturbance and suicidal ideas.   All other systems reviewed and are negative.      Objective:  BP 138/72    Pulse 85    Temp (!) 96.7 F (35.9 C) (Oral)    Resp 20    Ht 5' 7.32" (1.71 m)    Wt 271 lb (122.9 kg)    SpO2 95%    BMI 42.04 kg/m    Wt Readings from Last 3 Encounters:  08/03/21 271 lb (122.9 kg)  07/17/21 266 lb 8.6 oz (120.9 kg)  07/06/21 267 lb (121.1 kg)    Physical Exam Vitals and nursing note reviewed.  Constitutional:      General: She is not in acute distress.    Appearance: Normal appearance. She is well-developed and well-groomed. She is obese. She is not ill-appearing, toxic-appearing or diaphoretic.  HENT:     Head: Normocephalic and atraumatic.     Jaw: There is normal jaw occlusion.     Right Ear: Hearing normal.     Left Ear: Hearing normal.     Nose: Nose normal.     Mouth/Throat:     Lips: Pink.     Mouth: Mucous membranes are moist.     Pharynx: Oropharynx is clear. Uvula midline.  Eyes:     General: Lids are normal.     Extraocular Movements: Extraocular movements intact.     Conjunctiva/sclera: Conjunctivae normal.     Pupils: Pupils are equal, round, and reactive to light.  Neck:     Thyroid: No thyroid mass, thyromegaly or thyroid tenderness.     Vascular: No carotid bruit or JVD.     Trachea: Trachea and phonation normal.  Cardiovascular:     Rate and Rhythm: Normal rate and regular rhythm.     Chest  Wall: PMI is not displaced.     Pulses: Normal pulses.     Heart sounds: Normal heart sounds. No murmur heard.   No friction rub. No gallop.  Pulmonary:     Effort: Pulmonary effort is normal. No respiratory distress.     Breath sounds: Normal breath sounds. No wheezing.  Abdominal:     General: There is no abdominal bruit.     Palpations: There is no hepatomegaly or splenomegaly.  Musculoskeletal:        General: Swelling (bilateral hands, minimal) and tenderness present. No deformity or signs of injury.     Cervical back: Normal range of motion and neck supple.  Right lower leg: No edema.     Left lower leg: No edema.  Lymphadenopathy:     Cervical: No cervical adenopathy.  Skin:    General: Skin is warm and dry.     Capillary Refill: Capillary refill takes less than 2 seconds.     Coloration: Skin is not cyanotic, jaundiced or pale.     Findings: Lesion present. No rash.     Comments: Multiple skin colored lesions to chest, neck, axilla, and groin, narrow stalk.   Neurological:     General: No focal deficit present.     Mental Status: She is alert and oriented to person, place, and time.     Sensory: Sensation is intact.     Motor: Motor function is intact.     Coordination: Coordination is intact.     Gait: Gait is intact.     Deep Tendon Reflexes: Reflexes are normal and symmetric.  Psychiatric:        Attention and Perception: Attention and perception normal.        Mood and Affect: Mood and affect normal.        Speech: Speech normal.        Behavior: Behavior normal. Behavior is cooperative.        Thought Content: Thought content normal.        Cognition and Memory: Cognition and memory normal.        Judgment: Judgment normal.    Results for orders placed or performed in visit on 07/06/21  CBC with Differential/Platelet  Result Value Ref Range   WBC 7.6 3.4 - 10.8 x10E3/uL   RBC 4.77 3.77 - 5.28 x10E6/uL   Hemoglobin 13.7 11.1 - 15.9 g/dL   Hematocrit 41.3  34.0 - 46.6 %   MCV 87 79 - 97 fL   MCH 28.7 26.6 - 33.0 pg   MCHC 33.2 31.5 - 35.7 g/dL   RDW 13.1 11.7 - 15.4 %   Platelets 282 150 - 450 x10E3/uL   Neutrophils 61 Not Estab. %   Lymphs 27 Not Estab. %   Monocytes 8 Not Estab. %   Eos 3 Not Estab. %   Basos 1 Not Estab. %   Neutrophils Absolute 4.7 1.4 - 7.0 x10E3/uL   Lymphocytes Absolute 2.0 0.7 - 3.1 x10E3/uL   Monocytes Absolute 0.6 0.1 - 0.9 x10E3/uL   EOS (ABSOLUTE) 0.2 0.0 - 0.4 x10E3/uL   Basophils Absolute 0.1 0.0 - 0.2 x10E3/uL   Immature Granulocytes 0 Not Estab. %   Immature Grans (Abs) 0.0 0.0 - 0.1 x10E3/uL  CMP14+EGFR  Result Value Ref Range   Glucose 85 70 - 99 mg/dL   BUN 17 8 - 27 mg/dL   Creatinine, Ser 0.88 0.57 - 1.00 mg/dL   eGFR 73 >59 mL/min/1.73   BUN/Creatinine Ratio 19 12 - 28   Sodium 138 134 - 144 mmol/L   Potassium 4.2 3.5 - 5.2 mmol/L   Chloride 102 96 - 106 mmol/L   CO2 23 20 - 29 mmol/L   Calcium 10.8 (H) 8.7 - 10.3 mg/dL   Total Protein 7.1 6.0 - 8.5 g/dL   Albumin 4.1 3.8 - 4.8 g/dL   Globulin, Total 3.0 1.5 - 4.5 g/dL   Albumin/Globulin Ratio 1.4 1.2 - 2.2   Bilirubin Total 0.3 0.0 - 1.2 mg/dL   Alkaline Phosphatase 98 44 - 121 IU/L   AST 21 0 - 40 IU/L   ALT 23 0 - 32 IU/L  Lipid panel  Result Value Ref Range   Cholesterol, Total 227 (H) 100 - 199 mg/dL   Triglycerides 244 (H) 0 - 149 mg/dL   HDL 37 (L) >39 mg/dL   VLDL Cholesterol Cal 45 (H) 5 - 40 mg/dL   LDL Chol Calc (NIH) 145 (H) 0 - 99 mg/dL   Chol/HDL Ratio 6.1 (H) 0.0 - 4.4 ratio  Thyroid Panel With TSH  Result Value Ref Range   TSH 1.230 0.450 - 4.500 uIU/mL   T4, Total 5.7 4.5 - 12.0 ug/dL   T3 Uptake Ratio 25 24 - 39 %   Free Thyroxine Index 1.4 1.2 - 4.9       Pertinent labs & imaging results that were available during my care of the patient were reviewed by me and considered in my medical decision making.  Assessment & Plan:  Amber Diaz was seen today for follow up costochondritis.  Diagnoses and all orders for  this visit:  Costochondral chest pain Great improvement while on steroid burst. Symptoms recently returned. Not new or worsening. No ACS symptoms. Could be neuropathic from lumpectomy and radiation therapy. Will treat with steroid taper to see if symptoms resolve. If not, may need imaging.  -     predniSONE (STERAPRED UNI-PAK 21 TAB) 10 MG (21) TBPK tablet; Use as directed on back of pill pack  Multiple joint pain Ongoing pain of back, shoulders, hands, and wrists. No prior testing for RA or other autoimmune causes. Will obtain ANA today.  -     ANA,IFA RA Diag Pnl w/rflx Tit/Patn  Multiple acquired skin tags Onset after radiation therapy. Will refer to dermatology due to number of lesions.  -     Ambulatory referral to Dermatology  Screening for colorectal cancer -     Cologuard     Continue all other maintenance medications.  Follow up plan: Return in about 3 months (around 11/03/2021), or if symptoms worsen or fail to improve, for chronic follow up .   Continue healthy lifestyle choices, including diet (rich in fruits, vegetables, and lean proteins, and low in salt and simple carbohydrates) and exercise (at least 30 minutes of moderate physical activity daily).   The above assessment and management plan was discussed with the patient. The patient verbalized understanding of and has agreed to the management plan. Patient is aware to call the clinic if they develop any new symptoms or if symptoms persist or worsen. Patient is aware when to return to the clinic for a follow-up visit. Patient educated on when it is appropriate to go to the emergency department.   Monia Pouch, FNP-C [facility] [phone]

## 2021-08-08 ENCOUNTER — Inpatient Hospital Stay
Admission: RE | Admit: 2021-08-08 | Discharge: 2021-08-08 | Disposition: A | Discharge status: Home / Self Care | Payer: Self-pay | Source: Ambulatory Visit | Attending: Hematology | Admitting: Hematology

## 2021-08-08 ENCOUNTER — Ambulatory Visit
Admission: RE | Admit: 2021-08-08 | Discharge: 2021-08-08 | Disposition: A | Discharge status: Home / Self Care | Payer: Self-pay | Source: Ambulatory Visit | Attending: Hematology | Admitting: Hematology

## 2021-08-08 ENCOUNTER — Other Ambulatory Visit (HOSPITAL_COMMUNITY): Payer: Self-pay | Admitting: Hematology

## 2021-08-08 DIAGNOSIS — D0512 Intraductal carcinoma in situ of left breast: Secondary | ICD-10-CM

## 2021-08-09 LAB — ANA,IFA RA DIAG PNL W/RFLX TIT/PATN
ANA Titer 1: NEGATIVE
Cyclic Citrullin Peptide Ab: <1 units (ref 0–19)
Rheumatoid fact SerPl-aCnc: <10 IU/mL (ref <14.0)

## 2021-08-21 ENCOUNTER — Ambulatory Visit (HOSPITAL_COMMUNITY)
Admission: RE | Admit: 2021-08-21 | Discharge: 2021-08-21 | Disposition: A | Discharge status: Home / Self Care | Payer: Medicare Other | Source: Ambulatory Visit | Attending: Hematology | Admitting: Hematology

## 2021-08-21 ENCOUNTER — Ambulatory Visit (HOSPITAL_COMMUNITY)
Admission: RE | Admit: 2021-08-21 | Discharge: 2021-08-21 | Disposition: A | Discharge status: Home / Self Care | Payer: Medicare Other | Source: Ambulatory Visit | Attending: Family Medicine | Admitting: Family Medicine

## 2021-08-21 ENCOUNTER — Encounter (HOSPITAL_COMMUNITY): Payer: Self-pay

## 2021-08-21 ENCOUNTER — Other Ambulatory Visit: Payer: Self-pay

## 2021-08-21 DIAGNOSIS — D0512 Intraductal carcinoma in situ of left breast: Secondary | ICD-10-CM | POA: Diagnosis not present

## 2021-08-21 DIAGNOSIS — R928 Other abnormal and inconclusive findings on diagnostic imaging of breast: Secondary | ICD-10-CM | POA: Diagnosis not present

## 2021-08-29 DIAGNOSIS — D485 Neoplasm of uncertain behavior of skin: Secondary | ICD-10-CM | POA: Diagnosis not present

## 2021-08-29 DIAGNOSIS — D225 Melanocytic nevi of trunk: Secondary | ICD-10-CM | POA: Diagnosis not present

## 2021-08-29 DIAGNOSIS — D2272 Melanocytic nevi of left lower limb, including hip: Secondary | ICD-10-CM | POA: Diagnosis not present

## 2021-08-29 DIAGNOSIS — Z1283 Encounter for screening for malignant neoplasm of skin: Secondary | ICD-10-CM | POA: Diagnosis not present

## 2021-09-10 DIAGNOSIS — Z1211 Encounter for screening for malignant neoplasm of colon: Secondary | ICD-10-CM | POA: Diagnosis not present

## 2021-09-10 DIAGNOSIS — Z1212 Encounter for screening for malignant neoplasm of rectum: Secondary | ICD-10-CM | POA: Diagnosis not present

## 2021-09-17 DIAGNOSIS — L988 Other specified disorders of the skin and subcutaneous tissue: Secondary | ICD-10-CM | POA: Diagnosis not present

## 2021-09-17 DIAGNOSIS — D485 Neoplasm of uncertain behavior of skin: Secondary | ICD-10-CM | POA: Diagnosis not present

## 2021-09-19 LAB — COLOGUARD: COLOGUARD: NEGATIVE

## 2021-10-09 ENCOUNTER — Inpatient Hospital Stay (HOSPITAL_COMMUNITY): Payer: Medicare Other | Attending: Hematology

## 2021-10-09 DIAGNOSIS — Z79811 Long term (current) use of aromatase inhibitors: Secondary | ICD-10-CM | POA: Insufficient documentation

## 2021-10-09 DIAGNOSIS — D0512 Intraductal carcinoma in situ of left breast: Secondary | ICD-10-CM | POA: Insufficient documentation

## 2021-10-09 DIAGNOSIS — Z170 Estrogen receptor positive status [ER+]: Secondary | ICD-10-CM | POA: Insufficient documentation

## 2021-10-09 DIAGNOSIS — Z923 Personal history of irradiation: Secondary | ICD-10-CM | POA: Diagnosis not present

## 2021-10-09 LAB — CBC WITH DIFFERENTIAL/PLATELET
Abs Immature Granulocytes: 0.01 10*3/uL (ref 0.00–0.07)
Basophils Absolute: 0.1 10*3/uL (ref 0.0–0.1)
Basophils Relative: 1 %
Eosinophils Absolute: 0.3 10*3/uL (ref 0.0–0.5)
Eosinophils Relative: 4 %
HCT: 41.7 % (ref 36.0–46.0)
Hemoglobin: 13.2 g/dL (ref 12.0–15.0)
Immature Granulocytes: 0 %
Lymphocytes Relative: 25 %
Lymphs Abs: 2 10*3/uL (ref 0.7–4.0)
MCH: 28.1 pg (ref 26.0–34.0)
MCHC: 31.7 g/dL (ref 30.0–36.0)
MCV: 88.9 fL (ref 80.0–100.0)
Monocytes Absolute: 0.6 10*3/uL (ref 0.1–1.0)
Monocytes Relative: 8 %
Neutro Abs: 5 10*3/uL (ref 1.7–7.7)
Neutrophils Relative %: 62 %
Platelets: 268 10*3/uL (ref 150–400)
RBC: 4.69 MIL/uL (ref 3.87–5.11)
RDW: 14 % (ref 11.5–15.5)
WBC: 7.9 10*3/uL (ref 4.0–10.5)
nRBC: 0 % (ref 0.0–0.2)

## 2021-10-09 LAB — COMPREHENSIVE METABOLIC PANEL
ALT: 28 U/L (ref 0–44)
AST: 22 U/L (ref 15–41)
Albumin: 4.2 g/dL (ref 3.5–5.0)
Alkaline Phosphatase: 99 U/L (ref 38–126)
Anion gap: 5 (ref 5–15)
BUN: 26 mg/dL — ABNORMAL HIGH (ref 8–23)
CO2: 26 mmol/L (ref 22–32)
Calcium: 11.5 mg/dL — ABNORMAL HIGH (ref 8.9–10.3)
Chloride: 109 mmol/L (ref 98–111)
Creatinine, Ser: 0.98 mg/dL (ref 0.44–1.00)
GFR, Estimated: >60 mL/min (ref >60)
Glucose, Bld: 106 mg/dL — ABNORMAL HIGH (ref 70–99)
Potassium: 4.1 mmol/L (ref 3.5–5.1)
Sodium: 140 mmol/L (ref 135–145)
Total Bilirubin: 0.8 mg/dL (ref 0.3–1.2)
Total Protein: 7.8 g/dL (ref 6.5–8.1)

## 2021-10-09 LAB — VITAMIN D 25 HYDROXY (VIT D DEFICIENCY, FRACTURES): Vit D, 25-Hydroxy: 27.73 ng/mL — ABNORMAL LOW (ref 30–100)

## 2021-10-10 LAB — PTH, INTACT AND CALCIUM
Calcium, Total (PTH): 11.7 mg/dL — ABNORMAL HIGH (ref 8.7–10.3)
PTH: 46 pg/mL (ref 15–65)

## 2021-10-13 DIAGNOSIS — H2513 Age-related nuclear cataract, bilateral: Secondary | ICD-10-CM | POA: Diagnosis not present

## 2021-10-13 DIAGNOSIS — H40033 Anatomical narrow angle, bilateral: Secondary | ICD-10-CM | POA: Diagnosis not present

## 2021-10-16 ENCOUNTER — Inpatient Hospital Stay (HOSPITAL_COMMUNITY): Payer: Medicare Other

## 2021-10-16 ENCOUNTER — Inpatient Hospital Stay (HOSPITAL_BASED_OUTPATIENT_CLINIC_OR_DEPARTMENT_OTHER): Payer: Medicare Other | Admitting: Hematology

## 2021-10-16 VITALS — BP 112/52 | HR 79 | Temp 98.6°F | Resp 18 | Ht 68.0 in | Wt 267.5 lb

## 2021-10-16 DIAGNOSIS — Z923 Personal history of irradiation: Secondary | ICD-10-CM | POA: Diagnosis not present

## 2021-10-16 DIAGNOSIS — Z79811 Long term (current) use of aromatase inhibitors: Secondary | ICD-10-CM | POA: Diagnosis not present

## 2021-10-16 DIAGNOSIS — Z170 Estrogen receptor positive status [ER+]: Secondary | ICD-10-CM | POA: Diagnosis not present

## 2021-10-16 DIAGNOSIS — D0512 Intraductal carcinoma in situ of left breast: Secondary | ICD-10-CM

## 2021-10-16 NOTE — Patient Instructions (Addendum)
[place] at [facility] Discharge Instructions  You were seen and examined today by Dr. Delton Coombes.  Dr. Delton Coombes discussed your most recent lab work and your Vitamin D is low. Dr. Delton Coombes also checked a Vitamin D 25, 1 dihydroxy but it is not back yet. Your Calcium is high 11.5 today.   Follow-up as scheduled.    Thank you for choosing [place] at [facility] to provide your oncology and hematology care.  To afford each patient quality time with our provider, please arrive at least 15 minutes before your scheduled appointment time.   If you have a lab appointment with the [facility] please come in thru the Main Entrance and check in at the main information desk.  You need to re-schedule your appointment should you arrive 10 or more minutes late.  We strive to give you quality time with our providers, and arriving late affects you and other patients whose appointments are after yours.  Also, if you no show three or more times for appointments you may be dismissed from the clinic at the providers discretion.     Again, thank you for choosing [facility].  Our hope is that these requests will decrease the amount of time that you wait before being seen by our physicians.       _____________________________________________________________  Should you have questions after your visit to [facility], please contact our office at [phone] and follow the prompts.  Our office hours are 8:00 a.m. and 4:30 p.m. Monday - Friday.  Please note that voicemails left after 4:00 p.m. may not be returned until the following business day.  We are closed weekends and major holidays.  You do have access to a nurse 24-7, just call the main number to the clinic [phone] and do not press any options, hold on the line and a nurse will answer the phone.    For prescription refill requests, have your pharmacy contact our office and  allow 72 hours.    Due to Covid, you will need to wear a mask upon entering the hospital. If you do not have a mask, a mask will be given to you at the Main Entrance upon arrival. For doctor visits, patients may have 1 support person age 64 or older with them. For treatment visits, patients can not have anyone with them due to social distancing guidelines and our immunocompromised population.

## 2021-10-16 NOTE — Progress Notes (Signed)
[facility] [address], [place] [address]   Patient Care Team: Rakes, Connye Burkitt, [place] as PCP - General (Family Medicine) Derek Jack, MD as Medical Oncologist (Medical Oncology)  SUMMARY OF ONCOLOGIC HISTORY: Oncology History  Ductal carcinoma in situ (DCIS) of left breast  07/06/2021 Initial Diagnosis   Ductal carcinoma in situ (DCIS) of left breast    07/17/2021 Cancer Staging   Staging form: Breast, AJCC 8th Edition - Clinical stage from 07/17/2021: Stage 0 (cTis (DCIS), cN0, cM0, G3, ER+, PR+, HER2: Not Assessed) - Signed by Derek Jack, MD on 07/17/2021 Stage prefix: Initial diagnosis Nuclear grade: G3 Histologic grading system: 3 grade system      CHIEF COMPLIANT: Follow-up of left breast DCIS   INTERVAL HISTORY: Amber Diaz is a 66 y.o. female here today for follow up of her left breast DCIS. Her last visit was on 07/17/2021.   Today she reports feeling well. She reports hot flashes at night. She denies new aches or pains since starting letrozole. She is not taking vitamin D or calcium. She denies drinking milk or taking TUMS. She denies family history of kidney stones and hypercalcemia. She reports a lump in the area of the right breast scar tissue appearing a couple of days ago.   REVIEW OF SYSTEMS:   Review of Systems  Constitutional:  Positive for fatigue. Negative for appetite change.  Endocrine: Positive for hot flashes.  Musculoskeletal:  Positive for arthralgias (7/10).  Neurological:  Positive for dizziness.  All other systems reviewed and are negative.  I have reviewed the past medical history, past surgical history, social history and family history with the patient and they are unchanged from previous note.   ALLERGIES:   has no allergies on file.   MEDICATIONS:  Current Outpatient Medications  Medication Sig Dispense Refill   atorvastatin (LIPITOR) 20 MG tablet Take 1 tablet (20 mg total) by mouth at  bedtime. 90 tablet 3   baclofen (LIORESAL) 10 MG tablet Take 10 mg by mouth 3 (three) times daily as needed.     betamethasone dipropionate 0.05 % cream Apply topically daily as needed.     exemestane (AROMASIN) 25 MG tablet Take 25 mg by mouth daily after breakfast. (Patient not taking: Reported on 08/03/2021)     letrozole (FEMARA) 2.5 MG tablet Take 1 tablet (2.5 mg total) by mouth daily. 90 tablet 3   montelukast (SINGULAIR) 10 MG tablet SMARTSIG:1 Tablet(s) By Mouth Every Evening     Multiple Vitamins-Iron (MULTIVITAMINS WITH IRON) TABS tablet Take 1 tablet by mouth daily.     olmesartan-hydrochlorothiazide (BENICAR HCT) 40-25 MG tablet Take 1 tablet by mouth every morning.     omeprazole (PRILOSEC) 20 MG capsule Take by mouth.     predniSONE (STERAPRED UNI-PAK 21 TAB) 10 MG (21) TBPK tablet Use as directed on back of pill pack 21 tablet 0   No current facility-administered medications for this visit.     PHYSICAL EXAMINATION: Performance status (ECOG): 1 - Symptomatic but completely ambulatory  There were no vitals filed for this visit. Wt Readings from Last 3 Encounters:  08/03/21 271 lb (122.9 kg)  07/17/21 266 lb 8.6 oz (120.9 kg)  07/06/21 267 lb (121.1 kg)   Physical Exam Vitals reviewed.  Constitutional:      Appearance: Normal appearance. She is obese.  Cardiovascular:     Rate and Rhythm: Normal rate and regular rhythm.     Pulses: Normal pulses.  Heart sounds: Normal heart sounds.  Pulmonary:     Effort: Pulmonary effort is normal.     Breath sounds: Normal breath sounds.  Chest:  Breasts:    Right: No mass or skin change (lumpectomy scar 9:00 WNL).  Neurological:     General: No focal deficit present.     Mental Status: She is alert and oriented to person, place, and time.  Psychiatric:        Mood and Affect: Mood normal.        Behavior: Behavior normal.    Breast Exam Chaperone: Thana Ates     LABORATORY DATA:  I have reviewed the data as  listed    Latest Ref Rng & Units 10/09/2021   10:51 AM 07/06/2021   11:36 AM  CMP  Glucose 70 - 99 mg/dL 106   85    BUN 8 - 23 mg/dL 26   17    Creatinine 0.44 - 1.00 mg/dL 0.98   0.88    Sodium 135 - 145 mmol/L 140   138    Potassium 3.5 - 5.1 mmol/L 4.1   4.2    Chloride 98 - 111 mmol/L 109   102    CO2 22 - 32 mmol/L 26   23    Calcium 8.9 - 10.3 mg/dL 11.5     11.7   10.8    Total Protein 6.5 - 8.1 g/dL 7.8   7.1    Total Bilirubin 0.3 - 1.2 mg/dL 0.8   0.3    Alkaline Phos 38 - 126 U/L 99   98    AST 15 - 41 U/L 22   21    ALT 0 - 44 U/L 28   23     No results found for: KDT267 Lab Results  Component Value Date   WBC 7.9 10/09/2021   HGB 13.2 10/09/2021   HCT 41.7 10/09/2021   MCV 88.9 10/09/2021   PLT 268 10/09/2021   NEUTROABS 5.0 10/09/2021    ASSESSMENT:  Left breast high-grade DCIS, ER positive: - Screening mammogram on 08/12/2020 with asymmetry in the outer left breast in [place], [place]. - Diagnostic mammogram/ultrasound and US biopsy on 09/05/2020, pathology consistent with high-grade DCIS, ER/PR positive. - Lumpectomy and SLN biopsy on 10/03/2020 - Pathology with 1.4 cm DCIS, margins negative, 4/4 lymph nodes negative. - Status post XRT to the left breast - She was reportedly negative for genetic testing.  She had previous right breast biopsies x4 (1996, 2006, 2021). - She was started on anastrozole, developed nausea, vomiting and abdominal pain - Exemestane started on 01/10/2021, discontinued in the first week of February 2023 secondary to hypertension. - Letrozole started on 07/17/2021. - She has some hot flashes which started after taking anastrozole but never went away.  They are tolerable. - She also has joint pains in the ankles, knees, hips, shoulders and wrists which are chronic and stable.    Social/family history: - She is retired Glass blower/designer.  Recently moved from [place] to [place]. - She smoked 2 packs/day from age 36  through 66 and quit. - Mother had breast cancer at age 28 and 92.  Maternal grandmother had breast cancer at age 13.  Father had cancer on cervical spine.  Primary unknown.   PLAN:  Left breast DCIS, high-grade, ER/PR positive: -She reports that she is tolerating letrozole reasonably well.  She has hot flashes mainly at nighttime.  No arthralgias reported. - Reviewed labs from 10/09/2021  which was grossly normal. - Reviewed mammogram from 08/21/2021, BI-RADS Category 2. - She had concerns about scar in the right breast.  I have examined right breast lumpectomy scar which was within normal limits with no suspicious lesions.  2.  Osteopenia: -DEXA scan in July 2022 done in [place] showed T score -1.4, osteopenia. - She is not taking any calcium or vitamin D supplements.  3.  Mild hypercalcemia: -Labs on 10/09/2021 with calcium 11.7 and PTH 46. - She is not taking multivitamins.-She is not taking any calcium or vitamin D.  No Tums reported. - She is on HCTZ combination pill for hypertension for couple of years. - Vitamin D level is 27.7.  125 hydroxy vitamin D is pending. - She does not have a history of renal stones.  No family history of hypercalcemia or stones. - Recommend checking PTH RP, SPEP, immunofixation, free light chains, LDH, ACE level.  Also recommend 24-hour urine for calcium. - RTC 2 to 3 weeks for follow-up.  Breast Cancer therapy associated bone loss: I have recommended calcium, Vitamin D and weight bearing exercises.  Orders placed this encounter:  No orders of the defined types were placed in this encounter.   The patient has a good understanding of the overall plan. She agrees with it. She will call with any problems that may develop before the next visit here.  Derek Jack, MD [facility] [phone]   I, Thana Ates, am acting as a scribe for Dr. Derek Jack.  I, Derek Jack MD, have reviewed the above documentation  for accuracy and completeness, and I agree with the above.

## 2021-10-17 LAB — KAPPA/LAMBDA LIGHT CHAINS
Kappa free light chain: 30.1 mg/L — ABNORMAL HIGH (ref 3.3–19.4)
Kappa, lambda light chain ratio: 1.59 (ref 0.26–1.65)
Lambda free light chains: 18.9 mg/L (ref 5.7–26.3)

## 2021-10-17 LAB — ANGIOTENSIN CONVERTING ENZYME: Angiotensin-Converting Enzyme: 42 U/L (ref 14–82)

## 2021-10-18 LAB — PROTEIN ELECTROPHORESIS, SERUM
A/G Ratio: 1.1 (ref 0.7–1.7)
Albumin ELP: 3.6 g/dL (ref 2.9–4.4)
Alpha-1-Globulin: 0.2 g/dL (ref 0.0–0.4)
Alpha-2-Globulin: 0.8 g/dL (ref 0.4–1.0)
Beta Globulin: 1.1 g/dL (ref 0.7–1.3)
Gamma Globulin: 1.1 g/dL (ref 0.4–1.8)
Globulin, Total: 3.2 g/dL (ref 2.2–3.9)
Total Protein ELP: 6.8 g/dL (ref 6.0–8.5)

## 2021-10-19 ENCOUNTER — Other Ambulatory Visit (HOSPITAL_COMMUNITY): Payer: Self-pay

## 2021-10-19 DIAGNOSIS — D0512 Intraductal carcinoma in situ of left breast: Secondary | ICD-10-CM | POA: Diagnosis not present

## 2021-10-19 DIAGNOSIS — Z923 Personal history of irradiation: Secondary | ICD-10-CM | POA: Diagnosis not present

## 2021-10-19 DIAGNOSIS — Z79811 Long term (current) use of aromatase inhibitors: Secondary | ICD-10-CM | POA: Diagnosis not present

## 2021-10-19 DIAGNOSIS — Z170 Estrogen receptor positive status [ER+]: Secondary | ICD-10-CM | POA: Diagnosis not present

## 2021-10-19 LAB — IMMUNOFIXATION ELECTROPHORESIS
IgA: 310 mg/dL (ref 87–352)
IgG (Immunoglobin G), Serum: 1172 mg/dL (ref 586–1602)
IgM (Immunoglobulin M), Srm: 38 mg/dL (ref 26–217)
Total Protein ELP: 7.2 g/dL (ref 6.0–8.5)

## 2021-10-22 LAB — CALCIUM, URINE, 24 HOUR
Calcium, 24 hour urine: 189 mg/24 hr (ref 0–320)
Calcium, Ur: 11.1 mg/dL
Total Volume: 1700

## 2021-10-23 ENCOUNTER — Other Ambulatory Visit (HOSPITAL_COMMUNITY): Payer: Self-pay

## 2021-10-23 DIAGNOSIS — D0512 Intraductal carcinoma in situ of left breast: Secondary | ICD-10-CM

## 2021-10-23 NOTE — Progress Notes (Signed)
[facility] rejected PTH related peptide specimen, patient scheduled for redraw and new ordered placed per Dr. Delton Coombes

## 2021-10-24 LAB — VITAMIN D 1,25 DIHYDROXY
Vitamin D 1, 25 (OH)2 Total: 53 pg/mL
Vitamin D2 1, 25 (OH)2: <10 pg/mL
Vitamin D3 1, 25 (OH)2: 48 pg/mL

## 2021-10-26 ENCOUNTER — Inpatient Hospital Stay (HOSPITAL_COMMUNITY): Payer: Medicare Other | Attending: Hematology

## 2021-10-26 DIAGNOSIS — Z79811 Long term (current) use of aromatase inhibitors: Secondary | ICD-10-CM | POA: Insufficient documentation

## 2021-10-26 DIAGNOSIS — Z923 Personal history of irradiation: Secondary | ICD-10-CM | POA: Insufficient documentation

## 2021-10-26 DIAGNOSIS — D0512 Intraductal carcinoma in situ of left breast: Secondary | ICD-10-CM | POA: Insufficient documentation

## 2021-10-26 DIAGNOSIS — Z170 Estrogen receptor positive status [ER+]: Secondary | ICD-10-CM | POA: Diagnosis not present

## 2021-10-31 LAB — PTH-RELATED PEPTIDE: PTH-related peptide: <2 pmol/L

## 2021-11-02 DIAGNOSIS — H26492 Other secondary cataract, left eye: Secondary | ICD-10-CM | POA: Diagnosis not present

## 2021-11-06 ENCOUNTER — Encounter: Payer: Self-pay | Admitting: Family Medicine

## 2021-11-06 ENCOUNTER — Ambulatory Visit (INDEPENDENT_AMBULATORY_CARE_PROVIDER_SITE_OTHER): Payer: Medicare Other | Admitting: Family Medicine

## 2021-11-06 ENCOUNTER — Ambulatory Visit (INDEPENDENT_AMBULATORY_CARE_PROVIDER_SITE_OTHER): Payer: Medicare Other

## 2021-11-06 VITALS — BP 122/68 | HR 75 | Temp 98.9°F | Ht 68.0 in | Wt 270.0 lb

## 2021-11-06 DIAGNOSIS — M542 Cervicalgia: Secondary | ICD-10-CM | POA: Diagnosis not present

## 2021-11-06 DIAGNOSIS — M2550 Pain in unspecified joint: Secondary | ICD-10-CM | POA: Diagnosis not present

## 2021-11-06 DIAGNOSIS — M5030 Other cervical disc degeneration, unspecified cervical region: Secondary | ICD-10-CM | POA: Diagnosis not present

## 2021-11-06 NOTE — Progress Notes (Signed)
Subjective:  Patient ID: Amber Diaz, female    DOB: Mar 13, 1956, 66 y.o.   MRN: [id number]  Patient Care Team: Baruch Gouty, FNP as PCP - General (Family Medicine) Derek Jack, MD as Medical Oncologist (Medical Oncology)   Chief Complaint:  3 month follow up (Costochondrial pain plus hip pain)   HPI: Amber Diaz is a 66 y.o. female presenting on 11/06/2021 for 3 month follow up (Costochondrial pain plus hip pain)   Pt presents today for ongoing arthralgias, worse since starting statin therapy and letrozole. She states noting over the counter has been helping with the symptoms. States her neck has really been bothering her over the last several weeks. She denies injury or falls. She denies weakness in extremities. No fever, chills, unexlained weight loss. She is followed closely by oncology. Her calcium levels have been slightly elevated, has had extensive labs with oncology which have been reviewed today. She does not take any supplements of antiacids on a regular basis.     Relevant past medical, surgical, family, and social history reviewed and updated as indicated.  Allergies and medications reviewed and updated. Data reviewed: Chart in Epic.   Past Medical History:  Diagnosis Date   Breast cancer ([facility])    HTN (hypertension)    Psoriasis     Past Surgical History:  Procedure Laterality Date   BREAST SURGERY     CESAREAN SECTION     EYE SURGERY     TUBAL LIGATION      Social History   Socioeconomic History   Marital status: Married    Spouse name: Not on file   Number of children: 1   Years of education: Not on file   Highest education level: 12th grade  Occupational History   Not on file  Tobacco Use   Smoking status: Former    Types: Cigarettes   Smokeless tobacco: Never  Vaping Use   Vaping Use: Never used  Substance and Sexual Activity   Alcohol use: Not Currently   Drug use: Never   Sexual activity: Not Currently  Other Topics Concern    Not on file  Social History Narrative   Not on file   Social Determinants of Health   Financial Resource Strain: Not on file  Food Insecurity: Not on file  Transportation Needs: Not on file  Physical Activity: Not on file  Stress: Not on file  Social Connections: Not on file  Intimate Partner Violence: Not on file    Outpatient Encounter Medications as of 11/06/2021  Medication Sig   atorvastatin (LIPITOR) 20 MG tablet Take 1 tablet (20 mg total) by mouth at bedtime.   baclofen (LIORESAL) 10 MG tablet Take 10 mg by mouth 3 (three) times daily as needed.   betamethasone dipropionate 0.05 % cream Apply topically daily as needed.   letrozole (FEMARA) 2.5 MG tablet Take 1 tablet (2.5 mg total) by mouth daily.   montelukast (SINGULAIR) 10 MG tablet SMARTSIG:1 Tablet(s) By Mouth Every Evening   olmesartan-hydrochlorothiazide (BENICAR HCT) 40-25 MG tablet Take 1 tablet by mouth every morning.   omeprazole (PRILOSEC) 20 MG capsule Take by mouth.   No facility-administered encounter medications on file as of 11/06/2021.    No Known Allergies  Review of Systems  Constitutional:  Positive for activity change and fatigue. Negative for appetite change, chills, diaphoresis, fever and unexpected weight change.  Respiratory:  Negative for cough and shortness of breath.   Cardiovascular:  Negative for chest pain, palpitations  and leg swelling.  Gastrointestinal:  Negative for abdominal pain, diarrhea, nausea and vomiting.  Genitourinary:  Negative for decreased urine volume and difficulty urinating.  Musculoskeletal:  Positive for arthralgias, back pain and neck pain. Negative for gait problem, joint swelling, myalgias and neck stiffness.  Skin:  Negative for color change.  Neurological:  Negative for dizziness, tremors, seizures, syncope, speech difficulty, weakness, light-headedness, numbness and headaches.  Psychiatric/Behavioral:  Negative for confusion.   All other systems reviewed and are  negative.       Objective:  BP 122/68   Pulse 75   Temp 98.9 F (37.2 C)   Ht 5' 8"  (1.727 m)   Wt 270 lb (122.5 kg)   SpO2 95%   BMI 41.05 kg/m    Wt Readings from Last 3 Encounters:  11/06/21 270 lb (122.5 kg)  10/16/21 267 lb 8 oz (121.3 kg)  08/03/21 271 lb (122.9 kg)    Physical Exam Vitals and nursing note reviewed.  Constitutional:      Appearance: Normal appearance. She is obese.  HENT:     Head: Normocephalic and atraumatic.     Mouth/Throat:     Mouth: Mucous membranes are moist.  Eyes:     Pupils: Pupils are equal, round, and reactive to light.  Neck:     Thyroid: No thyroid mass, thyromegaly or thyroid tenderness.     Vascular: No carotid bruit.   Cardiovascular:     Rate and Rhythm: Normal rate and regular rhythm.     Heart sounds: Normal heart sounds. No murmur heard.    No friction rub. No gallop.  Pulmonary:     Effort: Pulmonary effort is normal.     Breath sounds: Normal breath sounds.  Musculoskeletal:     Cervical back: Neck supple. No edema, erythema, signs of trauma, rigidity, torticollis or crepitus. Muscular tenderness present. No pain with movement or spinous process tenderness. Decreased range of motion.  Lymphadenopathy:     Cervical: No cervical adenopathy.  Skin:    General: Skin is warm and dry.     Capillary Refill: Capillary refill takes less than 2 seconds.  Neurological:     General: No focal deficit present.     Mental Status: She is alert and oriented to person, place, and time.     Cranial Nerves: No cranial nerve deficit.     Motor: No weakness.     Gait: Gait normal.  Psychiatric:        Mood and Affect: Mood normal.        Behavior: Behavior normal.        Thought Content: Thought content normal.        Judgment: Judgment normal.     Results for orders placed or performed in visit on 10/26/21  PTH-Related Peptide  Result Value Ref Range   PTH-related peptide <2.0 pmol/L     X-Ray: C-Spine: degenerative  changes. No acute findings. Preliminary x-ray reading by Monia Pouch, FNP-C, [facility].   Pertinent labs & imaging results that were available during my care of the patient were reviewed by me and considered in my medical decision making.  Assessment & Plan:  Amber Diaz was seen today for 3 month follow up.  Diagnoses and all orders for this visit:  Multiple joint pain Will check labs today. I feel this is likely due to letrozole therapy. Pt has an appointment with oncology tomorrow. Will discuss concerns. If not placed on medications to help with symptoms, will discuss options with  pt.  -     CMP14+EGFR -     CBC with Differential/Platelet  Hypercalcemia Will recheck labs today.  -     CMP14+EGFR -     CBC with Differential/Platelet  Neck pain Imaging with degenerative changes, no acute findings. Will notify pt if radiology reading differs. Symptomatic care discussed in detail. Report any new, worsening, or persistent symptoms.  -     DG Cervical Spine Complete     Continue all other maintenance medications.  Follow up plan: Return in about 6 months (around 05/08/2022), or if symptoms worsen or fail to improve.   Continue healthy lifestyle choices, including diet (rich in fruits, vegetables, and lean proteins, and low in salt and simple carbohydrates) and exercise (at least 30 minutes of moderate physical activity daily).  Educational handout given for joint pain  The above assessment and management plan was discussed with the patient. The patient verbalized understanding of and has agreed to the management plan. Patient is aware to call the clinic if they develop any new symptoms or if symptoms persist or worsen. Patient is aware when to return to the clinic for a follow-up visit. Patient educated on when it is appropriate to go to the emergency department.   Monia Pouch, FNP-C [facility] [phone]

## 2021-11-07 ENCOUNTER — Inpatient Hospital Stay (HOSPITAL_COMMUNITY): Payer: Medicare Other | Admitting: Hematology

## 2021-11-07 VITALS — BP 142/75 | HR 72 | Temp 98.7°F | Resp 20 | Ht 66.93 in | Wt 267.0 lb

## 2021-11-07 DIAGNOSIS — Z79811 Long term (current) use of aromatase inhibitors: Secondary | ICD-10-CM | POA: Diagnosis not present

## 2021-11-07 DIAGNOSIS — D0512 Intraductal carcinoma in situ of left breast: Secondary | ICD-10-CM

## 2021-11-07 DIAGNOSIS — Z170 Estrogen receptor positive status [ER+]: Secondary | ICD-10-CM | POA: Diagnosis not present

## 2021-11-07 DIAGNOSIS — Z923 Personal history of irradiation: Secondary | ICD-10-CM | POA: Diagnosis not present

## 2021-11-07 LAB — CBC WITH DIFFERENTIAL/PLATELET
Basophils Absolute: 0.1 10*3/uL (ref 0.0–0.2)
Basos: 1 %
EOS (ABSOLUTE): 0.3 10*3/uL (ref 0.0–0.4)
Eos: 2 %
Hematocrit: 39.2 % (ref 34.0–46.6)
Hemoglobin: 12.7 g/dL (ref 11.1–15.9)
Immature Grans (Abs): 0 10*3/uL (ref 0.0–0.1)
Immature Granulocytes: 0 %
Lymphocytes Absolute: 2.3 10*3/uL (ref 0.7–3.1)
Lymphs: 19 %
MCH: 28.2 pg (ref 26.6–33.0)
MCHC: 32.4 g/dL (ref 31.5–35.7)
MCV: 87 fL (ref 79–97)
Monocytes Absolute: 0.7 10*3/uL (ref 0.1–0.9)
Monocytes: 6 %
Neutrophils Absolute: 8.7 10*3/uL — ABNORMAL HIGH (ref 1.4–7.0)
Neutrophils: 72 %
Platelets: 284 10*3/uL (ref 150–450)
RBC: 4.5 x10E6/uL (ref 3.77–5.28)
RDW: 13.1 % (ref 11.7–15.4)
WBC: 12 10*3/uL — ABNORMAL HIGH (ref 3.4–10.8)

## 2021-11-07 LAB — CMP14+EGFR
ALT: 23 IU/L (ref 0–32)
AST: 16 IU/L (ref 0–40)
Albumin/Globulin Ratio: 1.5 (ref 1.2–2.2)
Albumin: 4.3 g/dL (ref 3.8–4.8)
Alkaline Phosphatase: 123 IU/L — ABNORMAL HIGH (ref 44–121)
BUN/Creatinine Ratio: 23 (ref 12–28)
BUN: 20 mg/dL (ref 8–27)
Bilirubin Total: 0.3 mg/dL (ref 0.0–1.2)
CO2: 22 mmol/L (ref 20–29)
Calcium: 11.6 mg/dL — ABNORMAL HIGH (ref 8.7–10.3)
Chloride: 102 mmol/L (ref 96–106)
Creatinine, Ser: 0.87 mg/dL (ref 0.57–1.00)
Globulin, Total: 2.8 g/dL (ref 1.5–4.5)
Glucose: 95 mg/dL (ref 70–99)
Potassium: 4.5 mmol/L (ref 3.5–5.2)
Sodium: 139 mmol/L (ref 134–144)
Total Protein: 7.1 g/dL (ref 6.0–8.5)
eGFR: 74 mL/min/{1.73_m2} (ref >59)

## 2021-11-07 MED ORDER — OLMESARTAN MEDOXOMIL 40 MG PO TABS: 40.0000 mg | ORAL_TABLET | Freq: Every day | ORAL | 6 refills | Status: DC

## 2021-11-07 NOTE — Patient Instructions (Addendum)
[place] at [facility] Discharge Instructions   You were seen and examined today by Dr. Delton Coombes.  He reviewed your most recent lab work which is normal.   He believes your calcium may be elevated due to one of your blood pressure medications. We will send a prescription for Benicar 40 mg daily. This will be without the HCTZ in it.   We will refer you to an orthopedic doctor for your hip pain.   We will see you back in 4 months with repeat blood work.    Thank you for choosing [place] at [facility] to provide your oncology and hematology care.  To afford each patient quality time with our provider, please arrive at least 15 minutes before your scheduled appointment time.   If you have a lab appointment with the [facility] please come in thru the Main Entrance and check in at the main information desk.  You need to re-schedule your appointment should you arrive 10 or more minutes late.  We strive to give you quality time with our providers, and arriving late affects you and other patients whose appointments are after yours.  Also, if you no show three or more times for appointments you may be dismissed from the clinic at the providers discretion.     Again, thank you for choosing [facility].  Our hope is that these requests will decrease the amount of time that you wait before being seen by our physicians.       _____________________________________________________________  Should you have questions after your visit to [facility], please contact our office at [phone] and follow the prompts.  Our office hours are 8:00 a.m. and 4:30 p.m. Monday - Friday.  Please note that voicemails left after 4:00 p.m. may not be returned until the following business day.  We are closed weekends and major holidays.  You do have access to a nurse 24-7, just call the main number to the clinic [phone] and do not press  any options, hold on the line and a nurse will answer the phone.    For prescription refill requests, have your pharmacy contact our office and allow 72 hours.    Due to Covid, you will need to wear a mask upon entering the hospital. If you do not have a mask, a mask will be given to you at the Main Entrance upon arrival. For doctor visits, patients may have 1 support person age 29 or older with them. For treatment visits, patients can not have anyone with them due to social distancing guidelines and our immunocompromised population.

## 2021-11-07 NOTE — Progress Notes (Signed)
[facility] [address], [place] [address]   Patient Care Team: Rakes, Connye Burkitt, [place] as PCP - General (Family Medicine) Derek Jack, MD as Medical Oncologist (Medical Oncology)  SUMMARY OF ONCOLOGIC HISTORY: Oncology History  Ductal carcinoma in situ (DCIS) of left breast  07/06/2021 Initial Diagnosis   Ductal carcinoma in situ (DCIS) of left breast   07/17/2021 Cancer Staging   Staging form: Breast, AJCC 8th Edition - Clinical stage from 07/17/2021: Stage 0 (cTis (DCIS), cN0, cM0, G3, ER+, PR+, HER2: Not Assessed) - Signed by Derek Jack, MD on 07/17/2021 Stage prefix: Initial diagnosis Nuclear grade: G3 Histologic grading system: 3 grade system     CHIEF COMPLIANT: Follow-up of left breast DCIS   INTERVAL HISTORY: [name] is a 66 y.o. female here today for follow up of her left breast DCIS. Her last visit was on 10/16/2021.   Today she reports feeling well. She reports hip pain bilaterally which is causing difficulty when walking; this pain started in early May and has worsened over the past 10 days. She has been taking 1/2 tablet of Benicar HCT daily.   REVIEW OF SYSTEMS:   Review of Systems  Constitutional:  Positive for fatigue. Negative for appetite change.  Respiratory:  Positive for cough.   Gastrointestinal:  Positive for constipation and diarrhea.  Musculoskeletal:  Positive for arthralgias (6/10).  Neurological:  Positive for dizziness, headaches and numbness.  All other systems reviewed and are negative.   I have reviewed the past medical history, past surgical history, social history and family history with the patient and they are unchanged from previous note.   ALLERGIES:   has No Known Allergies.   MEDICATIONS:  Current Outpatient Medications  Medication Sig Dispense Refill   atorvastatin (LIPITOR) 20 MG tablet Take 1 tablet (20 mg total) by mouth at bedtime. 90 tablet 3   baclofen (LIORESAL) 10 MG tablet  Take 10 mg by mouth 3 (three) times daily as needed.     betamethasone dipropionate 0.05 % cream Apply topically daily as needed.     letrozole (FEMARA) 2.5 MG tablet Take 1 tablet (2.5 mg total) by mouth daily. 90 tablet 3   montelukast (SINGULAIR) 10 MG tablet SMARTSIG:1 Tablet(s) By Mouth Every Evening     olmesartan-hydrochlorothiazide (BENICAR HCT) 40-25 MG tablet Take 1 tablet by mouth every morning.     omeprazole (PRILOSEC) 20 MG capsule Take by mouth.     No current facility-administered medications for this visit.     PHYSICAL EXAMINATION: Performance status (ECOG): 1 - Symptomatic but completely ambulatory  Vitals:   11/07/21 1013  BP: (!) 142/75  Pulse: 72  Resp: 20  Temp: 98.7 F (37.1 C)  SpO2: 96%   Wt Readings from Last 3 Encounters:  11/07/21 266 lb 15.6 oz (121.1 kg)  11/06/21 270 lb (122.5 kg)  10/16/21 267 lb 8 oz (121.3 kg)   Physical Exam  Breast Exam Chaperone: Thana Ates     LABORATORY DATA:  I have reviewed the data as listed    Latest Ref Rng & Units 11/06/2021   11:21 AM 10/09/2021   10:51 AM 07/06/2021   11:36 AM  CMP  Glucose 70 - 99 mg/dL 95  106  85   BUN 8 - 27 mg/dL 20  26  17    Creatinine 0.57 - 1.00 mg/dL 0.87  0.98  0.88   Sodium 134 - 144 mmol/L 139  140  138   Potassium  3.5 - 5.2 mmol/L 4.5  4.1  4.2   Chloride 96 - 106 mmol/L 102  109  102   CO2 20 - 29 mmol/L 22  26  23    Calcium 8.7 - 10.3 mg/dL 11.6  11.5    11.7  10.8   Total Protein 6.0 - 8.5 g/dL 7.1  7.8  7.1   Total Bilirubin 0.0 - 1.2 mg/dL 0.3  0.8  0.3   Alkaline Phos 44 - 121 IU/L 123  99  98   AST 0 - 40 IU/L 16  22  21    ALT 0 - 32 IU/L 23  28  23     No results found for: "VOZ366" Lab Results  Component Value Date   WBC 12.0 (H) 11/06/2021   HGB 12.7 11/06/2021   HCT 39.2 11/06/2021   MCV 87 11/06/2021   PLT 284 11/06/2021   NEUTROABS 8.7 (H) 11/06/2021    ASSESSMENT:  Left breast high-grade DCIS, ER positive: - Screening mammogram on 08/12/2020  with asymmetry in the outer left breast in [place], [place]. - Diagnostic mammogram/ultrasound and US biopsy on 09/05/2020, pathology consistent with high-grade DCIS, ER/PR positive. - Lumpectomy and SLN biopsy on 10/03/2020 - Pathology with 1.4 cm DCIS, margins negative, 4/4 lymph nodes negative. - Status post XRT to the left breast - She was reportedly negative for genetic testing.  She had previous right breast biopsies x4 (1996, 2006, 2021). - She was started on anastrozole, developed nausea, vomiting and abdominal pain - Exemestane started on 01/10/2021, discontinued in the first week of February 2023 secondary to hypertension. - Letrozole started on 07/17/2021. - She has some hot flashes which started after taking anastrozole but never went away.  They are tolerable. - She also has joint pains in the ankles, knees, hips, shoulders and wrists which are chronic and stable.    Social/family history: - She is retired Glass blower/designer.  Recently moved from [place] to [place]. - She smoked 2 packs/day from age 71 through 28 and quit. - Mother had breast cancer at age 65 and 63.  Maternal grandmother had breast cancer at age 70.  Father had cancer on cervical spine.  Primary unknown.   PLAN:  Left breast DCIS, high-grade, ER/PR positive: - She reports she is tolerating letrozole very well. - She reports bilateral hip pain since May, but have gotten worse in the last 10 days.  She was also walking more. - It could be related to Femara.  However osteoarthritis is also possibility.  Recommend follow-up with orthopedics. - RTC 4 months for follow-up with repeat labs.  2.  Osteopenia: - DEXA scan in July 2022 done in [place] showed T score -1.4, osteopenia. - She is not on calcium and vitamin D supplements due to hypercalcemia.  3.  Mild hypercalcemia: - Vitamin D and one 25-hydroxy vitamin D levels were noncontributory.  SPEP, immunofixation and free light chain ratio  were negative.  PTH RP was normal.  Intact PTH was 46. - She is not on any supplements of calcium or vitamin D. - 24-hour urine shows calcium was normal. - I have recommended discontinuing HCTZ part of Benicar HCT. - She will take olmesartan 40 mg daily.  If the blood pressure is not controlled well, will add Norvasc. - Plan to repeat labs in 4 months.  If no improvement, will consider neck ultrasound and referral to surgery.  Breast Cancer therapy associated bone loss: I have recommended calcium, Vitamin D and  weight bearing exercises.  Orders placed this encounter:  No orders of the defined types were placed in this encounter.   The patient has a good understanding of the overall plan. She agrees with it. She will call with any problems that may develop before the next visit here.  Derek Jack, MD [facility] [phone]   I, Thana Ates, am acting as a scribe for Dr. Derek Jack.  I, Derek Jack MD, have reviewed the above documentation for accuracy and completeness, and I agree with the above.

## 2021-11-13 ENCOUNTER — Ambulatory Visit (INDEPENDENT_AMBULATORY_CARE_PROVIDER_SITE_OTHER): Payer: Medicare Other

## 2021-11-13 ENCOUNTER — Encounter: Payer: Self-pay | Admitting: Orthopedic Surgery

## 2021-11-13 ENCOUNTER — Ambulatory Visit: Payer: Medicare Other | Admitting: Orthopedic Surgery

## 2021-11-13 VITALS — BP 176/82 | HR 69 | Ht 67.0 in | Wt 266.0 lb

## 2021-11-13 DIAGNOSIS — M160 Bilateral primary osteoarthritis of hip: Secondary | ICD-10-CM

## 2021-11-13 DIAGNOSIS — M25551 Pain in right hip: Secondary | ICD-10-CM

## 2021-11-13 DIAGNOSIS — M25552 Pain in left hip: Secondary | ICD-10-CM

## 2021-11-13 NOTE — Patient Instructions (Signed)
Please call to schedule your appointment with [facility]:  Central Scheduling [phone]

## 2021-11-14 NOTE — Progress Notes (Signed)
New Patient Visit  Assessment: Amber Diaz is a 66 y.o. female with the following: 1. Bilateral hip pain; degenerative changes including loss of joint space and associated osteophytes on the acetabulum  Plan: Amber Diaz has pain in bilateral groin areas.  Radiographs demonstrate moderate degenerative changes.  She has loss of joint space, as well as some large osteophytes of the acetabulum.  Right hip is currently worse than the left.  She has failed medications.  She does have a history of lower back pain, as well as some bursitis, but her current presentation is most consistent with hip arthritis.  We discussed proceeding with an injection, and she would like to proceed.  She would like to proceed with a right hip injection first.  This will be scheduled as an outpatient at [facility].  She will follow-up as needed.  Follow-up: Return if symptoms worsen or fail to improve.  Subjective:  Chief Complaint  Patient presents with   Hip Pain    Bilateral hip / groin pain had injections done in [place] was told bursitis     History of Present Illness: Amber Diaz is a 66 y.o. female who has been referred by  Derek Jack, MD for evaluation of bilateral hip pain.  She has had progressively worsening pain in the bilateral groin areas for the past couple of years.  She has been treated for lower back pain, as well as greater trochanteric bursitis.  She is currently complaining of pain that starts in the anterior hip, and radiates into her groin.  She is not taking medications.  No prior injuries.  No prior injections in her hip.  She has had injections in the lateral hip area.  Pain worsens when she is on her feet for extended periods of time.   Review of Systems: No fevers or chills No numbness or tingling No chest pain No shortness of breath No bowel or bladder dysfunction No GI distress No headaches   Medical History:  Past Medical History:  Diagnosis Date   Breast  cancer ([place])    HTN (hypertension)    Psoriasis     Past Surgical History:  Procedure Laterality Date   BREAST SURGERY     CESAREAN SECTION     EYE SURGERY     TUBAL LIGATION      Family History  Problem Relation Age of Onset   Breast cancer Mother    Heart attack Mother    Bone cancer Father    Breast cancer Maternal Grandmother    Obesity Son    Social History   Tobacco Use   Smoking status: Former    Types: Cigarettes   Smokeless tobacco: Never  Vaping Use   Vaping Use: Never used  Substance Use Topics   Alcohol use: Not Currently   Drug use: Never    No Known Allergies  Current Meds  Medication Sig   atorvastatin (LIPITOR) 20 MG tablet Take 1 tablet (20 mg total) by mouth at bedtime.   baclofen (LIORESAL) 10 MG tablet Take 10 mg by mouth 3 (three) times daily as needed.   betamethasone dipropionate 0.05 % cream Apply topically daily as needed.   letrozole (FEMARA) 2.5 MG tablet Take 1 tablet (2.5 mg total) by mouth daily.   montelukast (SINGULAIR) 10 MG tablet SMARTSIG:1 Tablet(s) By Mouth Every Evening   olmesartan (BENICAR) 40 MG tablet Take 1 tablet (40 mg total) by mouth daily.   omeprazole (PRILOSEC) 20 MG capsule Take by mouth.  Objective: BP (!) 176/82   Pulse 69   Ht '5\' 7"'$  (1.702 m)   Wt 266 lb (120.7 kg)   BMI 41.66 kg/m   Physical Exam:  General: Alert and oriented. and No acute distress. Gait: Slow, steady gait.  Mild tenderness to palpation over the lateral right hip.  She has pain with flexion beyond 90 degrees.  Limited internal rotation in her right hip, with pain.  Slightly more internal rotation of the left hip, but also with pain.  She also experiences pain with abduction, and external rotation.  She demonstrates weakness with a straight leg raise, but is able to maintain it against gravity.  IMAGING: I personally ordered and reviewed the following images  X-rays of bilateral hips were obtained in clinic today.  No comparisons  were available.  She has loss of joint space within the right hip, with less joint space narrowing in the left hip.  Bilaterally, she has osteophytes of the lateral aspect of the acetabulum.  No evidence of AVN.  No acute injuries are noted.  Impression: Bilateral hip arthritis, right more advanced than the left.   New Medications:  No orders of the defined types were placed in this encounter.     Mordecai Rasmussen, MD  11/14/2021 8:01 AM

## 2021-11-26 ENCOUNTER — Ambulatory Visit (HOSPITAL_COMMUNITY)
Admission: RE | Admit: 2021-11-26 | Discharge: 2021-11-26 | Disposition: A | Discharge status: Home / Self Care | Payer: Medicare Other | Source: Ambulatory Visit | Attending: Orthopedic Surgery | Admitting: Orthopedic Surgery

## 2021-11-26 ENCOUNTER — Encounter (HOSPITAL_COMMUNITY): Payer: Self-pay

## 2021-11-26 DIAGNOSIS — G8929 Other chronic pain: Secondary | ICD-10-CM | POA: Diagnosis not present

## 2021-11-26 DIAGNOSIS — M25552 Pain in left hip: Secondary | ICD-10-CM | POA: Insufficient documentation

## 2021-11-26 DIAGNOSIS — M25551 Pain in right hip: Secondary | ICD-10-CM

## 2021-11-26 MED ORDER — LIDOCAINE HCL (PF) 1 % IJ SOLN
INTRAMUSCULAR | Status: AC
  Administered 2021-11-26: 5 mL
  Filled 2021-11-26: qty 5

## 2021-11-26 MED ORDER — BUPIVACAINE HCL (PF) 0.5 % IJ SOLN
INTRAMUSCULAR | Status: AC
  Administered 2021-11-26: 5 mL
  Filled 2021-11-26: qty 30

## 2021-11-26 MED ORDER — IOHEXOL 180 MG/ML  SOLN
20.0000 mL | Freq: Once | INTRAMUSCULAR | Status: AC | PRN
  Administered 2021-11-26: 10 mL

## 2021-11-26 MED ORDER — METHYLPREDNISOLONE ACETATE 40 MG/ML IJ SUSP
INTRAMUSCULAR | Status: AC
  Administered 2021-11-26: 40 mg
  Filled 2021-11-26: qty 1

## 2021-11-26 NOTE — Procedures (Signed)
CLINICAL DATA: Right hip pain EXAM: RIGHT HIP INJECTION UNDER FLUOROSCOPY COMPARISON: Radiograph 11/13/2021 FLUOROSCOPY: Radiation Exposure Index (as provided by the fluoroscopic device): 28.2 mGy Kerma PROCEDURE:  I discussed the risks (including hemorrhage, infection, and local anesthetic chondotoxicity, among others), benefits, and alternatives to the procedure with the patient.  We specifically discussed the high technical likelihood of success of the procedure.  The patient understood and elected to undergo the procedure.    Standard time-out was employed.  Following sterile skin prep and local anesthetic administration consisting of 1% lidocaine, initial attempts to access the right hip joint using a 3.5 inch 22 gauge needle were unsuccessful, with small volumes of injected Omnipaque 300 contrast medium tracking along the iloipsoas.  It was apparent that the 3.5 inch needle was not quite long enough to access the joint.  Subsequently, a 20 gauge 5 inch needle was advanced without difficulty into the right hip joint under fluoroscopic guidance, intra-articular location confirmed with brief contrast injection.  Forty mg Depo-Medrol and 5 ml Sensorcaine 0.5% were then administered.  The needle was subsequently removed and the skin cleansed and bandaged.  No immediate complications were observed.   IMPRESSION: Technically successful therapeutic right hip injection under fluoroscopy.

## 2021-12-11 DIAGNOSIS — H04123 Dry eye syndrome of bilateral lacrimal glands: Secondary | ICD-10-CM | POA: Diagnosis not present

## 2022-01-08 DIAGNOSIS — D225 Melanocytic nevi of trunk: Secondary | ICD-10-CM | POA: Diagnosis not present

## 2022-01-08 DIAGNOSIS — Z1283 Encounter for screening for malignant neoplasm of skin: Secondary | ICD-10-CM | POA: Diagnosis not present

## 2022-01-09 ENCOUNTER — Other Ambulatory Visit: Payer: Self-pay | Admitting: *Deleted

## 2022-01-09 MED ORDER — LETROZOLE 2.5 MG PO TABS: 2.5000 mg | ORAL_TABLET | Freq: Every day | ORAL | 3 refills | Status: DC

## 2022-01-09 MED ORDER — OLMESARTAN MEDOXOMIL 40 MG PO TABS: 40.0000 mg | ORAL_TABLET | Freq: Every day | ORAL | 6 refills | Status: DC

## 2022-01-24 ENCOUNTER — Other Ambulatory Visit: Payer: Self-pay | Admitting: Family Medicine

## 2022-01-24 DIAGNOSIS — K219 Gastro-esophageal reflux disease without esophagitis: Secondary | ICD-10-CM

## 2022-01-24 DIAGNOSIS — E782 Mixed hyperlipidemia: Secondary | ICD-10-CM

## 2022-01-24 DIAGNOSIS — Z9109 Other allergy status, other than to drugs and biological substances: Secondary | ICD-10-CM

## 2022-01-24 DIAGNOSIS — M2550 Pain in unspecified joint: Secondary | ICD-10-CM

## 2022-01-24 MED ORDER — ATORVASTATIN CALCIUM 20 MG PO TABS: 20.0000 mg | ORAL_TABLET | Freq: Every day | ORAL | 1 refills | Status: DC

## 2022-01-24 MED ORDER — MONTELUKAST SODIUM 10 MG PO TABS: ORAL_TABLET | ORAL | 1 refills | Status: DC

## 2022-01-24 MED ORDER — OMEPRAZOLE 20 MG PO CPDR: 20.0000 mg | DELAYED_RELEASE_CAPSULE | Freq: Every day | ORAL | 1 refills | Status: DC

## 2022-01-24 MED ORDER — BACLOFEN 10 MG PO TABS: 10.0000 mg | ORAL_TABLET | Freq: Three times a day (TID) | ORAL | 0 refills | Status: DC | PRN

## 2022-01-24 NOTE — Telephone Encounter (Signed)
  Prescription Request  01/24/2022  Is this a "Controlled Substance" medicine? no  Have you seen your PCP in the last 2 weeks? no  If YES, route message to pool  -  If NO, patient needs to be scheduled for appointment.  What is the name of the medication or equipment? omeprazole (PRILOSEC) 20 MG capsule, atorvastatin (LIPITOR) 20 MG tablet, baclofen (LIORESAL) 10 MG tablet, montelukast (SINGULAIR) 10 MG tablet  Have you contacted your pharmacy to request a refill? yes   Which pharmacy would you like this sent to? [facility] ([facility] - [place], [place]   Patient notified that their request is being sent to the clinical staff for review and that they should receive a response within 2 business days.

## 2022-03-07 ENCOUNTER — Inpatient Hospital Stay: Payer: Medicare Other | Attending: Hematology

## 2022-03-07 DIAGNOSIS — M8580 Other specified disorders of bone density and structure, unspecified site: Secondary | ICD-10-CM | POA: Diagnosis not present

## 2022-03-07 DIAGNOSIS — D0512 Intraductal carcinoma in situ of left breast: Secondary | ICD-10-CM | POA: Diagnosis not present

## 2022-03-07 DIAGNOSIS — Z79899 Other long term (current) drug therapy: Secondary | ICD-10-CM | POA: Insufficient documentation

## 2022-03-07 DIAGNOSIS — Z79811 Long term (current) use of aromatase inhibitors: Secondary | ICD-10-CM | POA: Diagnosis not present

## 2022-03-07 LAB — COMPREHENSIVE METABOLIC PANEL
ALT: 24 U/L (ref 0–44)
AST: 18 U/L (ref 15–41)
Albumin: 4.1 g/dL (ref 3.5–5.0)
Alkaline Phosphatase: 119 U/L (ref 38–126)
Anion gap: 7 (ref 5–15)
BUN: 20 mg/dL (ref 8–23)
CO2: 24 mmol/L (ref 22–32)
Calcium: 11.4 mg/dL — ABNORMAL HIGH (ref 8.9–10.3)
Chloride: 108 mmol/L (ref 98–111)
Creatinine, Ser: 0.9 mg/dL (ref 0.44–1.00)
GFR, Estimated: >60 mL/min (ref >60)
Glucose, Bld: 91 mg/dL (ref 70–99)
Potassium: 4.1 mmol/L (ref 3.5–5.1)
Sodium: 139 mmol/L (ref 135–145)
Total Bilirubin: 1.2 mg/dL (ref 0.3–1.2)
Total Protein: 7.4 g/dL (ref 6.5–8.1)

## 2022-03-07 LAB — CBC WITH DIFFERENTIAL/PLATELET
Abs Immature Granulocytes: 0.04 10*3/uL (ref 0.00–0.07)
Basophils Absolute: 0.1 10*3/uL (ref 0.0–0.1)
Basophils Relative: 1 %
Eosinophils Absolute: 0.2 10*3/uL (ref 0.0–0.5)
Eosinophils Relative: 2 %
HCT: 41.5 % (ref 36.0–46.0)
Hemoglobin: 13.3 g/dL (ref 12.0–15.0)
Immature Granulocytes: 0 %
Lymphocytes Relative: 23 %
Lymphs Abs: 2.2 10*3/uL (ref 0.7–4.0)
MCH: 28.5 pg (ref 26.0–34.0)
MCHC: 32 g/dL (ref 30.0–36.0)
MCV: 89.1 fL (ref 80.0–100.0)
Monocytes Absolute: 0.6 10*3/uL (ref 0.1–1.0)
Monocytes Relative: 7 %
Neutro Abs: 6.3 10*3/uL (ref 1.7–7.7)
Neutrophils Relative %: 67 %
Platelets: 302 10*3/uL (ref 150–400)
RBC: 4.66 MIL/uL (ref 3.87–5.11)
RDW: 13.4 % (ref 11.5–15.5)
WBC: 9.4 10*3/uL (ref 4.0–10.5)
nRBC: 0 % (ref 0.0–0.2)

## 2022-03-07 LAB — VITAMIN D 25 HYDROXY (VIT D DEFICIENCY, FRACTURES): Vit D, 25-Hydroxy: 22.61 ng/mL — ABNORMAL LOW (ref 30–100)

## 2022-03-13 ENCOUNTER — Other Ambulatory Visit: Payer: Self-pay | Admitting: Hematology

## 2022-03-13 ENCOUNTER — Inpatient Hospital Stay: Payer: Medicare Other | Admitting: Hematology

## 2022-03-13 VITALS — BP 124/83 | HR 83 | Temp 98.0°F | Resp 18 | Ht 67.0 in | Wt 262.6 lb

## 2022-03-13 DIAGNOSIS — M8580 Other specified disorders of bone density and structure, unspecified site: Secondary | ICD-10-CM | POA: Diagnosis not present

## 2022-03-13 DIAGNOSIS — D0512 Intraductal carcinoma in situ of left breast: Secondary | ICD-10-CM

## 2022-03-13 DIAGNOSIS — Z79811 Long term (current) use of aromatase inhibitors: Secondary | ICD-10-CM | POA: Diagnosis not present

## 2022-03-13 DIAGNOSIS — Z79899 Other long term (current) drug therapy: Secondary | ICD-10-CM | POA: Diagnosis not present

## 2022-03-13 NOTE — Patient Instructions (Addendum)
[facility]  Discharge Instructions  You were seen and examined today by Dr. Delton Coombes.  Dr. Delton Coombes discussed your most recent lab work which revealed that your Vitamin D is low and your Calcium is slightly high. Your Calcium being slightly high can come from parathyroid gland. Dr. Delton Coombes recommends having a thyroid ultrasound to see if the parathyroid is functioning properly.  Continue taking Vitamin D and Femara as prescribed. Next mammogram will be in March 2024.   Follow-up as scheduled in 6 months with labs.    Thank you for choosing [facility] to provide your oncology and hematology care.   To afford each patient quality time with our provider, please arrive at least 15 minutes before your scheduled appointment time. You may need to reschedule your appointment if you arrive late (10 or more minutes). Arriving late affects you and other patients whose appointments are after yours.  Also, if you miss three or more appointments without notifying the office, you may be dismissed from the clinic at the provider's discretion.    Again, thank you for choosing [facility].  Our hope is that these requests will decrease the amount of time that you wait before being seen by our physicians.   If you have a lab appointment with the [facility] please come in thru the Main Entrance and check in at the main information desk.           _____________________________________________________________  Should you have questions after your visit to [facility], please contact our office at [phone] and follow the prompts.  Our office hours are 8:00 a.m. to 4:30 p.m. Monday - Thursday and 8:00 a.m. to 2:30 p.m. Friday.  Please note that voicemails left after 4:00 p.m. may not be returned until the following business day.  We are closed weekends and all major holidays.  You do have access to a nurse 24-7, just  call the main number to the clinic [phone] and do not press any options, hold on the line and a nurse will answer the phone.    For prescription refill requests, have your pharmacy contact our office and allow 72 hours.    Masks are optional in the cancer centers. If you would like for your care team to wear a mask while they are taking care of you, please let them know. You may have one support person who is at least 66 years old accompany you for your appointments.

## 2022-03-13 NOTE — Progress Notes (Signed)
[name] [address], [place] [address]   Patient Care Team: Rakes, Connye Burkitt, [place] as PCP - General (Family Medicine) Derek Jack, MD as Medical Oncologist (Medical Oncology)  SUMMARY OF ONCOLOGIC HISTORY: Oncology History  Ductal carcinoma in situ (DCIS) of left breast  07/06/2021 Initial Diagnosis   Ductal carcinoma in situ (DCIS) of left breast   07/17/2021 Cancer Staging   Staging form: Breast, AJCC 8th Edition - Clinical stage from 07/17/2021: Stage 0 (cTis (DCIS), cN0, cM0, G3, ER+, PR+, HER2: Not Assessed) - Signed by Derek Jack, MD on 07/17/2021 Stage prefix: Initial diagnosis Nuclear grade: G3 Histologic grading system: 3 grade system     CHIEF COMPLIANT: Follow-up of left breast DCIS   INTERVAL HISTORY: Amber Diaz is a 66 y.o. female seen for follow-up of left breast DCIS.  She is tolerating letrozole reasonably well.  She has hot flashes which are manageable.  She reported left hip and shoulder pains since May of this year.  She started letrozole in February.  REVIEW OF SYSTEMS:   Review of Systems  Constitutional:  Negative for appetite change.  Endocrine: Positive for hot flashes.  Musculoskeletal:  Positive for arthralgias (6/10).  All other systems reviewed and are negative.   I have reviewed the past medical history, past surgical history, social history and family history with the patient and they are unchanged from previous note.   ALLERGIES:   has No Known Allergies.   MEDICATIONS:  Current Outpatient Medications  Medication Sig Dispense Refill   atorvastatin (LIPITOR) 20 MG tablet Take 1 tablet (20 mg total) by mouth at bedtime. 90 tablet 1   baclofen (LIORESAL) 10 MG tablet Take 1 tablet (10 mg total) by mouth 3 (three) times daily as needed. 30 each 0   betamethasone dipropionate 0.05 % cream Apply topically daily as needed.     letrozole (FEMARA) 2.5 MG tablet Take 1 tablet (2.5 mg total) by mouth  daily. 90 tablet 3   montelukast (SINGULAIR) 10 MG tablet SMARTSIG:1 Tablet(s) By Mouth Every Evening 90 tablet 1   olmesartan (BENICAR) 40 MG tablet Take 1 tablet (40 mg total) by mouth daily. 30 tablet 6   omeprazole (PRILOSEC) 20 MG capsule Take 1 capsule (20 mg total) by mouth daily. 90 capsule 1   No current facility-administered medications for this visit.     PHYSICAL EXAMINATION: Performance status (ECOG): 1 - Symptomatic but completely ambulatory  Vitals:   03/13/22 1531  BP: 124/83  Pulse: 83  Resp: 18  Temp: 98 F (36.7 C)  SpO2: 95%   Wt Readings from Last 3 Encounters:  03/13/22 262 lb 9.6 oz (119.1 kg)  11/13/21 266 lb (120.7 kg)  11/07/21 266 lb 15.6 oz (121.1 kg)   Physical Exam Vitals reviewed.  Constitutional:      Appearance: Normal appearance.  Cardiovascular:     Rate and Rhythm: Normal rate and regular rhythm.     Heart sounds: Normal heart sounds.  Pulmonary:     Breath sounds: Normal breath sounds.  Neurological:     Mental Status: She is alert.  Psychiatric:        Mood and Affect: Mood normal.        Behavior: Behavior normal.   Left breast lumpectomy scar is within normal limits.  No palpable masses in bilateral breasts.  No palpable adenopathy.  Breast Exam Chaperone: Chasity Edwards   LABORATORY DATA:  I have reviewed the data as listed  Latest Ref Rng & Units 03/07/2022    2:01 PM 11/06/2021   11:21 AM 10/09/2021   10:51 AM  CMP  Glucose 70 - 99 mg/dL 91  95  106   BUN 8 - 23 mg/dL _0 Creatinine 0.44 - 1.00 mg/dL 0.90  0.87  0.98   Sodium 135 - 145 mmol/L 139  139  140   Potassium 3.5 - 5.1 mmol/L 4.1  4.5  4.1   Chloride 98 - 111 mmol/L 108  102  109   CO2 22 - 32 mmol/L _1 Calcium 8.9 - 10.3 mg/dL 11.4  11.6  11.5    11.7   Total Protein 6.5 - 8.1 g/dL 7.4  7.1  7.8   Total Bilirubin 0.3 - 1.2 mg/dL 1.2  0.3  0.8   Alkaline Phos 38 - 126 U/L 119  123  99   AST 15 - 41 U/L _2 ALT 0 - 44 U/L  _3 No results found for: "CAN153" Lab Results  Component Value Date   WBC 9.4 03/07/2022   HGB 13.3 03/07/2022   HCT 41.5 03/07/2022   MCV 89.1 03/07/2022   PLT 302 03/07/2022   NEUTROABS 6.3 03/07/2022    ASSESSMENT:  Left breast high-grade DCIS, ER positive: - Screening mammogram on 08/12/2020 with asymmetry in the outer left breast in [place], [place]. - Diagnostic mammogram/ultrasound and US biopsy on 09/05/2020, pathology consistent with high-grade DCIS, ER/PR positive. - Lumpectomy and SLN biopsy on 10/03/2020 - Pathology with 1.4 cm DCIS, margins negative, 4/4 lymph nodes negative. - Status post XRT to the left breast - She was reportedly negative for genetic testing.  She had previous right breast biopsies x4 (1996, 2006, 2021). - She was started on anastrozole, developed nausea, vomiting and abdominal pain - Exemestane started on 01/10/2021, discontinued in the first week of February 2023 secondary to hypertension. - Letrozole started on 07/17/2021. - She has some hot flashes which started after taking anastrozole but never went away.  They are tolerable. - She also has joint pains in the ankles, knees, hips, shoulders and wrists which are chronic and stable.    Social/family history: - She is retired Glass blower/designer.  Recently moved from [place] to [place]. - She smoked 2 packs/day from age 55 through 75 and quit. - Mother had breast cancer at age 67 and 65.  Maternal grandmother had breast cancer at age 44.  Father had cancer on cervical spine.  Primary unknown.   PLAN:  Left breast DCIS, high-grade, ER/PR positive: - She is tolerating letrozole reasonably well.  She had developed left groin pain and shoulder pain since May.  She started letrozole in February. - She was evaluated by orthopedics and was referred for injection by IR.  She did not see any improvement. - These pains are not limiting her day-to-day activities significantly.  We  reviewed her labs which showed normal LFTs and CBC.  Vitamin D is low at 22. - We will schedule her for mammogram after 08/22/2022.  RTC 6 months for follow-up.  2.  Osteopenia (DEXA July 2022 T score -1.4): - She is not on calcium and vitamin D supplements due to hypercalcemia. - Her vitamin D level is 22.  3.  Mild hypercalcemia: - Previous work-up showed normal vitamin D and 25-hydroxy vitamin D.  SPEP, immunofixation and free  light chains were negative.  PTH RP was normal.  Intact PTH was 46.  She is not on any calcium supplements.  Her calcium today is elevated at 11.4.  I have recommended ultrasound of the parathyroids.  If there is any adenomas or hyperplasia, referral will be made to surgery.  We will talk to her on the phone after the ultrasound is completed.  Breast Cancer therapy associated bone loss: I have recommended calcium, Vitamin D and weight bearing exercises.  Orders placed this encounter:  Orders Placed This Encounter  Procedures   US Soft Tissue Head/Neck   MM DIAG BREAST TOMO BILATERAL   CBC with Differential/Platelet   Comprehensive metabolic panel   VITAMIN D 25 Hydroxy (Vit-D Deficiency, Fractures)     The patient has a good understanding of the overall plan. She agrees with it. She will call with any problems that may develop before the next visit here.  Derek Jack, MD [facility] [phone]

## 2022-03-14 ENCOUNTER — Ambulatory Visit (HOSPITAL_COMMUNITY): Payer: Medicare Other | Admitting: Hematology

## 2022-03-20 ENCOUNTER — Other Ambulatory Visit: Payer: Self-pay | Admitting: Family Medicine

## 2022-03-20 DIAGNOSIS — K219 Gastro-esophageal reflux disease without esophagitis: Secondary | ICD-10-CM

## 2022-03-20 DIAGNOSIS — E782 Mixed hyperlipidemia: Secondary | ICD-10-CM

## 2022-03-20 DIAGNOSIS — Z9109 Other allergy status, other than to drugs and biological substances: Secondary | ICD-10-CM

## 2022-03-21 ENCOUNTER — Ambulatory Visit (HOSPITAL_COMMUNITY)
Admission: RE | Admit: 2022-03-21 | Discharge: 2022-03-21 | Disposition: A | Discharge status: Home / Self Care | Payer: Medicare Other | Source: Ambulatory Visit | Attending: Hematology | Admitting: Hematology

## 2022-03-21 DIAGNOSIS — E041 Nontoxic single thyroid nodule: Secondary | ICD-10-CM | POA: Diagnosis not present

## 2022-03-21 DIAGNOSIS — D0512 Intraductal carcinoma in situ of left breast: Secondary | ICD-10-CM | POA: Insufficient documentation

## 2022-03-25 ENCOUNTER — Telehealth: Payer: Self-pay | Admitting: Family Medicine

## 2022-03-25 ENCOUNTER — Inpatient Hospital Stay (HOSPITAL_BASED_OUTPATIENT_CLINIC_OR_DEPARTMENT_OTHER): Payer: Medicare Other | Admitting: Hematology

## 2022-03-25 VITALS — Wt 264.0 lb

## 2022-03-25 DIAGNOSIS — K219 Gastro-esophageal reflux disease without esophagitis: Secondary | ICD-10-CM

## 2022-03-25 DIAGNOSIS — E041 Nontoxic single thyroid nodule: Secondary | ICD-10-CM | POA: Diagnosis not present

## 2022-03-25 DIAGNOSIS — D0512 Intraductal carcinoma in situ of left breast: Secondary | ICD-10-CM | POA: Diagnosis not present

## 2022-03-25 NOTE — Progress Notes (Signed)
Virtual Visit via Telephone Note  I connected with Amber Diaz on 03/25/22 at  4:00 PM EDT by telephone and verified that I am speaking with the correct person using two identifiers.  Location: Patient: At home Provider: In the office   I discussed the limitations, risks, security and privacy concerns of performing an evaluation and management service by telephone and the availability of in person appointments. I also discussed with the patient that there may be a patient responsible charge related to this service. The patient expressed understanding and agreed to proceed.   History of Present Illness: She follows up in our clinic for left breast high-grade DCIS.  She was also found to have mild hypercalcemia.   Observations/Objective: She reportedly developed epigastric abdominal pain for the past few weeks and has stopped taking all her medications except Benicar.  She gets pain after eating.  She has tried taking Maalox which only helped for couple of hours.  She was taking omeprazole 40 mg daily, which was decreased to 20 mg daily.  Assessment and Plan:  1.  Mild hypercalcemia: - I have reviewed ultrasound soft tissue neck from 03/21/2022.  No definitive extrathyroidal nodules identified to suggest the presence of a parathyroid adenoma.  However 2.6 cm exophytic ill-defined nodule in the inferior pole of the left lower thyroid was seen. - Recommend  ultrasound-guided biopsy of the thyroid nodule. - We will also schedule for nuclear medicine parathyroid scintigraphy. - RTC 1 week after the biopsy.  2.  Left breast DCIS, high-grade, ER/PR positive: - She has stopped taking letrozole for the past 10 days along with all other medications except Benicar for epigastric pain. - I have recommended her to talk to her doctor to increase omeprazole to 40 mg daily.  After the epigastric pain issue resolved, she will restart back on letrozole.   Follow Up Instructions:    I discussed the  assessment and treatment plan with the patient. The patient was provided an opportunity to ask questions and all were answered. The patient agreed with the plan and demonstrated an understanding of the instructions.   The patient was advised to call back or seek an in-person evaluation if the symptoms worsen or if the condition fails to improve as anticipated.  I provided 21 minutes of non-face-to-face time during this encounter.   Derek Jack, MD

## 2022-03-25 NOTE — Telephone Encounter (Signed)
Patient said her omeprazole (PRILOSEC) 20 MG capsule was changed and she is now having stomach pains. Per patient, her cancer doctor thinks that the two may be associated. Please call back and advise.

## 2022-03-27 MED ORDER — OMEPRAZOLE 20 MG PO CPDR: 20.0000 mg | DELAYED_RELEASE_CAPSULE | Freq: Every day | ORAL | 0 refills | Status: DC

## 2022-03-27 NOTE — Telephone Encounter (Signed)
Dose: 20 mg Route: Oral Frequency: Daily  Dispense Quantity: 100 capsule Refills: 0   Note to Pharmacy: Please send a replace/new response with 100-Day Supply if appropriate to maximize member benefit.       Sig: TAKE 1 CAPSULE BY MOUTH DAILY  Patient not taking: Reported on 03/25/2022       Start Date: 03/21/22 End Date: --  Written Date: 03/21/22 Expiration Date: 03/21/23     Associated Diagnoses: Gastroesophageal reflux disease without esophagitis [K21.9]  Original Order:  omeprazole (PRILOSEC) 20 MG capsule [[id number]]  Providers  Ordering and Authorizing Provider:   Baruch Gouty, [place], [place] [address]  Phone:  [phone]   Fax:  [phone]  DEA #:  [id number]   NPI:  [id number]      Ordering User:  Georga Kaufmann, [place], [place]  [address] [place] [address]  Phone:  [phone]  Fax:  [phone]  DEA #:  --  DAW Reason: --  Spoke with patient and she said she does not know why it was changed. That she just went to pick it up and it was '20mg'$ .  Looks like it was sent by a nurse.

## 2022-03-27 NOTE — Addendum Note (Signed)
Addended by: Baruch Gouty on: 03/27/2022 01:41 PM   Modules accepted: Orders

## 2022-04-08 ENCOUNTER — Other Ambulatory Visit: Payer: Self-pay

## 2022-04-08 ENCOUNTER — Other Ambulatory Visit: Payer: Self-pay | Admitting: Hematology

## 2022-04-08 DIAGNOSIS — E041 Nontoxic single thyroid nodule: Secondary | ICD-10-CM

## 2022-04-08 NOTE — Progress Notes (Signed)
Biopsy order placed per Dr. Delton Coombes

## 2022-04-09 ENCOUNTER — Other Ambulatory Visit: Payer: Self-pay

## 2022-04-09 DIAGNOSIS — E041 Nontoxic single thyroid nodule: Secondary | ICD-10-CM

## 2022-04-09 NOTE — Progress Notes (Signed)
Parathyoid w/SPECT order placed per Dr. Delton Coombes

## 2022-04-17 ENCOUNTER — Encounter (HOSPITAL_COMMUNITY): Payer: Self-pay

## 2022-04-17 ENCOUNTER — Ambulatory Visit (HOSPITAL_COMMUNITY)
Admission: RE | Admit: 2022-04-17 | Discharge: 2022-04-17 | Disposition: A | Discharge status: Home / Self Care | Payer: Medicare Other | Source: Ambulatory Visit | Attending: Hematology | Admitting: Hematology

## 2022-04-17 DIAGNOSIS — E041 Nontoxic single thyroid nodule: Secondary | ICD-10-CM | POA: Diagnosis not present

## 2022-04-17 DIAGNOSIS — E042 Nontoxic multinodular goiter: Secondary | ICD-10-CM | POA: Diagnosis not present

## 2022-04-17 MED ORDER — LIDOCAINE HCL (PF) 2 % IJ SOLN
INTRAMUSCULAR | Status: AC
  Filled 2022-04-17: qty 10

## 2022-04-19 LAB — CYTOLOGY - NON PAP

## 2022-04-23 ENCOUNTER — Ambulatory Visit (HOSPITAL_COMMUNITY): Payer: Medicare Other

## 2022-04-23 ENCOUNTER — Encounter (HOSPITAL_COMMUNITY): Payer: Self-pay

## 2022-04-24 ENCOUNTER — Ambulatory Visit: Payer: Medicare Other | Admitting: Hematology

## 2022-04-25 ENCOUNTER — Inpatient Hospital Stay: Payer: Medicare Other | Admitting: Hematology

## 2022-05-02 ENCOUNTER — Encounter (HOSPITAL_COMMUNITY)
Admission: RE | Admit: 2022-05-02 | Discharge: 2022-05-02 | Disposition: A | Discharge status: Home / Self Care | Payer: Medicare Other | Source: Ambulatory Visit | Attending: Hematology | Admitting: Hematology

## 2022-05-02 DIAGNOSIS — E041 Nontoxic single thyroid nodule: Secondary | ICD-10-CM | POA: Diagnosis not present

## 2022-05-02 MED ORDER — TECHNETIUM TC 99M SESTAMIBI GENERIC - CARDIOLITE
25.0000 | Freq: Once | INTRAVENOUS | Status: AC | PRN
  Administered 2022-05-02: 23 via INTRAVENOUS

## 2022-05-08 ENCOUNTER — Ambulatory Visit (INDEPENDENT_AMBULATORY_CARE_PROVIDER_SITE_OTHER): Payer: Medicare Other

## 2022-05-08 ENCOUNTER — Ambulatory Visit (INDEPENDENT_AMBULATORY_CARE_PROVIDER_SITE_OTHER): Payer: Medicare Other | Admitting: Family Medicine

## 2022-05-08 ENCOUNTER — Encounter: Payer: Self-pay | Admitting: Family Medicine

## 2022-05-08 VITALS — BP 121/73 | HR 70 | Temp 97.3°F | Ht 67.0 in | Wt 261.2 lb

## 2022-05-08 DIAGNOSIS — K5900 Constipation, unspecified: Secondary | ICD-10-CM | POA: Diagnosis not present

## 2022-05-08 DIAGNOSIS — K581 Irritable bowel syndrome with constipation: Secondary | ICD-10-CM

## 2022-05-08 DIAGNOSIS — D0512 Intraductal carcinoma in situ of left breast: Secondary | ICD-10-CM | POA: Diagnosis not present

## 2022-05-08 DIAGNOSIS — R109 Unspecified abdominal pain: Secondary | ICD-10-CM | POA: Diagnosis not present

## 2022-05-08 DIAGNOSIS — I10 Essential (primary) hypertension: Secondary | ICD-10-CM

## 2022-05-08 DIAGNOSIS — E782 Mixed hyperlipidemia: Secondary | ICD-10-CM | POA: Diagnosis not present

## 2022-05-08 DIAGNOSIS — R1084 Generalized abdominal pain: Secondary | ICD-10-CM

## 2022-05-08 DIAGNOSIS — K219 Gastro-esophageal reflux disease without esophagitis: Secondary | ICD-10-CM | POA: Diagnosis not present

## 2022-05-08 MED ORDER — OMEPRAZOLE 40 MG PO CPDR: 40.0000 mg | DELAYED_RELEASE_CAPSULE | Freq: Every day | ORAL | 3 refills | Status: DC

## 2022-05-08 NOTE — Progress Notes (Signed)
Subjective:  Patient ID: Amber Diaz, female    DOB: 1956/01/10, 66 y.o.   MRN: [id number]  Patient Care Team: Baruch Gouty, FNP as PCP - General (Family Medicine) Derek Jack, MD as Medical Oncologist (Medical Oncology)   Chief Complaint:  Medical Management of Chronic Issues (6 month follow up ) and Constipation (Patient states it has been on and off for a few years but got worse the beginning of October. )   HPI: Amber Diaz is a 66 y.o. female presenting on 05/08/2022 for Medical Management of Chronic Issues (6 month follow up ) and Constipation (Patient states it has been on and off for a few years but got worse the beginning of October. )    1. Primary hypertension Medications were changed by oncology, diuretic was discontinued. She is still on her Benicar and tolerating well. Denies headaches, chest pain, palpitations, leg swelling, visual changes, weakness, or confusion.   2. Gastroesophageal reflux disease without esophagitis Doing well on current regimen but states somehow the dosing was changed to 20 mg and she has been doubling up on the pills for proper dosing. Doing well with the 40 mg daily dosing.   3. Generalized abdominal pain 4. Irritable bowel syndrome with constipation Ongoing symptoms for years. Intermittent constipation, worse since October. Feels this is medication related and has stopped some medications due to this. States symptoms improved for a few days and then returned. Reports infrequent bowel movements, hard with straining. She has tried MOM and stool softeners without relief of symptoms.   5. Hypercalcemia No tetany or spasms noted. Diuretic was stopped to see if this would be beneficial.   6. Mixed hyperlipidemia Took self off of statin therapy due to side effects. Does not follow a healthy diet or exercise routine.   7. Ductal carcinoma in situ (DCIS) of left breast Followed by oncology on a regular basis. Has appointment tomorrow.       Relevant past medical, surgical, family, and social history reviewed and updated as indicated.  Allergies and medications reviewed and updated. Data reviewed: Chart in Epic.   Past Medical History:  Diagnosis Date   Breast cancer ([facility])    HTN (hypertension)    Psoriasis     Past Surgical History:  Procedure Laterality Date   BREAST SURGERY     CESAREAN SECTION     EYE SURGERY     TUBAL LIGATION      Social History   Socioeconomic History   Marital status: Married    Spouse name: Not on file   Number of children: 1   Years of education: Not on file   Highest education level: 12th grade  Occupational History   Not on file  Tobacco Use   Smoking status: Former    Types: Cigarettes   Smokeless tobacco: Never  Vaping Use   Vaping Use: Never used  Substance and Sexual Activity   Alcohol use: Not Currently   Drug use: Never   Sexual activity: Not Currently  Other Topics Concern   Not on file  Social History Narrative   Not on file   Social Determinants of Health   Financial Resource Strain: Not on file  Food Insecurity: Not on file  Transportation Needs: Not on file  Physical Activity: Not on file  Stress: Not on file  Social Connections: Not on file  Intimate Partner Violence: Not on file    Outpatient Encounter Medications as of 05/08/2022  Medication Sig  betamethasone dipropionate 0.05 % cream Apply topically daily as needed.   letrozole (FEMARA) 2.5 MG tablet Take 1 tablet (2.5 mg total) by mouth daily.   olmesartan (BENICAR) 40 MG tablet Take 1 tablet (40 mg total) by mouth daily.   omeprazole (PRILOSEC) 40 MG capsule Take 1 capsule (40 mg total) by mouth daily.   [DISCONTINUED] omeprazole (PRILOSEC) 20 MG capsule Take 1 capsule (20 mg total) by mouth daily. (Patient taking differently: Take 40 mg by mouth daily.)   baclofen (LIORESAL) 10 MG tablet Take 1 tablet (10 mg total) by mouth 3 (three) times daily as needed. (Patient not taking: Reported on  03/25/2022)   montelukast (SINGULAIR) 10 MG tablet TAKE 1 TABLET BY MOUTH EVERY  EVENING (Patient not taking: Reported on 03/25/2022)   [DISCONTINUED] atorvastatin (LIPITOR) 20 MG tablet TAKE 1 TABLET BY MOUTH AT  BEDTIME (Patient not taking: Reported on 03/25/2022)   No facility-administered encounter medications on file as of 05/08/2022.    No Known Allergies  Review of Systems  Constitutional:  Positive for activity change, appetite change and fatigue. Negative for chills, diaphoresis, fever and unexpected weight change.  HENT: Negative.    Eyes: Negative.  Negative for photophobia and visual disturbance.  Respiratory:  Negative for cough, chest tightness and shortness of breath.   Cardiovascular:  Negative for chest pain, palpitations and leg swelling.  Gastrointestinal:  Positive for abdominal pain and constipation. Negative for abdominal distention, anal bleeding, blood in stool, diarrhea, nausea, rectal pain and vomiting.  Endocrine: Negative.  Negative for cold intolerance, heat intolerance, polydipsia, polyphagia and polyuria.  Genitourinary:  Negative for decreased urine volume, difficulty urinating, dysuria, frequency and urgency.  Musculoskeletal:  Positive for arthralgias, back pain and myalgias.  Skin: Negative.   Allergic/Immunologic: Negative.   Neurological:  Negative for dizziness, tremors, seizures, syncope, facial asymmetry, speech difficulty, weakness, light-headedness, numbness and headaches.  Hematological: Negative.   Psychiatric/Behavioral:  Negative for confusion, hallucinations, sleep disturbance and suicidal ideas.   All other systems reviewed and are negative.       Objective:  BP 121/73   Pulse 70   Temp (!) 97.3 F (36.3 C) (Temporal)   Ht _0  (1.702 m)   Wt 261 lb 3.2 oz (118.5 kg)   SpO2 95%   BMI 40.91 kg/m    Wt Readings from Last 3 Encounters:  05/08/22 261 lb 3.2 oz (118.5 kg)  03/25/22 264 lb (119.7 kg)  03/13/22 262 lb 9.6 oz (119.1  kg)    Physical Exam Vitals and nursing note reviewed.  Constitutional:      General: She is not in acute distress.    Appearance: Normal appearance. She is obese. She is not ill-appearing, toxic-appearing or diaphoretic.  HENT:     Head: Normocephalic and atraumatic.     Mouth/Throat:     Mouth: Mucous membranes are moist.  Eyes:     Pupils: Pupils are equal, round, and reactive to light.  Cardiovascular:     Rate and Rhythm: Normal rate and regular rhythm.     Heart sounds: Normal heart sounds.  Pulmonary:     Effort: Pulmonary effort is normal.     Breath sounds: Normal breath sounds.  Abdominal:     General: Abdomen is protuberant. Bowel sounds are normal.     Palpations: Abdomen is soft.     Tenderness: There is abdominal tenderness in the periumbilical area. There is no right CVA tenderness, left CVA tenderness, guarding or rebound. Negative signs include  Murphy's sign, Rovsing's sign, McBurney's sign, psoas sign and obturator sign.  Musculoskeletal:     Right lower leg: No edema.     Left lower leg: No edema.  Skin:    General: Skin is warm and dry.     Capillary Refill: Capillary refill takes less than 2 seconds.  Neurological:     General: No focal deficit present.     Mental Status: She is alert and oriented to person, place, and time.  Psychiatric:        Mood and Affect: Mood normal.        Behavior: Behavior normal.        Thought Content: Thought content normal.        Judgment: Judgment normal.     Results for orders placed or performed during the hospital encounter of 04/17/22  Cytology - Non PAP;  Result Value Ref Range   CYTOLOGY - NON GYN      CYTOLOGY - NON PAP CASE: [id number] PATIENT: Amber Diaz Non-Gynecological Cytology Report     Clinical History: Nontoxic multinodular goiter, 2.6 cm LLL: TR4 (4-6 pts) ACR TI-RADS risk category Specimen Submitted:  A. THYROID, LLL, FINE NEEDLE ASPIRATION:   FINAL MICROSCOPIC DIAGNOSIS: -  Consistent with benign follicular nodule (Bethesda category II)  SPECIMEN ADEQUACY: Satisfactory for evaluation  GROSS: Received is/are 6 slides in 95% Ethyl alcohol, and 30 ccs of pale pink Cytolyt solution. (CM:cm) Prepared: Smears:  6 Concentration Method (Thin Prep):  1 Cell Block:  Cell block attempted, not obtained. Additional Studies:  Also there was an Afirma collected.     Final Diagnosis performed by Tilford Pillar DO.   Electronically signed 04/19/2022 Technical component performed at [facility], [place] [address], [place], [place] [address].  Professional component performed at [facility], [place] [address] [place], [place] [address].  Immunohistochemistry Technical component (if applicable) was performed at [facility]. [address] [place], [place], [place] [address].   IMMUNOHISTOCHEMISTRY DISCLAIMER (if applicable): Some of these immunohistochemical stains may have been developed and the performance characteristics determine by [facility]. Some may not have been cleared or approved by the U.S. Food and Drug Administration. The FDA has determined that such clearance or approval is not necessary. This test is used for clinical purposes. It should not be regarded as investigational or for research. This laboratory is certified under the [place] (CLIA-88) as qualified to perform high complexity clinical laboratory testing.  The controls stained appropriately.      X-Ray: KUB: significant stool burden, no acute findings. Preliminary x-ray reading by Monia Pouch, FNP-C, [facility].   Pertinent labs & imaging results that were available during my care of the patient were reviewed by me and considered in my medical decision making.  Assessment & Plan:  [place] was seen today for medical management of chronic issues and constipation.  Diagnoses and all  orders for this visit:  Primary hypertension BP well controlled. Changes were not made in regimen today. Goal BP is 130/80. Pt aware to report any persistent high or low readings. DASH diet and exercise encouraged. Exercise at least 150 minutes per week and increase as tolerated. Goal BMI > 25. Stress management encouraged. Avoid nicotine and tobacco product use. Avoid excessive alcohol and NSAID's. Avoid more than 2000 mg of sodium daily. Medications as prescribed. Follow up as scheduled.  -     CBC with Differential/Platelet; Future -  CMP14+EGFR; Future -     Lipid panel; Future -     Thyroid Panel With TSH; Future  Gastroesophageal reflux disease without esophagitis No red flags present. Diet discussed. Avoid fried, spicy, fatty, greasy, and acidic foods. Avoid caffeine, nicotine, and alcohol. Do not eat 2-3 hours before bedtime and stay upright for at least 1-2 hours after eating. Eat small frequent meals. Avoid NSAID's like motrin and aleve. Medications as prescribed. Report any new or worsening symptoms. Follow up as discussed or sooner if needed.   -     omeprazole (PRILOSEC) 40 MG capsule; Take 1 capsule (40 mg total) by mouth daily. -     CBC with Differential/Platelet; Future  Generalized abdominal pain IBS with constipation  KUB with significant stool burden. Has tried and failed several over the counter medications. Reported symptoms with length of symptoms consistent with IBS constipation. Will trial Linzess. Samples with proper dosing discussed in detail. If beneficial, will send in to pharmacy. Report new, worsening, or persistent symptoms.  -     DG Abd 1 View -     CBC with Differential/Platelet; Future -     CMP14+EGFR; Future  Hypercalcemia Will check labs today. -     CMP14+EGFR; Future -     Parathyroid hormone, intact (no Ca); Future  Mixed hyperlipidemia Diet encouraged - increase intake of fresh fruits and vegetables, increase intake of lean proteins. Bake,  broil, or grill foods. Avoid fried, greasy, and fatty foods. Avoid fast foods. Increase intake of fiber-rich whole grains. Exercise encouraged - at least 150 minutes per week and advance as tolerated.  Goal BMI < 25. Continue medications as prescribed. Follow up in 3-6 months as discussed.  -     Lipid panel; Future  Ductal carcinoma in situ (DCIS) of left breast Followed by oncology on a regular basis.  -     CBC with Differential/Platelet; Future     Continue all other maintenance medications.  Follow up plan: Return in about 6 months (around 11/07/2022), or if symptoms worsen or fail to improve, for chronic follow up.   Continue healthy lifestyle choices, including diet (rich in fruits, vegetables, and lean proteins, and low in salt and simple carbohydrates) and exercise (at least 30 minutes of moderate physical activity daily).  Educational handout given for constipation  The above assessment and management plan was discussed with the patient. The patient verbalized understanding of and has agreed to the management plan. Patient is aware to call the clinic if they develop any new symptoms or if symptoms persist or worsen. Patient is aware when to return to the clinic for a follow-up visit. Patient educated on when it is appropriate to go to the emergency department.   Monia Pouch, FNP-C [facility] [phone]

## 2022-05-09 ENCOUNTER — Inpatient Hospital Stay: Payer: Medicare Other | Attending: Hematology | Admitting: Hematology

## 2022-05-09 DIAGNOSIS — D0512 Intraductal carcinoma in situ of left breast: Secondary | ICD-10-CM | POA: Diagnosis not present

## 2022-05-09 DIAGNOSIS — Z79810 Long term (current) use of selective estrogen receptor modulators (SERMs): Secondary | ICD-10-CM | POA: Insufficient documentation

## 2022-05-09 DIAGNOSIS — Z803 Family history of malignant neoplasm of breast: Secondary | ICD-10-CM | POA: Diagnosis not present

## 2022-05-09 DIAGNOSIS — Z809 Family history of malignant neoplasm, unspecified: Secondary | ICD-10-CM | POA: Diagnosis not present

## 2022-05-09 DIAGNOSIS — Z87891 Personal history of nicotine dependence: Secondary | ICD-10-CM | POA: Insufficient documentation

## 2022-05-09 DIAGNOSIS — Z923 Personal history of irradiation: Secondary | ICD-10-CM | POA: Insufficient documentation

## 2022-05-09 MED ORDER — TAMOXIFEN CITRATE 20 MG PO TABS: 20.0000 mg | ORAL_TABLET | Freq: Every day | ORAL | 3 refills | Status: DC

## 2022-05-09 NOTE — Patient Instructions (Addendum)
[place] at [facility] Discharge Instructions   You were seen and examined today by Dr. Delton Coombes.  He reviewed the results of your lab work which are normal/stable. Your calcium remains high.  We will make a referral.   Return as scheduled in April.    Thank you for choosing [place] at [facility] to provide your oncology and hematology care.  To afford each patient quality time with our provider, please arrive at least 15 minutes before your scheduled appointment time.   If you have a lab appointment with the [facility] please come in thru the Main Entrance and check in at the main information desk.  You need to re-schedule your appointment should you arrive 10 or more minutes late.  We strive to give you quality time with our providers, and arriving late affects you and other patients whose appointments are after yours.  Also, if you no show three or more times for appointments you may be dismissed from the clinic at the providers discretion.     Again, thank you for choosing [facility].  Our hope is that these requests will decrease the amount of time that you wait before being seen by our physicians.       _____________________________________________________________  Should you have questions after your visit to [facility], please contact our office at [phone] and follow the prompts.  Our office hours are 8:00 a.m. and 4:30 p.m. Monday - Friday.  Please note that voicemails left after 4:00 p.m. may not be returned until the following business day.  We are closed weekends and major holidays.  You do have access to a nurse 24-7, just call the main number to the clinic [phone] and do not press any options, hold on the line and a nurse will answer the phone.    For prescription refill requests, have your pharmacy contact our office and allow 72 hours.    Due to Covid, you will need to wear a mask  upon entering the hospital. If you do not have a mask, a mask will be given to you at the Main Entrance upon arrival. For doctor visits, patients may have 1 support person age 37 or older with them. For treatment visits, patients can not have anyone with them due to social distancing guidelines and our immunocompromised population.

## 2022-05-10 NOTE — Progress Notes (Signed)
[facility] [address], [place] [address]   Patient Care Team: Rakes, Connye Burkitt, Cotton as PCP - General (Family Medicine) Derek Jack, MD as Medical Oncologist (Medical Oncology)  SUMMARY OF ONCOLOGIC HISTORY: Oncology History  Ductal carcinoma in situ (DCIS) of left breast  07/06/2021 Initial Diagnosis   Ductal carcinoma in situ (DCIS) of left breast   07/17/2021 Cancer Staging   Staging form: Breast, AJCC 8th Edition - Clinical stage from 07/17/2021: Stage 0 (cTis (DCIS), cN0, cM0, G3, ER+, PR+, HER2: Not Assessed) - Signed by Derek Jack, MD on 07/17/2021 Stage prefix: Initial diagnosis Nuclear grade: G3 Histologic grading system: 3 grade system     CHIEF COMPLIANT: Follow-up of left breast DCIS   INTERVAL HISTORY: [name] is a 66 y.o. female seen for follow-up of left breast DCIS.  She is taking letrozole daily but complains of worsening of her arthralgias.  She also reports worsening of hot flashes.  REVIEW OF SYSTEMS:   Review of Systems  Constitutional:  Negative for appetite change.  Respiratory:  Positive for shortness of breath.   Endocrine: Positive for hot flashes.  Musculoskeletal:  Positive for arthralgias (6/10).  Neurological:  Positive for dizziness and headaches.  Psychiatric/Behavioral:  Positive for sleep disturbance.   All other systems reviewed and are negative.   I have reviewed the past medical history, past surgical history, social history and family history with the patient and they are unchanged from previous note.   ALLERGIES:   has No Known Allergies.   MEDICATIONS:  Current Outpatient Medications  Medication Sig Dispense Refill   baclofen (LIORESAL) 10 MG tablet Take 1 tablet (10 mg total) by mouth 3 (three) times daily as needed. 30 each 0   betamethasone dipropionate 0.05 % cream Apply topically daily as needed.     montelukast (SINGULAIR) 10 MG tablet TAKE 1 TABLET BY MOUTH EVERY  EVENING  100 tablet 0   olmesartan (BENICAR) 40 MG tablet Take 1 tablet (40 mg total) by mouth daily. 30 tablet 6   omeprazole (PRILOSEC) 40 MG capsule Take 1 capsule (40 mg total) by mouth daily. 90 capsule 3   tamoxifen (NOLVADEX) 20 MG tablet Take 1 tablet (20 mg total) by mouth daily. 90 tablet 3   No current facility-administered medications for this visit.     PHYSICAL EXAMINATION: Performance status (ECOG): 1 - Symptomatic but completely ambulatory  Vitals:   05/09/22 1101  BP: 125/72  Pulse: 76  Resp: 16  Temp: 97.9 F (36.6 C)  SpO2: 97%   Wt Readings from Last 3 Encounters:  05/09/22 261 lb 14.4 oz (118.8 kg)  05/08/22 261 lb 3.2 oz (118.5 kg)  03/25/22 264 lb (119.7 kg)   Physical Exam Vitals reviewed.  Constitutional:      Appearance: Normal appearance.  Cardiovascular:     Rate and Rhythm: Normal rate and regular rhythm.     Heart sounds: Normal heart sounds.  Pulmonary:     Breath sounds: Normal breath sounds.  Neurological:     Mental Status: She is alert.  Psychiatric:        Mood and Affect: Mood normal.        Behavior: Behavior normal.   Left breast lumpectomy scar is within normal limits.  No palpable masses in bilateral breasts.  No palpable adenopathy.  Breast Exam Chaperone: Chasity Edwards   LABORATORY DATA:  I have reviewed the data as listed    Latest Ref Rng &  Units 03/07/2022    2:01 PM 11/06/2021   11:21 AM 10/09/2021   10:51 AM  CMP  Glucose 70 - 99 mg/dL 91  95  106   BUN 8 - 23 mg/dL _0 Creatinine 0.44 - 1.00 mg/dL 0.90  0.87  0.98   Sodium 135 - 145 mmol/L 139  139  140   Potassium 3.5 - 5.1 mmol/L 4.1  4.5  4.1   Chloride 98 - 111 mmol/L 108  102  109   CO2 22 - 32 mmol/L _1 Calcium 8.9 - 10.3 mg/dL 11.4  11.6  11.5    11.7   Total Protein 6.5 - 8.1 g/dL 7.4  7.1  7.8   Total Bilirubin 0.3 - 1.2 mg/dL 1.2  0.3  0.8   Alkaline Phos 38 - 126 U/L 119  123  99   AST 15 - 41 U/L _2 ALT 0 - 44 U/L _3 No results found for: "CAN153" Lab Results  Component Value Date   WBC 9.4 03/07/2022   HGB 13.3 03/07/2022   HCT 41.5 03/07/2022   MCV 89.1 03/07/2022   PLT 302 03/07/2022   NEUTROABS 6.3 03/07/2022    ASSESSMENT:  Left breast high-grade DCIS, ER positive: - Screening mammogram on 08/12/2020 with asymmetry in the outer left breast in [place], [place]. - Diagnostic mammogram/ultrasound and US biopsy on 09/05/2020, pathology consistent with high-grade DCIS, ER/PR positive. - Lumpectomy and SLN biopsy on 10/03/2020 - Pathology with 1.4 cm DCIS, margins negative, 4/4 lymph nodes negative. - Status post XRT to the left breast - She was reportedly negative for genetic testing.  She had previous right breast biopsies x4 (1996, 2006, 2021). - She was started on anastrozole, developed nausea, vomiting and abdominal pain - Exemestane started on 01/10/2021, discontinued in the first week of February 2023 secondary to hypertension. - Letrozole started on 07/17/2021. - She has some hot flashes which started after taking anastrozole but never went away.  They are tolerable. - She also has joint pains in the ankles, knees, hips, shoulders and wrists which are chronic and stable.    Social/family history: - She is retired Glass blower/designer.  Recently moved from [place] to [place]. - She smoked 2 packs/day from age 55 through 89 and quit. - Mother had breast cancer at age 43 and 35.  Maternal grandmother had breast cancer at age 17.  Father had cancer on cervical spine.  Primary unknown.   PLAN:  Left breast DCIS, high-grade, ER/PR positive: -She is taking letrozole.  However she reports worsening arthralgias and hot flashes. - Last mammogram on 08/21/2021, BI-RADS 2. - I will discontinue letrozole. - I will start her on tamoxifen 20 mg daily.  We discussed side effects in detail. - She will come back in April after the mammogram.  2.  Osteopenia (DEXA July 2022 T score  -1.4): - She is not taking calcium and vitamin D supplements due to hypercalcemia.  3.  Mild hypercalcemia: - Previous workup showed normal vitamin D and 25-hydroxy vitamin D, SPEP, immunofixation, free light chains.  PTH RP was normal.  Intact PTH was 46. - Ultrasound neck (03/21/2022): No parathyroid adenoma.  2.6 cm exophytic ill-defined nodule in the inferior pole of the lower lobe of thyroid. - Thyroid biopsy was Bethesda category 2 with benign follicular nodules. - Nuclear medicine  parathyroid scan (05/02/2022): Subtle areas of tracer retention at the inferior pole of the left lobe as well as upper poles of both lobes without definite dominant mass/adenoma identified.  Pattern is nonspecific and could potentially represent parathyroid hyperplasia. - Calcium remains high between 11 and 12.  I will reach out to Dr. Harlow Asa to see if she is surgical candidate.  If not we will make a referral to Dr. Renne Crigler.   Breast Cancer therapy associated bone loss: I have recommended calcium, Vitamin D and weight bearing exercises.  Orders placed this encounter:  No orders of the defined types were placed in this encounter.    The patient has a good understanding of the overall plan. She agrees with it. She will call with any problems that may develop before the next visit here.  Derek Jack, MD [place] [phone]

## 2022-05-21 ENCOUNTER — Other Ambulatory Visit: Payer: Self-pay | Admitting: *Deleted

## 2022-05-21 DIAGNOSIS — D0512 Intraductal carcinoma in situ of left breast: Secondary | ICD-10-CM

## 2022-05-28 ENCOUNTER — Encounter: Payer: Self-pay | Admitting: Family Medicine

## 2022-05-28 MED ORDER — LINACLOTIDE 145 MCG PO CAPS: 145.0000 ug | ORAL_CAPSULE | Freq: Every day | ORAL | 6 refills | Status: DC

## 2022-05-28 NOTE — Addendum Note (Signed)
Addended by: Baruch Gouty on: 05/28/2022 02:42 PM   Modules accepted: Orders

## 2022-05-30 ENCOUNTER — Other Ambulatory Visit: Payer: Medicare Other

## 2022-05-30 DIAGNOSIS — K219 Gastro-esophageal reflux disease without esophagitis: Secondary | ICD-10-CM

## 2022-05-30 DIAGNOSIS — E782 Mixed hyperlipidemia: Secondary | ICD-10-CM

## 2022-05-30 DIAGNOSIS — R1084 Generalized abdominal pain: Secondary | ICD-10-CM

## 2022-05-30 DIAGNOSIS — I10 Essential (primary) hypertension: Secondary | ICD-10-CM | POA: Diagnosis not present

## 2022-05-30 DIAGNOSIS — D0512 Intraductal carcinoma in situ of left breast: Secondary | ICD-10-CM

## 2022-05-30 LAB — LIPID PANEL

## 2022-05-31 LAB — CBC WITH DIFFERENTIAL/PLATELET
Basophils Absolute: 0.1 10*3/uL (ref 0.0–0.2)
Basos: 1 %
EOS (ABSOLUTE): 0.2 10*3/uL (ref 0.0–0.4)
Eos: 3 %
Hematocrit: 40.6 % (ref 34.0–46.6)
Hemoglobin: 12.9 g/dL (ref 11.1–15.9)
Immature Grans (Abs): 0 10*3/uL (ref 0.0–0.1)
Immature Granulocytes: 0 %
Lymphocytes Absolute: 1.8 10*3/uL (ref 0.7–3.1)
Lymphs: 25 %
MCH: 27.1 pg (ref 26.6–33.0)
MCHC: 31.8 g/dL (ref 31.5–35.7)
MCV: 85 fL (ref 79–97)
Monocytes Absolute: 0.4 10*3/uL (ref 0.1–0.9)
Monocytes: 5 %
Neutrophils Absolute: 4.8 10*3/uL (ref 1.4–7.0)
Neutrophils: 66 %
Platelets: 309 10*3/uL (ref 150–450)
RBC: 4.76 x10E6/uL (ref 3.77–5.28)
RDW: 12.7 % (ref 11.7–15.4)
WBC: 7.4 10*3/uL (ref 3.4–10.8)

## 2022-05-31 LAB — CMP14+EGFR
ALT: 22 IU/L (ref 0–32)
AST: 17 IU/L (ref 0–40)
Albumin/Globulin Ratio: 1.4 (ref 1.2–2.2)
Albumin: 4.2 g/dL (ref 3.9–4.9)
Alkaline Phosphatase: 125 IU/L — ABNORMAL HIGH (ref 44–121)
BUN/Creatinine Ratio: 19 (ref 12–28)
BUN: 18 mg/dL (ref 8–27)
Bilirubin Total: 0.6 mg/dL (ref 0.0–1.2)
CO2: 21 mmol/L (ref 20–29)
Calcium: 11.8 mg/dL — ABNORMAL HIGH (ref 8.7–10.3)
Chloride: 103 mmol/L (ref 96–106)
Creatinine, Ser: 0.97 mg/dL (ref 0.57–1.00)
Globulin, Total: 3 g/dL (ref 1.5–4.5)
Glucose: 99 mg/dL (ref 70–99)
Potassium: 4.7 mmol/L (ref 3.5–5.2)
Sodium: 142 mmol/L (ref 134–144)
Total Protein: 7.2 g/dL (ref 6.0–8.5)
eGFR: 64 mL/min/{1.73_m2} (ref >59)

## 2022-05-31 LAB — THYROID PANEL WITH TSH
Free Thyroxine Index: 1.9 (ref 1.2–4.9)
T3 Uptake Ratio: 28 % (ref 24–39)
T4, Total: 6.7 ug/dL (ref 4.5–12.0)
TSH: 1.13 u[IU]/mL (ref 0.450–4.500)

## 2022-05-31 LAB — LIPID PANEL
Chol/HDL Ratio: 4.6 ratio — ABNORMAL HIGH (ref 0.0–4.4)
Cholesterol, Total: 151 mg/dL (ref 100–199)
HDL: 33 mg/dL — ABNORMAL LOW (ref >39)
LDL Chol Calc (NIH): 91 mg/dL (ref 0–99)
Triglycerides: 155 mg/dL — ABNORMAL HIGH (ref 0–149)
VLDL Cholesterol Cal: 27 mg/dL (ref 5–40)

## 2022-05-31 LAB — PARATHYROID HORMONE, INTACT (NO CA): PTH: 78 pg/mL — ABNORMAL HIGH (ref 15–65)

## 2022-06-08 ENCOUNTER — Other Ambulatory Visit: Payer: Self-pay | Admitting: Family Medicine

## 2022-06-08 ENCOUNTER — Other Ambulatory Visit: Payer: Self-pay | Admitting: Hematology

## 2022-06-08 DIAGNOSIS — E782 Mixed hyperlipidemia: Secondary | ICD-10-CM

## 2022-06-08 DIAGNOSIS — Z9109 Other allergy status, other than to drugs and biological substances: Secondary | ICD-10-CM

## 2022-06-11 DIAGNOSIS — E210 Primary hyperparathyroidism: Secondary | ICD-10-CM | POA: Diagnosis not present

## 2022-06-13 ENCOUNTER — Other Ambulatory Visit: Payer: Self-pay | Admitting: Surgery

## 2022-06-13 DIAGNOSIS — E210 Primary hyperparathyroidism: Secondary | ICD-10-CM

## 2022-06-26 ENCOUNTER — Other Ambulatory Visit: Payer: Self-pay | Admitting: Family Medicine

## 2022-06-26 DIAGNOSIS — E782 Mixed hyperlipidemia: Secondary | ICD-10-CM

## 2022-07-10 ENCOUNTER — Ambulatory Visit (INDEPENDENT_AMBULATORY_CARE_PROVIDER_SITE_OTHER): Payer: Medicare Other

## 2022-07-10 VITALS — Ht 68.0 in | Wt 255.0 lb

## 2022-07-10 DIAGNOSIS — Z0000 Encounter for general adult medical examination without abnormal findings: Secondary | ICD-10-CM

## 2022-07-10 NOTE — Patient Instructions (Signed)
[name] , Thank you for taking time to come for your Medicare Wellness Visit. I appreciate your ongoing commitment to your health goals. Please review the following plan we discussed and let me know if I can assist you in the future.   These are the goals we discussed:  Goals      AWV     07/09/2021 AWV Goal: Exercise for General Health  Patient will verbalize understanding of the benefits of increased physical activity: Exercising regularly is important. It will improve your overall fitness, flexibility, and endurance. Regular exercise also will improve your overall health. It can help you control your weight, reduce stress, and improve your bone density. Over the next year, patient will increase physical activity as tolerated with a goal of at least 150 minutes of moderate physical activity per week.  You can tell that you are exercising at a moderate intensity if your heart starts beating faster and you start breathing faster but can still hold a conversation. Moderate-intensity exercise ideas include: Walking 1 mile (1.6 km) in about 15 minutes Biking Hiking Golfing Dancing Water aerobics Patient will verbalize understanding of everyday activities that increase physical activity by providing examples like the following: Yard work, such as: Sales promotion account executive Gardening Washing windows or floors Patient will be able to explain general safety guidelines for exercising:  Before you start a new exercise program, talk with your health care provider. Do not exercise so much that you hurt yourself, feel dizzy, or get very short of breath. Wear comfortable clothes and wear shoes with good support. Drink plenty of water while you exercise to prevent dehydration or heat stroke. Work out until your breathing and your heartbeat get faster.      DIET - INCREASE WATER INTAKE        This is a list of the  screening recommended for you and due dates:  Health Maintenance  Topic Date Due   Hepatitis C Screening: USPSTF Recommendation to screen - Ages 26-79 yo.  Never done   DTaP/Tdap/Td vaccine (1 - Tdap) Never done   Screening for Lung Cancer  Never done   COVID-19 Vaccine (2 - Pfizer risk series) 02/04/2020   Pneumonia Vaccine (1 of 1 - PCV) Never done   Zoster (Shingles) Vaccine (1 of 2) 08/07/2022*   Flu Shot  08/25/2022*   Medicare Annual Wellness Visit  07/11/2023   Mammogram  08/22/2023   Cologuard (Stool DNA test)  09/10/2024   DEXA scan (bone density measurement)  Completed   HPV Vaccine  Aged Out  *Topic was postponed. The date shown is not the original due date.    Advanced directives: Advance directive discussed with you today. I have provided a copy for you to complete at home and have notarized. Once this is complete please bring a copy in to our office so we can scan it into your chart.   Conditions/risks identified: Aim for 30 minutes of exercise or brisk walking, 6-8 glasses of water, and 5 servings of fruits and vegetables each day.   Next appointment: Follow up in one year for your annual wellness visit    Preventive Care 65 Years and Older, Female Preventive care refers to lifestyle choices and visits with your health care provider that can promote health and wellness. What does preventive care include? A yearly physical exam. This is also called an annual well check. Dental exams once or twice  a year. Routine eye exams. Ask your health care provider how often you should have your eyes checked. Personal lifestyle choices, including: Daily care of your teeth and gums. Regular physical activity. Eating a healthy diet. Avoiding tobacco and drug use. Limiting alcohol use. Practicing safe sex. Taking low-dose aspirin every day. Taking vitamin and mineral supplements as recommended by your health care provider. What happens during an annual well check? The services  and screenings done by your health care provider during your annual well check will depend on your age, overall health, lifestyle risk factors, and family history of disease. Counseling  Your health care provider may ask you questions about your: Alcohol use. Tobacco use. Drug use. Emotional well-being. Home and relationship well-being. Sexual activity. Eating habits. History of falls. Memory and ability to understand (cognition). Work and work Statistician. Reproductive health. Screening  You may have the following tests or measurements: Height, weight, and BMI. Blood pressure. Lipid and cholesterol levels. These may be checked every 5 years, or more frequently if you are over 28 years old. Skin check. Lung cancer screening. You may have this screening every year starting at age 20 if you have a 30-pack-year history of smoking and currently smoke or have quit within the past 15 years. Fecal occult blood test (FOBT) of the stool. You may have this test every year starting at age 67. Flexible sigmoidoscopy or colonoscopy. You may have a sigmoidoscopy every 5 years or a colonoscopy every 10 years starting at age 67. Hepatitis C blood test. Hepatitis B blood test. Sexually transmitted disease (STD) testing. Diabetes screening. This is done by checking your blood sugar (glucose) after you have not eaten for a while (fasting). You may have this done every 1-3 years. Bone density scan. This is done to screen for osteoporosis. You may have this done starting at age 27. Mammogram. This may be done every 1-2 years. Talk to your health care provider about how often you should have regular mammograms. Talk with your health care provider about your test results, treatment options, and if necessary, the need for more tests. Vaccines  Your health care provider may recommend certain vaccines, such as: Influenza vaccine. This is recommended every year. Tetanus, diphtheria, and acellular pertussis  (Tdap, Td) vaccine. You may need a Td booster every 10 years. Zoster vaccine. You may need this after age 39. Pneumococcal 13-valent conjugate (PCV13) vaccine. One dose is recommended after age 55. Pneumococcal polysaccharide (PPSV23) vaccine. One dose is recommended after age 18. Talk to your health care provider about which screenings and vaccines you need and how often you need them. This information is not intended to replace advice given to you by your health care provider. Make sure you discuss any questions you have with your health care provider. Document Released: 06/09/2015 Document Revised: 01/31/2016 Document Reviewed: 03/14/2015 Elsevier Interactive Patient Education  2017 [place] Prevention in the Home Falls can cause injuries. They can happen to people of all ages. There are many things you can do to make your home safe and to help prevent falls. What can I do on the outside of my home? Regularly fix the edges of walkways and driveways and fix any cracks. Remove anything that might make you trip as you walk through a door, such as a raised step or threshold. Trim any bushes or trees on the path to your home. Use bright outdoor lighting. Clear any walking paths of anything that might make someone trip, such as rocks  or tools. Regularly check to see if handrails are loose or broken. Make sure that both sides of any steps have handrails. Any raised decks and porches should have guardrails on the edges. Have any leaves, snow, or ice cleared regularly. Use sand or salt on walking paths during winter. Clean up any spills in your garage right away. This includes oil or grease spills. What can I do in the bathroom? Use night lights. Install grab bars by the toilet and in the tub and shower. Do not use towel bars as grab bars. Use non-skid mats or decals in the tub or shower. If you need to sit down in the shower, use a plastic, non-slip stool. Keep the floor dry. Clean  up any water that spills on the floor as soon as it happens. Remove soap buildup in the tub or shower regularly. Attach bath mats securely with double-sided non-slip rug tape. Do not have throw rugs and other things on the floor that can make you trip. What can I do in the bedroom? Use night lights. Make sure that you have a light by your bed that is easy to reach. Do not use any sheets or blankets that are too big for your bed. They should not hang down onto the floor. Have a firm chair that has side arms. You can use this for support while you get dressed. Do not have throw rugs and other things on the floor that can make you trip. What can I do in the kitchen? Clean up any spills right away. Avoid walking on wet floors. Keep items that you use a lot in easy-to-reach places. If you need to reach something above you, use a strong step stool that has a grab bar. Keep electrical cords out of the way. Do not use floor polish or wax that makes floors slippery. If you must use wax, use non-skid floor wax. Do not have throw rugs and other things on the floor that can make you trip. What can I do with my stairs? Do not leave any items on the stairs. Make sure that there are handrails on both sides of the stairs and use them. Fix handrails that are broken or loose. Make sure that handrails are as long as the stairways. Check any carpeting to make sure that it is firmly attached to the stairs. Fix any carpet that is loose or worn. Avoid having throw rugs at the top or bottom of the stairs. If you do have throw rugs, attach them to the floor with carpet tape. Make sure that you have a light switch at the top of the stairs and the bottom of the stairs. If you do not have them, ask someone to add them for you. What else can I do to help prevent falls? Wear shoes that: Do not have high heels. Have rubber bottoms. Are comfortable and fit you well. Are closed at the toe. Do not wear sandals. If you  use a stepladder: Make sure that it is fully opened. Do not climb a closed stepladder. Make sure that both sides of the stepladder are locked into place. Ask someone to hold it for you, if possible. Clearly mark and make sure that you can see: Any grab bars or handrails. First and last steps. Where the edge of each step is. Use tools that help you move around (mobility aids) if they are needed. These include: Canes. Walkers. Scooters. Crutches. Turn on the lights when you go into a dark area.  Replace any light bulbs as soon as they burn out. Set up your furniture so you have a clear path. Avoid moving your furniture around. If any of your floors are uneven, fix them. If there are any pets around you, be aware of where they are. Review your medicines with your doctor. Some medicines can make you feel dizzy. This can increase your chance of falling. Ask your doctor what other things that you can do to help prevent falls. This information is not intended to replace advice given to you by your health care provider. Make sure you discuss any questions you have with your health care provider. Document Released: 03/09/2009 Document Revised: 10/19/2015 Document Reviewed: 06/17/2014 Elsevier Interactive Patient Education  2017 [employer].

## 2022-07-10 NOTE — Progress Notes (Signed)
Subjective:   Amber Diaz is a 67 y.o. female who presents for Medicare Annual (Subsequent) preventive examination. I connected with  Amber Diaz on 07/10/22 by a audio enabled telemedicine application and verified that I am speaking with the correct person using two identifiers.  Patient Location: Home  Provider Location: Home Office  I discussed the limitations of evaluation and management by telemedicine. The patient expressed understanding and agreed to proceed.  Review of Systems     Cardiac Risk Factors include: advanced age (>51mn, >>70women);hypertension     Objective:    Today's Vitals   07/10/22 1412  Weight: 255 lb (115.7 kg)  Height: 5' 8"$  (1.727 m)   Body mass index is 38.77 kg/m.     07/10/2022    2:15 PM 05/09/2022   11:00 AM 03/13/2022    3:30 PM 11/07/2021   10:29 AM 10/16/2021    3:14 PM 07/17/2021    8:32 AM 07/09/2021    1:57 PM  Advanced Directives  Does Patient Have a Medical Advance Directive? Yes No No Yes Yes Yes Yes  Type of AParamed[place]of [place] will   Living will;Healthcare Power of ALos IndiosLiving will HPa[place]Living will [place]Living will  Does patient want to make changes to medical advance directive?       No - Patient declined  Copy of [place]? No - copy requested   No - copy requested No - copy requested No - copy requested No - copy requested  Would patient like information on creating a medical advance directive?  No - Patient declined No - Patient declined        Current Medications (verified) Outpatient Encounter Medications as of 07/10/2022  Medication Sig   baclofen (LIORESAL) 10 MG tablet Take 1 tablet (10 mg total) by mouth 3 (three) times daily as needed.   betamethasone dipropionate 0.05 % cream Apply topically daily as needed.   linaclotide (LINZESS) 145 MCG CAPS capsule Take 1 capsule (145 mcg  total) by mouth daily before breakfast.   montelukast (SINGULAIR) 10 MG tablet TAKE 1 TABLET BY MOUTH IN THE  EVENING   olmesartan (BENICAR) 40 MG tablet TAKE 1 TABLET BY MOUTH DAILY   omeprazole (PRILOSEC) 40 MG capsule Take 1 capsule (40 mg total) by mouth daily.   tamoxifen (NOLVADEX) 20 MG tablet Take 1 tablet (20 mg total) by mouth daily.   No facility-administered encounter medications on file as of 07/10/2022.    Allergies (verified) Patient has no known allergies.   History: Past Medical History:  Diagnosis Date   Breast cancer ([place]    HTN (hypertension)    Psoriasis    Past Surgical History:  Procedure Laterality Date   BREAST SURGERY     CESAREAN SECTION     EYE SURGERY     TUBAL LIGATION     Family History  Problem Relation Age of Onset   Breast cancer Mother    Heart attack Mother    Bone cancer Father    Breast cancer Maternal Grandmother    Obesity Son    Social History   Socioeconomic History   Marital status: Married    Spouse name: Not on file   Number of children: 1   Years of education: Not on file   Highest education level: 12th grade  Occupational History   Not on file  Tobacco Use   Smoking status: Former  Types: Cigarettes   Smokeless tobacco: Never  Vaping Use   Vaping Use: Never used  Substance and Sexual Activity   Alcohol use: Not Currently   Drug use: Never   Sexual activity: Not Currently  Other Topics Concern   Not on file  Social History Narrative   Not on file   Social Determinants of Health   Financial Resource Strain: Low Risk  (07/10/2022)   Overall Financial Resource Strain (CARDIA)    Difficulty of Paying Living Expenses: Not hard at all  Food Insecurity: No Food Insecurity (07/10/2022)   Hunger Vital Sign    Worried About Running Out of Food in the Last Year: Never true    Ran Out of Food in the Last Year: Never true  Transportation Needs: No Transportation Needs (07/10/2022)   PRAPARE - Civil engineer, contracting (Medical): No    Lack of Transportation (Non-Medical): No  Physical Activity: Inactive (07/10/2022)   Exercise Vital Sign    Days of Exercise per Week: 0 days    Minutes of Exercise per Session: 0 min  Stress: No Stress Concern Present (07/10/2022)   [place]    Feeling of Stress : Not at all  Social Connections: Moderately Isolated (07/10/2022)   Social Connection and Isolation Panel [NHANES]    Frequency of Communication with Friends and Family: More than three times a week    Frequency of Social Gatherings with Friends and Family: More than three times a week    Attends Religious Services: Never    Marine scientist or Organizations: No    Attends Music therapist: Never    Marital Status: Married    Tobacco Counseling Counseling given: Not Answered   Clinical Intake:  Pre-visit preparation completed: Yes  Pain : No/denies pain     Nutritional Risks: None Diabetes: No  How often do you need to have someone help you when you read instructions, pamphlets, or other written materials from your doctor or pharmacy?: 1 - Never  Diabetic?no   Interpreter Needed?: No  Information entered by :: Jadene Pierini, LPN   Activities of Daily Living    07/10/2022    2:15 PM 07/06/2022    4:33 PM  In your present state of health, do you have any difficulty performing the following activities:  Hearing? 0 0  Vision? 0 0  Difficulty concentrating or making decisions? 0 0  Walking or climbing stairs? 0 1  Dressing or bathing? 0 0  Doing errands, shopping? 0 0  Preparing Food and eating ? N N  Using the Toilet? N N  In the past six months, have you accidently leaked urine? N Y  Do you have problems with loss of bowel control? N N  Managing your Medications? N N  Managing your Finances? N N  Housekeeping or managing your Housekeeping? N N    Patient Care Team: Baruch Gouty,  FNP as PCP - General (Family Medicine) Derek Jack, MD as Medical Oncologist (Medical Oncology)  Indicate any recent Medical Services you may have received from other than [facility] providers in the past year (date may be approximate).     Assessment:   This is a routine wellness examination for Amber Diaz.  Hearing/Vision screen Vision Screening - Comments:: Wears rx glasses - up to date with routine eye exams with  Dr.Lee  Dietary issues and exercise activities discussed: Current Exercise Habits: The patient does not  participate in regular exercise at present, Exercise limited by: None identified   Goals Addressed             This Visit's Progress    DIET - INCREASE WATER INTAKE         Depression Screen    07/10/2022    2:14 PM 05/08/2022   11:19 AM 11/06/2021   10:57 AM 08/03/2021   10:31 AM 07/09/2021    2:01 PM  PHQ 2/9 Scores  PHQ - 2 Score 0 0 0 0 0  PHQ- 9 Score  6 5 0     Fall Risk    07/10/2022    2:13 PM 07/06/2022    4:33 PM 05/08/2022   11:19 AM 11/06/2021   10:59 AM 08/03/2021   10:31 AM  Fall Risk   Falls in the past year? 0 0 0 0 0  Number falls in past yr: 0 0     Injury with Fall? 0 0     Risk for fall due to : No Fall Risks      Follow up Falls prevention discussed    Falls evaluation completed    FALL RISK PREVENTION PERTAINING TO THE HOME:  Any stairs in or around the home? Yes  If so, are there any without handrails? No  Home free of loose throw rugs in walkways, pet beds, electrical cords, etc? Yes  Adequate lighting in your home to reduce risk of falls? Yes   ASSISTIVE DEVICES UTILIZED TO PREVENT FALLS:  Life alert? No  Use of a cane, walker or w/c? Yes  Grab bars in the bathroom? Yes  Shower chair or bench in shower? Yes  Elevated toilet seat or a handicapped toilet? No          07/10/2022    2:16 PM 07/09/2021    2:01 PM  6CIT Screen  What Year? 0 points 0 points  What month? 0 points 0 points  What time? 0 points 0 points   Count back from 20 0 points 0 points  Months in reverse 0 points 0 points  Repeat phrase 0 points 0 points  Total Score 0 points 0 points    Immunizations Immunization History  Administered Date(s) Administered   PFIZER(Purple Top)SARS-COV-2 Vaccination 01/14/2020    TDAP status: Due, Education has been provided regarding the importance of this vaccine. Advised may receive this vaccine at local pharmacy or Health Dept. Aware to provide a copy of the vaccination record if obtained from local pharmacy or Health Dept. Verbalized acceptance and understanding.  Flu Vaccine status: Due, Education has been provided regarding the importance of this vaccine. Advised may receive this vaccine at local pharmacy or Health Dept. Aware to provide a copy of the vaccination record if obtained from local pharmacy or Health Dept. Verbalized acceptance and understanding.  Pneumococcal vaccine status: Due, Education has been provided regarding the importance of this vaccine. Advised may receive this vaccine at local pharmacy or Health Dept. Aware to provide a copy of the vaccination record if obtained from local pharmacy or Health Dept. Verbalized acceptance and understanding.  Covid-19 vaccine status: Declined, Education has been provided regarding the importance of this vaccine but patient still declined. Advised may receive this vaccine at local pharmacy or Health Dept.or vaccine clinic. Aware to provide a copy of the vaccination record if obtained from local pharmacy or Health Dept. Verbalized acceptance and understanding.  Qualifies for Shingles Vaccine? Yes   Zostavax completed No   Shingrix Completed?:  No.    Education has been provided regarding the importance of this vaccine. Patient has been advised to call insurance company to determine out of pocket expense if they have not yet received this vaccine. Advised may also receive vaccine at local pharmacy or Health Dept. Verbalized acceptance and  understanding.  Screening Tests Health Maintenance  Topic Date Due   Hepatitis C Screening  Never done   DTaP/Tdap/Td (1 - Tdap) Never done   Lung Cancer Screening  Never done   COVID-19 Vaccine (2 - Pfizer risk series) 02/04/2020   Pneumonia Vaccine 42+ Years old (1 of 1 - PCV) Never done   Zoster Vaccines- Shingrix (1 of 2) 08/07/2022 (Originally 03/04/1975)   INFLUENZA VACCINE  08/25/2022 (Originally 12/25/2021)   Medicare Annual Wellness (AWV)  07/11/2023   MAMMOGRAM  08/22/2023   Fecal DNA (Cologuard)  09/10/2024   DEXA SCAN  Completed   HPV VACCINES  Aged Out    Health Maintenance  Health Maintenance Due  Topic Date Due   Hepatitis C Screening  Never done   DTaP/Tdap/Td (1 - Tdap) Never done   Lung Cancer Screening  Never done   COVID-19 Vaccine (2 - Pfizer risk series) 02/04/2020   Pneumonia Vaccine 27+ Years old (1 of 1 - PCV) Never done    Colorectal cancer screening: Type of screening: Cologuard. Completed 09/10/2021. Repeat every 3 years  Mammogram status: Ordered schedule 014/06/2022. Pt provided with contact info and advised to call to schedule appt.   Bone Density status: Completed 07/13/2021. Results reflect: Bone density results: OSTEOPENIA. Repeat every 5 years.  Lung Cancer Screening: (Low Dose CT Chest recommended if Age 88-80 years, 30 pack-year currently smoking OR have quit w/in 15years.) does not qualify.   Lung Cancer Screening Referral: n/a  Additional Screening:  Hepatitis C Screening: does not qualify;  Vision Screening: Recommended annual ophthalmology exams for early detection of glaucoma and other disorders of the eye. Is the patient up to date with their annual eye exam?  Yes  Who is the provider or what is the name of the office in which the patient attends annual eye exams? Dr.Lee  If pt is not established with a provider, would they like to be referred to a provider to establish care? No .   Dental Screening: Recommended annual dental  exams for proper oral hygiene  Community Resource Referral / Chronic Care Management: CRR required this visit?  No   CCM required this visit?  No      Plan:     I have personally reviewed and noted the following in the patient's chart:   Medical and social history Use of alcohol, tobacco or illicit drugs  Current medications and supplements including opioid prescriptions. Patient is not currently taking opioid prescriptions. Functional ability and status Nutritional status Physical activity Advanced directives List of other physicians Hospitalizations, surgeries, and ER visits in previous 12 months Vitals Screenings to include cognitive, depression, and falls Referrals and appointments  In addition, I have reviewed and discussed with patient certain preventive protocols, quality metrics, and best practice recommendations. A written personalized care plan for preventive services as well as general preventive health recommendations were provided to patient.     Daphane Shepherd, LPN   [id number]   Nurse Notes: Due TDAP vaccine

## 2022-07-11 ENCOUNTER — Ambulatory Visit
Admission: RE | Admit: 2022-07-11 | Discharge: 2022-07-11 | Disposition: A | Discharge status: Home / Self Care | Payer: Medicare Other | Source: Ambulatory Visit | Attending: Surgery | Admitting: Surgery

## 2022-07-11 DIAGNOSIS — E210 Primary hyperparathyroidism: Secondary | ICD-10-CM | POA: Diagnosis not present

## 2022-07-11 DIAGNOSIS — I6529 Occlusion and stenosis of unspecified carotid artery: Secondary | ICD-10-CM | POA: Diagnosis not present

## 2022-07-11 DIAGNOSIS — E079 Disorder of thyroid, unspecified: Secondary | ICD-10-CM | POA: Diagnosis not present

## 2022-07-11 MED ORDER — IOPAMIDOL (ISOVUE-300) INJECTION 61%
75.0000 mL | Freq: Once | INTRAVENOUS | Status: AC | PRN
  Administered 2022-07-11: 75 mL via INTRAVENOUS

## 2022-07-17 ENCOUNTER — Ambulatory Visit: Payer: Self-pay | Admitting: Surgery

## 2022-07-17 NOTE — Progress Notes (Signed)
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  Will plan neck exploration with parathyroidectomy at [facility] in near future as we discussed.  Will plan outpatient surgery but may observe overnight if multiple glands removed.  Claiborne Billings - please send orders to schedulers to contact patient.  tmg  Armandina Gemma, [facility] [place] practice Office: [phone]

## 2022-07-19 ENCOUNTER — Encounter (HOSPITAL_COMMUNITY): Payer: Self-pay

## 2022-07-25 ENCOUNTER — Encounter: Payer: Self-pay | Admitting: Radiology

## 2022-07-26 NOTE — Patient Instructions (Signed)
SURGICAL WAITING ROOM VISITATION Patients having surgery or a procedure may have no more than 2 support people in the waiting area - these visitors may rotate.    If the patient needs to stay at the hospital during part of their recovery, the visitor guidelines for inpatient rooms apply. Pre-op nurse will coordinate an appropriate time for 1 support person to accompany patient in pre-op.  This support person may not rotate.    Please refer to the [facility] website for the visitor guidelines for Inpatients (after your surgery is over and you are in a regular room).   Due to an increase in RSV and influenza rates and associated hospitalizations, children ages 35 and under may not visit patients in [place].     Your procedure is scheduled on: 07-30-22   Report to [facility] Main Entrance    Report to admitting at 7:15 AM   Call this number if you have problems the morning of surgery [phone]   Do not eat food :After Midnight.   After Midnight you may have the following liquids until 6:30 AM DAY OF SURGERY  Water Non-Citrus Juices (without pulp, NO RED) Carbonated Beverages Black Coffee (NO MILK/CREAM OR CREAMERS, sugar ok)  Clear Tea (NO MILK/CREAM OR CREAMERS, sugar ok) regular and decaf                             Plain Jell-O (NO RED)                                           Fruit ices (not with fruit pulp, NO RED)                                     Popsicles (NO RED)                                                               Sports drinks like Gatorade (NO RED)                      If you have questions, please contact your surgeon's office.   FOLLOW  ANY ADDITIONAL PRE OP INSTRUCTIONS YOU RECEIVED FROM YOUR SURGEON'S OFFICE!!!     Oral Hygiene is also important to reduce your risk of infection.                                    Remember - BRUSH YOUR TEETH THE MORNING OF SURGERY WITH YOUR REGULAR TOOTHPASTE   Do NOT smoke after  Midnight   Take these medicines the morning of surgery with A SIP OF WATER:   Letrozole  Omeprazole  Tamoxifen  Tylenol if needed                               You may not have any metal on your body including hair pins, jewelry, and body piercing  Do not wear make-up, lotions, powders, perfumes or deodorant  Do not wear nail polish including gel and S&S, artificial/acrylic nails, or any other type of covering on natural nails including finger and toenails. If you have artificial nails, gel coating, etc. that needs to be removed by a nail salon please have this removed prior to surgery or surgery may need to be canceled/ delayed if the surgeon/ anesthesia feels like they are unable to be safely monitored.   Do not shave  48 hours prior to surgery.    Do not bring valuables to the hospital. JAARS.   Contacts, dentures or bridgework may not be worn into surgery.   Bring small overnight bag day of surgery.   DO NOT [place]. PHARMACY WILL DISPENSE MEDICATIONS LISTED ON YOUR MEDICATION LIST TO YOU DURING YOUR ADMISSION [place]!              Please read over the following fact sheets you were given: IF [place]  If you received a COVID test during your pre-op visit  it is requested that you wear a mask when out in public, stay away from anyone that may not be feeling well and notify your surgeon if you develop symptoms. If you test positive for Covid or have been in contact with anyone that has tested positive in the last 10 days please notify you surgeon.  [place] - Preparing for Surgery Before surgery, you can play an important role.  Because skin is not sterile, your skin needs to be as free of germs as possible.  You can reduce the number of germs on your skin by washing with CHG (chlorahexidine gluconate) soap before  surgery.  CHG is an antiseptic cleaner which kills germs and bonds with the skin to continue killing germs even after washing. Please DO NOT use if you have an allergy to CHG or antibacterial soaps.  If your skin becomes reddened/irritated stop using the CHG and inform your nurse when you arrive at Short Stay. Do not shave (including legs and underarms) for at least 48 hours prior to the first CHG shower.  You may shave your face/neck.  Please follow these instructions carefully:  1.  Shower with CHG Soap the night before surgery and the  morning of surgery.  2.  If you choose to wash your hair, wash your hair first as usual with your normal  shampoo.  3.  After you shampoo, rinse your hair and body thoroughly to remove the shampoo.                             4.  Use CHG as you would any other liquid soap.  You can apply chg directly to the skin and wash.  Gently with a scrungie or clean washcloth.  5.  Apply the CHG Soap to your body ONLY FROM THE NECK DOWN.   Do   not use on face/ open                           Wound or open sores. Avoid contact with eyes, ears mouth and   genitals (private parts).  Wash face,  Genitals (private parts) with your normal soap.             6.  Wash thoroughly, paying special attention to the area where your    surgery  will be performed.  7.  Thoroughly rinse your body with warm water from the neck down.  8.  DO NOT shower/wash with your normal soap after using and rinsing off the CHG Soap.                9.  Pat yourself dry with a clean towel.            10.  Wear clean pajamas.            11.  Place clean sheets on your bed the night of your first shower and do not  sleep with pets. Day of Surgery : Do not apply any lotions/deodorants the morning of surgery.  Please wear clean clothes to the hospital/surgery center.  FAILURE TO FOLLOW THESE INSTRUCTIONS MAY RESULT IN THE CANCELLATION OF YOUR SURGERY  PATIENT  SIGNATURE_________________________________  NURSE SIGNATURE__________________________________  ________________________________________________________________________

## 2022-07-26 NOTE — Progress Notes (Addendum)
COVID Vaccine Completed:  Yes  Date of COVID positive in last 90 days:  PCP - Darla Lesches, FNP Cardiologist -   Chest x-ray -  EKG -  Stress Test -  ECHO -  Cardiac Cath -  Pacemaker/ICD device last checked: Spinal Cord Stimulator:  Bowel Prep -   Sleep Study -  CPAP -   Fasting Blood Sugar -  Checks Blood Sugar _____ times a day  Last dose of GLP1 agonist-  N/A GLP1 instructions:  N/A   Last dose of SGLT-2 inhibitors-  N/A SGLT-2 instructions: N/A   Blood Thinner Instructions: Aspirin Instructions: Last Dose:  Activity level:  Can go up a flight of stairs and perform activities of daily living without stopping and without symptoms of chest pain or shortness of breath.  Able to exercise without symptoms  Unable to go up a flight of stairs without symptoms of     Anesthesia review:   Patient denies shortness of breath, fever, cough and chest pain at PAT appointment  Patient verbalized understanding of instructions that were given to them at the PAT appointment. Patient was also instructed that they will need to review over the PAT instructions again at home before surgery.

## 2022-07-27 ENCOUNTER — Encounter (HOSPITAL_COMMUNITY): Payer: Self-pay | Admitting: Surgery

## 2022-07-27 DIAGNOSIS — E210 Primary hyperparathyroidism: Secondary | ICD-10-CM | POA: Diagnosis present

## 2022-07-27 NOTE — H&P (Signed)
REFERRING PHYSICIAN: Derek Jack, MD  PROVIDER: Kell Ferris Charlotta Newton, MD   Chief Complaint: New Consultation (Primary hyperparathyroidism)  History of Present Illness:  Patient is referred by Dr. Derek Diaz for surgical evaluation and management of primary hyperparathyroidism. Patient had been noted several years ago to have hypercalcemia. Levels have gradually increased. Her most recent laboratory studies show an elevated calcium level of 11.8 and an elevated intact PTH level of 78. 24-hour urine collection for calcium was normal at 189. Patient underwent an ultrasound examination which did not identify a parathyroid adenoma but did show a exophytic 2.6 cm ill-defined nodule at the inferior left thyroid lobe. Fine-needle aspiration biopsy was benign. Nuclear medicine parathyroid scan demonstrated uptake at the inferior left thyroid lobe as well as subtle uptake at the upper poles of the thyroid lobes bilaterally. No definite parathyroid adenoma was identified. Patient has had no prior head or neck surgery. There is no family history of parathyroid disease. Patient lives in [place], [place]. She is originally from [place], [place]. 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 She is accompanied by her son. Patient does have osteopenia. She complains of bone and joint discomfort. She denies significant fatigue. She denies nephrolithiasis. She does have issues with chronic constipation.  Review of Systems: A complete review of systems was obtained from the patient. I have reviewed this information and discussed as appropriate with the patient. See HPI as well for other ROS.  Review of Systems  Constitutional: Negative. Negative for malaise/fatigue.  HENT: Negative.  Eyes: Negative.  Respiratory: Negative.  Cardiovascular: Negative.  Gastrointestinal: Positive for constipation.  Genitourinary: Positive for frequency.  Musculoskeletal: Positive for joint  pain.  Skin: Negative.  Neurological: Negative.  Endo/Heme/Allergies: Negative.  Psychiatric/Behavioral: Negative.    Medical History: Past Medical History:  Diagnosis Date  Arthritis  GERD (gastroesophageal reflux disease)  History of cancer  Hypertension   Patient Active Problem List  Diagnosis  Primary hyperparathyroidism (CMS-HCC)   Past Surgical History:  Procedure Laterality Date  AJCC BREAST CANCER STAGE I: T1MIC, T1A OR T1B (TUMOR SIZE < 1 CM) DOCUMENTED (ONC)  breast lumpectomy  CESAREAN SECTION    No Known Allergies  Current Outpatient Medications on File Prior to Visit  Medication Sig Dispense Refill  atorvastatin (LIPITOR) 20 MG tablet Take 20 mg by mouth at bedtime  baclofen (LIORESAL) 10 MG tablet Take 10 mg by mouth 3 (three) times daily as needed  betamethasone dipropionate (DIPROSONE) 0.05 % cream Apply topically  linaCLOtide (LINZESS) 145 mcg capsule Take by mouth  montelukast (SINGULAIR) 10 mg tablet Take 1 tablet by mouth every evening  olmesartan (BENICAR) 40 MG tablet Take 1 tablet by mouth once daily  omeprazole (PRILOSEC) 40 MG DR capsule Take by mouth  tamoxifen (NOLVADEX) 20 MG tablet Take 20 mg by mouth once daily   No current facility-administered medications on file prior to visit.   Family History  Problem Relation Age of Onset  Obesity Mother  Breast cancer Mother  Skin cancer Father  Coronary Artery Disease (Blocked arteries around heart) Father  Diabetes Father    Social History   Tobacco Use  Smoking Status Former  Types: Cigarettes  Smokeless Tobacco Never    Social History   Socioeconomic History  Marital status: Married  Tobacco Use  Smoking status: Former  Types: Cigarettes  Smokeless tobacco: Never  Substance and Sexual Activity  Alcohol use: Not Currently  Drug use: Never   Objective:   Vitals:  BP: 120/77  Pulse: 105  Temp: 36.7 C (98 F)  SpO2: 97%  Weight: 93.4 kg (206 lb)  Height: 172.7 cm (5'  8")   Body mass index is 31.32 kg/m.  Physical Exam   GENERAL APPEARANCE Comfortable, no acute issues Development: normal Gross deformities: none  SKIN Rash, lesions, ulcers: none Induration, erythema: none Nodules: none palpable  EYES Conjunctiva and lids: normal Pupils: equal and reactive  EARS, NOSE, MOUTH, THROAT External ears: no lesion or deformity External nose: no lesion or deformity Hearing: grossly normal  NECK Symmetric: yes Trachea: midline Thyroid: no palpable nodules in the thyroid bed  CHEST Respiratory effort: normal Retraction or accessory muscle use: no Breath sounds: normal bilaterally Rales, rhonchi, wheeze: none  CARDIOVASCULAR Auscultation: regular rhythm, normal rate Murmurs: none Pulses: radial pulse 2+ palpable Lower extremity edema: none  ABDOMEN Not assessed  GENITOURINARY/RECTAL Not assessed  MUSCULOSKELETAL Station and gait: normal Digits and nails: no clubbing or cyanosis Muscle strength: grossly normal all extremities Range of motion: grossly normal all extremities Deformity: none  LYMPHATIC Cervical: none palpable Supraclavicular: none palpable  PSYCHIATRIC Oriented to person, place, and time: yes Mood and affect: normal for situation Judgment and insight: appropriate for situation   Assessment and Plan:   Primary hyperparathyroidism (CMS-HCC)  Patient is referred by her medical oncologist for surgical evaluation and management of primary hyperparathyroidism.  Patient provided with a copy of "Parathyroid Surgery: Treatment for Your Parathyroid Gland Problem", published by [employer], 12 pages. Book reviewed and explained to patient during visit today.  Today we reviewed her clinical history. We reviewed her laboratory studies as well as her imaging studies including a nuclear medicine parathyroid scan and an ultrasound. The studies failed to identify a parathyroid adenoma. However, there is a 2.6 cm nodule at the  inferior left thyroid lobe which is exophytic and there is uptake on nuclear medicine parathyroid scanning at this position. This may actually represent a parathyroid adenoma. However, since the studies are not definitive, we will obtain a 4D CT scan of the neck in hopes of identifying the location of the parathyroid adenoma and making her a candidate for minimally invasive surgery. Today we discussed the procedure. We discussed the size and location of the surgical incision. We discussed the hospital stay to be anticipated. Patient understands and agrees to proceed with further imaging studies. We will contact her with these results when they are available and make plans for further management at that time.   Amber Gemma, MD [facility] [place] practice Office: [phone]

## 2022-07-29 ENCOUNTER — Other Ambulatory Visit: Payer: Self-pay

## 2022-07-29 ENCOUNTER — Encounter (HOSPITAL_COMMUNITY): Payer: Self-pay

## 2022-07-29 ENCOUNTER — Encounter (HOSPITAL_COMMUNITY)
Admission: RE | Admit: 2022-07-29 | Discharge: 2022-07-29 | Disposition: A | Discharge status: Home / Self Care | Payer: Medicare Other | Source: Ambulatory Visit | Attending: Surgery | Admitting: Surgery

## 2022-07-29 VITALS — BP 143/68 | HR 70 | Temp 98.4°F | Resp 16 | Ht 68.0 in | Wt 256.4 lb

## 2022-07-29 DIAGNOSIS — I10 Essential (primary) hypertension: Secondary | ICD-10-CM

## 2022-07-29 DIAGNOSIS — Z01818 Encounter for other preprocedural examination: Secondary | ICD-10-CM

## 2022-07-29 HISTORY — DX: Dyspnea, unspecified: R06.00

## 2022-07-29 HISTORY — DX: Unspecified osteoarthritis, unspecified site: M19.90

## 2022-07-29 HISTORY — DX: Headache, unspecified: R51.9

## 2022-07-29 HISTORY — DX: Gastro-esophageal reflux disease without esophagitis: K21.9

## 2022-07-29 LAB — BASIC METABOLIC PANEL
Anion gap: 4 — ABNORMAL LOW (ref 5–15)
BUN: 21 mg/dL (ref 8–23)
CO2: 22 mmol/L (ref 22–32)
Calcium: 9.7 mg/dL (ref 8.9–10.3)
Chloride: 112 mmol/L — ABNORMAL HIGH (ref 98–111)
Creatinine, Ser: 0.93 mg/dL (ref 0.44–1.00)
GFR, Estimated: >60 mL/min (ref >60)
Glucose, Bld: 144 mg/dL — ABNORMAL HIGH (ref 70–99)
Potassium: 3.9 mmol/L (ref 3.5–5.1)
Sodium: 138 mmol/L (ref 135–145)

## 2022-07-29 LAB — CBC
HCT: 40.3 % (ref 36.0–46.0)
Hemoglobin: 12.2 g/dL (ref 12.0–15.0)
MCH: 27.3 pg (ref 26.0–34.0)
MCHC: 30.3 g/dL (ref 30.0–36.0)
MCV: 90.2 fL (ref 80.0–100.0)
Platelets: 247 10*3/uL (ref 150–400)
RBC: 4.47 MIL/uL (ref 3.87–5.11)
RDW: 14.2 % (ref 11.5–15.5)
WBC: 9.8 10*3/uL (ref 4.0–10.5)
nRBC: 0 % (ref 0.0–0.2)

## 2022-07-30 ENCOUNTER — Other Ambulatory Visit: Payer: Self-pay

## 2022-07-30 ENCOUNTER — Ambulatory Visit (HOSPITAL_COMMUNITY)
Admission: RE | Admit: 2022-07-30 | Discharge: 2022-07-31 | Disposition: A | Discharge status: Home / Self Care | Payer: Medicare Other | Attending: Surgery | Admitting: Surgery

## 2022-07-30 ENCOUNTER — Encounter (HOSPITAL_COMMUNITY): Payer: Self-pay | Admitting: Surgery

## 2022-07-30 ENCOUNTER — Ambulatory Visit (HOSPITAL_BASED_OUTPATIENT_CLINIC_OR_DEPARTMENT_OTHER): Payer: Medicare Other | Admitting: Certified Registered"

## 2022-07-30 ENCOUNTER — Ambulatory Visit (HOSPITAL_COMMUNITY): Payer: Medicare Other | Admitting: Physician Assistant

## 2022-07-30 ENCOUNTER — Encounter (HOSPITAL_COMMUNITY)
Admission: RE | Disposition: A | Discharge status: Home / Self Care | Payer: Self-pay | Source: Home / Self Care | Attending: Surgery

## 2022-07-30 DIAGNOSIS — I10 Essential (primary) hypertension: Secondary | ICD-10-CM | POA: Diagnosis not present

## 2022-07-30 DIAGNOSIS — K5909 Other constipation: Secondary | ICD-10-CM | POA: Insufficient documentation

## 2022-07-30 DIAGNOSIS — K219 Gastro-esophageal reflux disease without esophagitis: Secondary | ICD-10-CM | POA: Diagnosis not present

## 2022-07-30 DIAGNOSIS — M8580 Other specified disorders of bone density and structure, unspecified site: Secondary | ICD-10-CM | POA: Insufficient documentation

## 2022-07-30 DIAGNOSIS — Z87891 Personal history of nicotine dependence: Secondary | ICD-10-CM | POA: Diagnosis not present

## 2022-07-30 DIAGNOSIS — E210 Primary hyperparathyroidism: Secondary | ICD-10-CM | POA: Diagnosis not present

## 2022-07-30 HISTORY — PX: PARATHYROIDECTOMY: SHX19

## 2022-07-30 SURGERY — PARATHYROIDECTOMY
Anesthesia: General

## 2022-07-30 MED ORDER — ROCURONIUM BROMIDE 10 MG/ML (PF) SYRINGE
PREFILLED_SYRINGE | INTRAVENOUS | Status: AC
  Filled 2022-07-30: qty 10

## 2022-07-30 MED ORDER — TAMOXIFEN CITRATE 10 MG PO TABS
20.0000 mg | ORAL_TABLET | Freq: Every day | ORAL | Status: DC
  Administered 2022-07-30: 20 mg via ORAL
  Filled 2022-07-30 (×2): qty 2

## 2022-07-30 MED ORDER — IRBESARTAN 75 MG PO TABS
75.0000 mg | ORAL_TABLET | Freq: Every day | ORAL | Status: DC
  Administered 2022-07-30 – 2022-07-31 (×2): 75 mg via ORAL
  Filled 2022-07-30 (×2): qty 1

## 2022-07-30 MED ORDER — BUPIVACAINE HCL 0.25 % IJ SOLN
INTRAMUSCULAR | Status: DC | PRN
  Administered 2022-07-30: 10 mL

## 2022-07-30 MED ORDER — ACETAMINOPHEN 500 MG PO TABS
1000.0000 mg | ORAL_TABLET | Freq: Once | ORAL | Status: AC
  Administered 2022-07-30: 1000 mg via ORAL

## 2022-07-30 MED ORDER — AMISULPRIDE (ANTIEMETIC) 5 MG/2ML IV SOLN: 10.0000 mg | Freq: Once | INTRAVENOUS | Status: DC | PRN

## 2022-07-30 MED ORDER — CEFAZOLIN SODIUM-DEXTROSE 2-4 GM/100ML-% IV SOLN
2.0000 g | INTRAVENOUS | Status: AC
  Administered 2022-07-30: 2 g via INTRAVENOUS
  Filled 2022-07-30: qty 100

## 2022-07-30 MED ORDER — FENTANYL CITRATE PF 50 MCG/ML IJ SOSY
25.0000 ug | PREFILLED_SYRINGE | INTRAMUSCULAR | Status: DC | PRN
  Administered 2022-07-30 (×2): 50 ug via INTRAVENOUS

## 2022-07-30 MED ORDER — LIDOCAINE HCL (PF) 2 % IJ SOLN
INTRAMUSCULAR | Status: AC
  Filled 2022-07-30: qty 5

## 2022-07-30 MED ORDER — MIDAZOLAM HCL 2 MG/2ML IJ SOLN
INTRAMUSCULAR | Status: AC
  Filled 2022-07-30: qty 2

## 2022-07-30 MED ORDER — ROCURONIUM BROMIDE 10 MG/ML (PF) SYRINGE
PREFILLED_SYRINGE | INTRAVENOUS | Status: DC | PRN
  Administered 2022-07-30: 20 mg via INTRAVENOUS
  Administered 2022-07-30: 60 mg via INTRAVENOUS

## 2022-07-30 MED ORDER — LETROZOLE 2.5 MG PO TABS: 2.5000 mg | ORAL_TABLET | Freq: Every day | ORAL | Status: DC

## 2022-07-30 MED ORDER — LIDOCAINE 2% (20 MG/ML) 5 ML SYRINGE
INTRAMUSCULAR | Status: DC | PRN
  Administered 2022-07-30: 60 mg via INTRAVENOUS

## 2022-07-30 MED ORDER — TRAMADOL HCL 50 MG PO TABS: 50.0000 mg | ORAL_TABLET | Freq: Four times a day (QID) | ORAL | Status: DC | PRN

## 2022-07-30 MED ORDER — DEXAMETHASONE SODIUM PHOSPHATE 10 MG/ML IJ SOLN
INTRAMUSCULAR | Status: AC
  Filled 2022-07-30: qty 1

## 2022-07-30 MED ORDER — CHLORHEXIDINE GLUCONATE CLOTH 2 % EX PADS: 6.0000 | MEDICATED_PAD | Freq: Once | CUTANEOUS | Status: DC

## 2022-07-30 MED ORDER — AMISULPRIDE (ANTIEMETIC) 5 MG/2ML IV SOLN
INTRAVENOUS | Status: AC
  Filled 2022-07-30: qty 4

## 2022-07-30 MED ORDER — ORAL CARE MOUTH RINSE: 15.0000 mL | Freq: Once | OROMUCOSAL | Status: AC

## 2022-07-30 MED ORDER — CHLORHEXIDINE GLUCONATE 0.12 % MT SOLN
15.0000 mL | Freq: Once | OROMUCOSAL | Status: AC
  Administered 2022-07-30: 15 mL via OROMUCOSAL

## 2022-07-30 MED ORDER — FENTANYL CITRATE (PF) 100 MCG/2ML IJ SOLN
INTRAMUSCULAR | Status: DC | PRN
  Administered 2022-07-30: 50 ug via INTRAVENOUS
  Administered 2022-07-30: 100 ug via INTRAVENOUS

## 2022-07-30 MED ORDER — FENTANYL CITRATE PF 50 MCG/ML IJ SOSY
PREFILLED_SYRINGE | INTRAMUSCULAR | Status: AC
  Administered 2022-07-30: 50 ug via INTRAVENOUS
  Filled 2022-07-30: qty 3

## 2022-07-30 MED ORDER — ONDANSETRON HCL 4 MG/2ML IJ SOLN: 4.0000 mg | Freq: Four times a day (QID) | INTRAMUSCULAR | Status: DC | PRN

## 2022-07-30 MED ORDER — PROPOFOL 10 MG/ML IV BOLUS
INTRAVENOUS | Status: AC
  Filled 2022-07-30: qty 20

## 2022-07-30 MED ORDER — MIDAZOLAM HCL 2 MG/2ML IJ SOLN
INTRAMUSCULAR | Status: DC | PRN
  Administered 2022-07-30: 2 mg via INTRAVENOUS

## 2022-07-30 MED ORDER — PROPOFOL 10 MG/ML IV BOLUS
INTRAVENOUS | Status: DC | PRN
  Administered 2022-07-30: 170 mg via INTRAVENOUS

## 2022-07-30 MED ORDER — FENTANYL CITRATE (PF) 100 MCG/2ML IJ SOLN
INTRAMUSCULAR | Status: AC
  Filled 2022-07-30: qty 2

## 2022-07-30 MED ORDER — DEXAMETHASONE SODIUM PHOSPHATE 10 MG/ML IJ SOLN
INTRAMUSCULAR | Status: DC | PRN
  Administered 2022-07-30: 4 mg via INTRAVENOUS

## 2022-07-30 MED ORDER — ACETAMINOPHEN 650 MG RE SUPP: 650.0000 mg | Freq: Four times a day (QID) | RECTAL | Status: DC | PRN

## 2022-07-30 MED ORDER — OXYCODONE HCL 5 MG PO TABS: 5.0000 mg | ORAL_TABLET | ORAL | Status: DC | PRN

## 2022-07-30 MED ORDER — PANTOPRAZOLE SODIUM 40 MG PO TBEC
40.0000 mg | DELAYED_RELEASE_TABLET | Freq: Every day | ORAL | Status: DC
  Administered 2022-07-30 – 2022-07-31 (×2): 40 mg via ORAL
  Filled 2022-07-30 (×2): qty 1

## 2022-07-30 MED ORDER — PHENYLEPHRINE 80 MCG/ML (10ML) SYRINGE FOR IV PUSH (FOR BLOOD PRESSURE SUPPORT)
PREFILLED_SYRINGE | INTRAVENOUS | Status: AC
  Filled 2022-07-30: qty 10

## 2022-07-30 MED ORDER — SUGAMMADEX SODIUM 200 MG/2ML IV SOLN
INTRAVENOUS | Status: DC | PRN
  Administered 2022-07-30: 390 mg via INTRAVENOUS

## 2022-07-30 MED ORDER — ONDANSETRON 4 MG PO TBDP: 4.0000 mg | ORAL_TABLET | Freq: Four times a day (QID) | ORAL | Status: DC | PRN

## 2022-07-30 MED ORDER — HYDROMORPHONE HCL 1 MG/ML IJ SOLN: 1.0000 mg | INTRAMUSCULAR | Status: DC | PRN

## 2022-07-30 MED ORDER — PHENYLEPHRINE 80 MCG/ML (10ML) SYRINGE FOR IV PUSH (FOR BLOOD PRESSURE SUPPORT)
PREFILLED_SYRINGE | INTRAVENOUS | Status: DC | PRN
  Administered 2022-07-30: 160 ug via INTRAVENOUS
  Administered 2022-07-30: 80 ug via INTRAVENOUS
  Administered 2022-07-30: 160 ug via INTRAVENOUS

## 2022-07-30 MED ORDER — ACETAMINOPHEN 500 MG PO TABS
ORAL_TABLET | ORAL | Status: AC
  Filled 2022-07-30: qty 2

## 2022-07-30 MED ORDER — 0.9 % SODIUM CHLORIDE (POUR BTL) OPTIME
TOPICAL | Status: DC | PRN
  Administered 2022-07-30: 1000 mL

## 2022-07-30 MED ORDER — HEMOSTATIC AGENTS (NO CHARGE) OPTIME
TOPICAL | Status: DC | PRN
  Administered 2022-07-30: 1 via TOPICAL

## 2022-07-30 MED ORDER — ACETAMINOPHEN 325 MG PO TABS
650.0000 mg | ORAL_TABLET | Freq: Four times a day (QID) | ORAL | Status: DC | PRN
  Administered 2022-07-30 – 2022-07-31 (×2): 650 mg via ORAL
  Filled 2022-07-30 (×2): qty 2

## 2022-07-30 MED ORDER — ONDANSETRON HCL 4 MG/2ML IJ SOLN
INTRAMUSCULAR | Status: DC | PRN
  Administered 2022-07-30: 4 mg via INTRAVENOUS

## 2022-07-30 MED ORDER — SODIUM CHLORIDE 0.45 % IV SOLN: INTRAVENOUS | Status: DC

## 2022-07-30 MED ORDER — ONDANSETRON HCL 4 MG/2ML IJ SOLN
INTRAMUSCULAR | Status: AC
  Filled 2022-07-30: qty 2

## 2022-07-30 MED ORDER — OXYCODONE HCL 5 MG PO TABS
5.0000 mg | ORAL_TABLET | Freq: Once | ORAL | Status: AC
  Administered 2022-07-30: 5 mg via ORAL

## 2022-07-30 MED ORDER — BUPIVACAINE HCL (PF) 0.25 % IJ SOLN
INTRAMUSCULAR | Status: AC
  Filled 2022-07-30: qty 30

## 2022-07-30 MED ORDER — OXYCODONE HCL 5 MG PO TABS
ORAL_TABLET | ORAL | Status: AC
  Filled 2022-07-30: qty 1

## 2022-07-30 MED ORDER — LACTATED RINGERS IV SOLN: INTRAVENOUS | Status: DC

## 2022-07-30 SURGICAL SUPPLY — 35 items

## 2022-07-30 NOTE — Anesthesia Preprocedure Evaluation (Signed)
Anesthesia Evaluation  Patient identified by MRN, date of birth, ID band Patient awake    Reviewed: Allergy & Precautions, NPO status , Patient's Chart, lab work & pertinent test results  Airway Mallampati: II  TM Distance: >3 FB Neck ROM: Full    Dental  (+) Dental Advisory Given   Pulmonary neg pulmonary ROS, former smoker   breath sounds clear to auscultation       Cardiovascular hypertension, Pt. on medications  Rhythm:Regular Rate:Normal     Neuro/Psych  Headaches    GI/Hepatic Neg liver ROS,GERD  Medicated,,  Endo/Other  negative endocrine ROS    Renal/GU negative Renal ROS     Musculoskeletal  (+) Arthritis ,    Abdominal   Peds  Hematology negative hematology ROS (+)   Anesthesia Other Findings   Reproductive/Obstetrics                             Anesthesia Physical Anesthesia Plan  ASA: 2  Anesthesia Plan: General   Post-op Pain Management: Tylenol PO (pre-op)* and Toradol IV (intra-op)*   Induction: Intravenous  PONV Risk Score and Plan: 3 and Dexamethasone, Midazolam and Treatment may vary due to age or medical condition  Airway Management Planned: Oral ETT  Additional Equipment: None  Intra-op Plan:   Post-operative Plan: Extubation in OR  Informed Consent: I have reviewed the patients History and Physical, chart, labs and discussed the procedure including the risks, benefits and alternatives for the proposed anesthesia with the patient or authorized representative who has indicated his/her understanding and acceptance.     Dental advisory given  Plan Discussed with:   Anesthesia Plan Comments:        Anesthesia Quick Evaluation

## 2022-07-30 NOTE — Interval H&P Note (Signed)
History and Physical Interval Note:  07/30/2022 8:30 AM  Amber Diaz  has presented today for surgery, with the diagnosis of PRIMARY  HYPERPARATHYROIDISM.  The various methods of treatment have been discussed with the patient and family. After consideration of risks, benefits and other options for treatment, the patient has consented to    Procedure(s): NECK EXPLORATION WITH PARATHYROIDECTOMY (N/A) as a surgical intervention.    The patient's history has been reviewed, patient examined, no change in status, stable for surgery.  I have reviewed the patient's chart and labs.  Questions were answered to the patient's satisfaction.    Armandina Gemma, [facility] [place] practice Office: [place]

## 2022-07-30 NOTE — Anesthesia Procedure Notes (Signed)
Procedure Name: Intubation Date/Time: 07/30/2022 8:53 AM  Performed by: Niel Hummer, CRNAPre-anesthesia Checklist: Patient identified, Emergency Drugs available, Suction available and Patient being monitored Patient Re-evaluated:Patient Re-evaluated prior to induction Oxygen Delivery Method: Circle system utilized Preoxygenation: Pre-oxygenation with 100% oxygen Induction Type: IV induction Ventilation: Mask ventilation without difficulty Laryngoscope Size: Mac and 4 Grade View: Grade I Tube type: Oral Tube size: 7.0 mm Number of attempts: 1 Airway Equipment and Method: Stylet Placement Confirmation: ETT inserted through vocal cords under direct vision, positive ETCO2 and breath sounds checked- equal and bilateral Secured at: 22 cm Tube secured with: Tape Dental Injury: Teeth and Oropharynx as per pre-operative assessment

## 2022-07-30 NOTE — Transfer of Care (Signed)
Immediate Anesthesia Transfer of Care Note  Patient: Amber Diaz  Procedure(s) Performed: NECK EXPLORATION WITH PARATHYROIDECTOMY  Patient Location: PACU  Anesthesia Type:General  Level of Consciousness: awake, alert , and oriented  Airway & Oxygen Therapy: Patient Spontanous Breathing and Patient connected to face mask oxygen  Post-op Assessment: Report given to RN, Post -op Vital signs reviewed and stable, and Patient moving all extremities X 4  Post vital signs: Reviewed and stable  Last Vitals:  Vitals Value Taken Time  BP 154/85 07/30/22 1056  Temp    Pulse 76 07/30/22 1100  Resp 13 07/30/22 1100  SpO2 100 % 07/30/22 1100  Vitals shown include unvalidated device data.  Last Pain:  Vitals:   07/30/22 0736  TempSrc:   PainSc: 4       Patients Stated Pain Goal: 4 ([id number])  Complications: No notable events documented.

## 2022-07-30 NOTE — Anesthesia Postprocedure Evaluation (Signed)
Anesthesia Post Note  Patient: Amber Diaz  Procedure(s) Performed: NECK EXPLORATION WITH PARATHYROIDECTOMY     Patient location during evaluation: PACU Anesthesia Type: General Level of consciousness: awake and alert Pain management: pain level controlled Vital Signs Assessment: post-procedure vital signs reviewed and stable Respiratory status: spontaneous breathing, nonlabored ventilation, respiratory function stable and patient connected to nasal cannula oxygen Cardiovascular status: blood pressure returned to baseline and stable Postop Assessment: no apparent nausea or vomiting Anesthetic complications: no  No notable events documented.  Last Vitals:  Vitals:   07/30/22 1515 07/30/22 1615  BP: (!) 149/72 (!) 155/62  Pulse: 91 (!) 102  Resp: 17 18  Temp: 36.9 C 36.5 C  SpO2: 98% 97%    Last Pain:  Vitals:   07/30/22 1615  TempSrc: Oral  PainSc:                  Tiajuana Amass

## 2022-07-30 NOTE — Op Note (Signed)
Operative Note  Pre-operative Diagnosis:  primary hyperparathyroidism  Post-operative Diagnosis:  same  Surgeon:  Armandina Gemma, MD  Assistant:  Newt Minion, RN   Procedure:  Neck exploration, left superior parathyroidectomy, left inferior parathyroidectomy, biopsy of right superior parathyroid gland  Anesthesia:  general  Estimated Blood Loss:  25 cc  Drains: none         Specimen: to pathology  Indications:  Patient is referred by Dr. Derek Jack for surgical evaluation and management of primary hyperparathyroidism. Patient had been noted several years ago to have hypercalcemia. Levels have gradually increased. Her most recent laboratory studies show an elevated calcium level of 11.8 and an elevated intact PTH level of 78. 24-hour urine collection for calcium was normal at 189. Patient underwent an ultrasound examination which did not identify a parathyroid adenoma but did show a exophytic 2.6 cm ill-defined nodule at the inferior left thyroid lobe. Fine-needle aspiration biopsy was benign. Nuclear medicine parathyroid scan demonstrated uptake at the inferior left thyroid lobe as well as subtle uptake at the upper poles of the thyroid lobes bilaterally. No definite parathyroid adenoma was identified. Patient has had no prior head or neck surgery. There is no family history of parathyroid disease. Patient lives in [place], [place]. She is originally from [place], [place]. 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 She is accompanied by her son. Patient does have osteopenia. She complains of bone and joint discomfort. She denies significant fatigue. She denies nephrolithiasis. She does have issues with chronic constipation.   Procedure:  The patient was seen in the pre-op holding area. The risks, benefits, complications, treatment options, and expected outcomes were previously discussed with the patient. The patient agreed with the proposed plan and has signed the  informed consent form.  The patient was brought to the operating room by the surgical team, identified as [name] and the procedure verified. A "time out" was completed and the above information confirmed.  Following induction of general anesthesia, the patient is positioned and then prepped and draped in the usual aseptic fashion.  After ascertaining that an adequate level of anesthesia been achieved, a Kocher incision is made with a #15 blade.  Dissection was carried through subcutaneous tissues and platysma.  Hemostasis is achieved with the electrocautery.  Skin flaps are elevated cephalad and caudad.  Self-retaining retractors placed for exposure.  Strap muscles are incised in the midline.  Dissection has begun on the left side.  Strap muscles were reflected laterally exposing a normal size left thyroid lobe.  There is a dominant nodule on the lateral aspect of the inferior pole of the thyroid.  This measures approximately 1.5 cm in greatest dimension.  It is gently dissected out.  Vascular structures are divided with the harmonic scalpel.  The gland is completely excised and appears to be an enlarged parathyroid gland.  It is submitted to pathology where frozen section biopsy confirms hypercellular parathyroid tissue consistent with adenoma.  Further dissection on the left side reveals a markedly enlarged nodular mass posterior to the superior pole.  This is also dissected out.  It is mobilized.  Structures are divided with the harmonic scalpel.  Vascular pedicle is identified adjacent to the recurrent laryngeal nerve which is preserved.  Vascular pedicles divided between medium ligaclips with the harmonic scalpel and the gland is excised.  Recurrent nerve appears undisturbed.  Good hemostasis is noted.  The mass is submitted to pathology.  It weighs nearly 7 g.  It is consistent  with hypercellular parathyroid tissue.  Next we turned our attention to the right side of the neck.  Imaging studies had  indicated the presence of a right superior parathyroid which was mildly enlarged at 9 mm.  Dissection allowed for mobilization of the right thyroid lobe.  This lobe appeared grossly normal.  No enlarged parathyroid tissue was immediately identified.  Dissection posteriorly along the precervical fascia and lateral aspect of the esophagus reveals a parathyroid gland in the right superior position.  This appears to correspond with the gland noted on CT scan.  Grossly it appears normal.  A biopsy is taken from this gland using a medium ligaclips to transect the tip of the gland.  This is submitted for frozen section.  Hypercellular parathyroid tissue is identified.  Additional dissection on the right side fails to reveal any evidence of an inferior parathyroid gland.  No normal tissue was identified.  No abnormally enlarged tissue was identified.  After consideration, decision was made to leave the right superior gland in situ.  It appears relatively normal.  2 grossly abnormal glands have been removed from the left neck.  The right inferior gland has not been identified.  I do not want to end up with a patient with permanent hypoparathyroidism.  Therefore we will leave the right superior gland in position and follow the patient with laboratory studies over the coming weeks and months.  If necessary we could come back for the right superior gland which is posterior to the right superior pole of the thyroid adjacent to the lateral edge of the esophagus and marked with a medium ligaclips.  Neck is irrigated with warm saline.  Good hemostasis is achieved throughout the operative field.  Fibrillar was placed throughout the operative field.  Strap muscles are reapproximated in the midline with interrupted 3-0 Vicryl sutures.  Platysma was closed with interrupted 3-0 Vicryl sutures.  Skin is anesthetized with local anesthetic.  Skin edges are reapproximated with a running 4-0 Monocryl subcuticular suture.  Wound was  washed and dried and Dermabond is applied as dressing.  Patient is awakened from anesthesia and transported to the recovery room in stable condition.  The patient tolerated the procedure well.   Armandina Gemma, [facility] Office: [phone]

## 2022-07-31 ENCOUNTER — Encounter (HOSPITAL_COMMUNITY): Payer: Self-pay | Admitting: Surgery

## 2022-07-31 DIAGNOSIS — K5909 Other constipation: Secondary | ICD-10-CM | POA: Diagnosis not present

## 2022-07-31 DIAGNOSIS — K219 Gastro-esophageal reflux disease without esophagitis: Secondary | ICD-10-CM | POA: Diagnosis not present

## 2022-07-31 DIAGNOSIS — Z87891 Personal history of nicotine dependence: Secondary | ICD-10-CM | POA: Diagnosis not present

## 2022-07-31 DIAGNOSIS — I10 Essential (primary) hypertension: Secondary | ICD-10-CM | POA: Diagnosis not present

## 2022-07-31 DIAGNOSIS — E210 Primary hyperparathyroidism: Secondary | ICD-10-CM | POA: Diagnosis not present

## 2022-07-31 DIAGNOSIS — M8580 Other specified disorders of bone density and structure, unspecified site: Secondary | ICD-10-CM | POA: Diagnosis not present

## 2022-07-31 LAB — CALCIUM: Calcium: 9.8 mg/dL (ref 8.9–10.3)

## 2022-07-31 NOTE — Discharge Summary (Signed)
    Physician Discharge Summary   Patient ID: Amber Diaz MRN: [id number] DOB/AGE: 1956-02-25 67 y.o.  Admit date: 07/30/2022  Discharge date: 07/31/2022  Discharge Diagnoses:  Principal Problem:   Hyperparathyroidism, primary [facility]) Active Problems:   Primary hyperparathyroidism [facility])   Discharged Condition: good  Hospital Course: Patient was admitted for observation following parathyroid surgery.  Post op course was uncomplicated.  Pain was well controlled.  Tolerated diet.  Post op calcium level on morning following surgery was 9.8 mg/dl.  Patient was prepared for discharge home on POD#1.  Consults: None  Treatments: surgery: parathyroidectomy (2 glands)  Discharge Exam: Blood pressure 126/76, pulse 88, temperature 98.6 F (37 C), temperature source Oral, resp. rate 18, height '5\' 8"'$  (1.727 m), weight 116.3 kg, SpO2 96 %. HEENT - clear Neck - wound dry and intact; mild STS; voice normal; Dermabond in place  Disposition: Home  Discharge Instructions     Diet - low sodium heart healthy   Complete by: As directed    Increase activity slowly   Complete by: As directed    No dressing needed   Complete by: As directed       Allergies as of 07/31/2022   No Known Allergies      Medication List     TAKE these medications    acetaminophen 325 MG tablet Commonly known as: TYLENOL Take 650 mg by mouth every 6 (six) hours as needed for moderate pain.   baclofen 10 MG tablet Commonly known as: LIORESAL Take 1 tablet (10 mg total) by mouth 3 (three) times daily as needed.   betamethasone dipropionate 0.05 % cream Apply 1 Application topically daily.   linaclotide 145 MCG Caps capsule Commonly known as: Linzess Take 1 capsule (145 mcg total) by mouth daily before breakfast. What changed:  when to take this reasons to take this   montelukast 10 MG tablet Commonly known as: SINGULAIR TAKE 1 TABLET BY MOUTH IN THE  EVENING What changed:  how much to take how to  take this when to take this reasons to take this additional instructions   olmesartan 40 MG tablet Commonly known as: BENICAR TAKE 1 TABLET BY MOUTH DAILY   omeprazole 40 MG capsule Commonly known as: PRILOSEC Take 1 capsule (40 mg total) by mouth daily.   tamoxifen 20 MG tablet Commonly known as: NOLVADEX Take 1 tablet (20 mg total) by mouth daily.               Discharge Care Instructions  (From admission, onward)           Start     Ordered   07/31/22 0000  No dressing needed        07/31/22 [id number]            Follow-up Information     Armandina Gemma, MD. Schedule an appointment as soon as possible for a visit in 3 week(s).   Specialty: General Surgery Why: For wound re-check Contact information: [address] [place] [address] [phone]                 Sherman Lipuma, [facility] Office: [phone]   Signed: Armandina Gemma 07/31/2022, 8:23 AM

## 2022-07-31 NOTE — TOC CM/SW Note (Signed)
Transition of Care (TOC) Screening Note  Patient Details  Name: Amber Diaz Date of Birth: 1955/11/23  Transition of Care [facility] CM/SW Contact:    Sherie Don, LCSW Phone Number: 07/31/2022, 8:12 AM  Transition of Care Department [facility]) has reviewed patient and no TOC needs have been identified at this time. We will continue to monitor patient advancement through interdisciplinary progression rounds. If new patient transition needs arise, please place a TOC consult.

## 2022-07-31 NOTE — Discharge Instructions (Signed)
[facility] - Dr. Lillard Bailon  THYROID & PARATHYROID SURGERY:  POST-OP INSTRUCTIONS  Always review the instruction sheet provided by the hospital nurse at discharge.  A prescription for pain medication may be sent to your pharmacy at the time of discharge.  Take your pain medication as prescribed.  If narcotic pain medicine is not needed, then you may take acetaminophen (Tylenol) or ibuprofen (Advil) as needed for pain or soreness.  Take your normal home medications as prescribed unless otherwise directed.  If you need a refill on your pain medication, please contact the office during regular business hours.  Prescriptions will not be processed by the office after 5:00PM or on weekends.  Start with a light diet upon arrival home, such as soup and crackers or toast.  Be sure to drink plenty of fluids.  Resume your normal diet the day after surgery.  Most patients will experience some swelling and bruising on the chest and neck area.  Ice packs will help for the first 48 hours after arriving home.  Swelling and bruising will take several days to resolve.   It is common to experience some constipation after surgery.  Increasing fluid intake and taking a stool softener (Colace) will usually help to prevent this problem.  A mild laxative (Milk of Magnesia or Miralax) should be taken according to package directions if there has been no bowel movement after 48 hours.  Dermabond glue covers your incision. This seals the wound and you may shower at any time. The Dermabond will remain in place for about a week.  You may gradually remove the glue when it loosens around the edges.  If you need to loosen the Dermabond for removal, apply a layer of Vaseline to the wound for 15 minutes and then remove with a Kleenex. Your sutures are under the skin and will not show - they will dissolve on their own.  You may resume light daily activities beginning the day after discharge (such as self-care,  walking, climbing stairs), gradually increasing activities as tolerated. You may have sexual intercourse when it is comfortable. Refrain from any heavy lifting or straining until approved by your doctor. You may drive when you no longer are taking prescription pain medication, you can comfortably wear a seatbelt, and you can safely maneuver your car and apply the brakes.  You will see your doctor in the office for a follow-up appointment approximately three weeks after your surgery.  Make sure that you call for this appointment within a day or two after you arrive home to insure a convenient appointment time. Please have any requested laboratory tests performed a few days prior to your office visit so that the results will be available at your follow up appointment.  WHEN TO CALL THE [facility] OFFICE: -- Fever greater than 101.5 -- Inability to urinate -- Nausea and/or vomiting - persistent -- Extreme swelling or bruising -- Continued bleeding from incision -- Increased pain, redness, or drainage from the incision -- Difficulty swallowing or breathing -- Muscle cramping or spasms -- Numbness or tingling in hands or around lips  The clinic staff is available to answer your questions during regular business hours.  Please don't hesitate to call and ask to speak to one of the nurses if you have concerns.  [facility] OFFICE: [phone] (24 hours)  Please sign up for MyChart accounts. This will allow you to communicate directly with my nurse or myself without having to call the office. It will also allow you   to view your test results. You will need to enroll in MyChart for my office ([facility]) and for the hospital ([place]).  Houa Nie, MD [facility] 

## 2022-07-31 NOTE — Plan of Care (Signed)
Patient is stable for discharge. Discharge instructions have been given. Questions answered, patient is discharged to home with family.

## 2022-08-01 LAB — SURGICAL PATHOLOGY

## 2022-08-02 NOTE — Progress Notes (Signed)
Pathology is benign as expected.  Will check labs prior to post op office visit.  tmg  Armandina Gemma, MD [facility] [place] [facility] Office: [phone]

## 2022-08-27 ENCOUNTER — Ambulatory Visit (HOSPITAL_COMMUNITY)
Admission: RE | Admit: 2022-08-27 | Discharge: 2022-08-27 | Disposition: A | Discharge status: Home / Self Care | Payer: Medicare Other | Source: Ambulatory Visit | Attending: Hematology | Admitting: Hematology

## 2022-08-27 ENCOUNTER — Encounter (HOSPITAL_COMMUNITY): Payer: Self-pay

## 2022-08-27 ENCOUNTER — Inpatient Hospital Stay: Payer: Medicare Other | Attending: Hematology

## 2022-08-27 DIAGNOSIS — D0512 Intraductal carcinoma in situ of left breast: Secondary | ICD-10-CM

## 2022-08-27 DIAGNOSIS — M8580 Other specified disorders of bone density and structure, unspecified site: Secondary | ICD-10-CM | POA: Insufficient documentation

## 2022-08-27 DIAGNOSIS — E210 Primary hyperparathyroidism: Secondary | ICD-10-CM | POA: Diagnosis not present

## 2022-08-27 DIAGNOSIS — Z79899 Other long term (current) drug therapy: Secondary | ICD-10-CM | POA: Diagnosis not present

## 2022-08-27 DIAGNOSIS — Z853 Personal history of malignant neoplasm of breast: Secondary | ICD-10-CM | POA: Diagnosis not present

## 2022-08-27 DIAGNOSIS — D649 Anemia, unspecified: Secondary | ICD-10-CM | POA: Diagnosis not present

## 2022-08-27 DIAGNOSIS — Z79810 Long term (current) use of selective estrogen receptor modulators (SERMs): Secondary | ICD-10-CM | POA: Diagnosis not present

## 2022-08-27 DIAGNOSIS — E892 Postprocedural hypoparathyroidism: Secondary | ICD-10-CM | POA: Diagnosis not present

## 2022-08-27 LAB — COMPREHENSIVE METABOLIC PANEL
ALT: 11 U/L (ref 0–44)
AST: 12 U/L — ABNORMAL LOW (ref 15–41)
Albumin: 3.3 g/dL — ABNORMAL LOW (ref 3.5–5.0)
Alkaline Phosphatase: 100 U/L (ref 38–126)
Anion gap: 7 (ref 5–15)
BUN: 16 mg/dL (ref 8–23)
CO2: 23 mmol/L (ref 22–32)
Calcium: 9.1 mg/dL (ref 8.9–10.3)
Chloride: 109 mmol/L (ref 98–111)
Creatinine, Ser: 0.89 mg/dL (ref 0.44–1.00)
GFR, Estimated: >60 mL/min (ref >60)
Glucose, Bld: 99 mg/dL (ref 70–99)
Potassium: 3.9 mmol/L (ref 3.5–5.1)
Sodium: 139 mmol/L (ref 135–145)
Total Bilirubin: 0.7 mg/dL (ref 0.3–1.2)
Total Protein: 6.8 g/dL (ref 6.5–8.1)

## 2022-08-27 LAB — CBC WITH DIFFERENTIAL/PLATELET
Abs Immature Granulocytes: 0.03 10*3/uL (ref 0.00–0.07)
Basophils Absolute: 0.1 10*3/uL (ref 0.0–0.1)
Basophils Relative: 1 %
Eosinophils Absolute: 0.3 10*3/uL (ref 0.0–0.5)
Eosinophils Relative: 2 %
HCT: 36.1 % (ref 36.0–46.0)
Hemoglobin: 11.4 g/dL — ABNORMAL LOW (ref 12.0–15.0)
Immature Granulocytes: 0 %
Lymphocytes Relative: 16 %
Lymphs Abs: 1.8 10*3/uL (ref 0.7–4.0)
MCH: 27.9 pg (ref 26.0–34.0)
MCHC: 31.6 g/dL (ref 30.0–36.0)
MCV: 88.3 fL (ref 80.0–100.0)
Monocytes Absolute: 0.6 10*3/uL (ref 0.1–1.0)
Monocytes Relative: 5 %
Neutro Abs: 8.6 10*3/uL — ABNORMAL HIGH (ref 1.7–7.7)
Neutrophils Relative %: 76 %
Platelets: 237 10*3/uL (ref 150–400)
RBC: 4.09 MIL/uL (ref 3.87–5.11)
RDW: 14.4 % (ref 11.5–15.5)
WBC: 11.3 10*3/uL — ABNORMAL HIGH (ref 4.0–10.5)
nRBC: 0 % (ref 0.0–0.2)

## 2022-08-27 LAB — VITAMIN D 25 HYDROXY (VIT D DEFICIENCY, FRACTURES): Vit D, 25-Hydroxy: 22.98 ng/mL — ABNORMAL LOW (ref 30–100)

## 2022-08-29 ENCOUNTER — Inpatient Hospital Stay: Payer: Medicare Other | Admitting: Hematology

## 2022-09-03 DIAGNOSIS — Z9089 Acquired absence of other organs: Secondary | ICD-10-CM | POA: Insufficient documentation

## 2022-09-03 DIAGNOSIS — Z9889 Other specified postprocedural states: Secondary | ICD-10-CM | POA: Insufficient documentation

## 2022-09-05 ENCOUNTER — Inpatient Hospital Stay: Payer: Medicare Other | Admitting: Hematology

## 2022-09-05 ENCOUNTER — Inpatient Hospital Stay: Payer: Medicare Other | Admitting: Physician Assistant

## 2022-09-05 VITALS — BP 131/61 | HR 71 | Temp 98.9°F | Resp 18 | Ht 68.0 in | Wt 257.8 lb

## 2022-09-05 DIAGNOSIS — D0512 Intraductal carcinoma in situ of left breast: Secondary | ICD-10-CM

## 2022-09-05 DIAGNOSIS — M8580 Other specified disorders of bone density and structure, unspecified site: Secondary | ICD-10-CM | POA: Diagnosis not present

## 2022-09-05 DIAGNOSIS — Z79899 Other long term (current) drug therapy: Secondary | ICD-10-CM | POA: Diagnosis not present

## 2022-09-05 DIAGNOSIS — D649 Anemia, unspecified: Secondary | ICD-10-CM

## 2022-09-05 LAB — COMPREHENSIVE METABOLIC PANEL
ALT: 13 U/L (ref 0–44)
AST: 15 U/L (ref 15–41)
Albumin: 3.5 g/dL (ref 3.5–5.0)
Alkaline Phosphatase: 98 U/L (ref 38–126)
Anion gap: 7 (ref 5–15)
BUN: 16 mg/dL (ref 8–23)
CO2: 26 mmol/L (ref 22–32)
Calcium: 9 mg/dL (ref 8.9–10.3)
Chloride: 105 mmol/L (ref 98–111)
Creatinine, Ser: 0.93 mg/dL (ref 0.44–1.00)
GFR, Estimated: >60 mL/min (ref >60)
Glucose, Bld: 92 mg/dL (ref 70–99)
Potassium: 3.7 mmol/L (ref 3.5–5.1)
Sodium: 138 mmol/L (ref 135–145)
Total Bilirubin: 0.7 mg/dL (ref 0.3–1.2)
Total Protein: 7.3 g/dL (ref 6.5–8.1)

## 2022-09-05 LAB — CBC WITH DIFFERENTIAL/PLATELET
Abs Immature Granulocytes: 0.03 10*3/uL (ref 0.00–0.07)
Basophils Absolute: 0.1 10*3/uL (ref 0.0–0.1)
Basophils Relative: 1 %
Eosinophils Absolute: 0.2 10*3/uL (ref 0.0–0.5)
Eosinophils Relative: 3 %
HCT: 38.3 % (ref 36.0–46.0)
Hemoglobin: 12 g/dL (ref 12.0–15.0)
Immature Granulocytes: 0 %
Lymphocytes Relative: 26 %
Lymphs Abs: 2 10*3/uL (ref 0.7–4.0)
MCH: 27.8 pg (ref 26.0–34.0)
MCHC: 31.3 g/dL (ref 30.0–36.0)
MCV: 88.7 fL (ref 80.0–100.0)
Monocytes Absolute: 0.5 10*3/uL (ref 0.1–1.0)
Monocytes Relative: 6 %
Neutro Abs: 4.7 10*3/uL (ref 1.7–7.7)
Neutrophils Relative %: 64 %
Platelets: 295 10*3/uL (ref 150–400)
RBC: 4.32 MIL/uL (ref 3.87–5.11)
RDW: 14.2 % (ref 11.5–15.5)
WBC: 7.5 10*3/uL (ref 4.0–10.5)
nRBC: 0 % (ref 0.0–0.2)

## 2022-09-05 LAB — IRON AND TIBC
Iron: 71 ug/dL (ref 28–170)
Saturation Ratios: 22 % (ref 10.4–31.8)
TIBC: 326 ug/dL (ref 250–450)
UIBC: 255 ug/dL

## 2022-09-05 LAB — VITAMIN D 25 HYDROXY (VIT D DEFICIENCY, FRACTURES): Vit D, 25-Hydroxy: 22.82 ng/mL — ABNORMAL LOW (ref 30–100)

## 2022-09-05 LAB — VITAMIN B12: Vitamin B-12: 224 pg/mL (ref 180–914)

## 2022-09-05 LAB — FOLATE: Folate: 8 ng/mL (ref >5.9)

## 2022-09-05 LAB — FERRITIN: Ferritin: 170 ng/mL (ref 11–307)

## 2022-09-05 MED ORDER — VITAMIN D 25 MCG (1000 UNIT) PO TABS: 1000.0000 [IU] | ORAL_TABLET | Freq: Every day | ORAL | 6 refills | Status: DC

## 2022-09-05 NOTE — Progress Notes (Signed)
[facility] [address] [place], [place] [id number]   Patient Care Team: Rakes, Doralee AlbinoLinda M, FNP as PCP - General (Family Medicine) Doreatha MassedKatragadda, Sreedhar, MD as Medical Oncologist (Medical Oncology)  SUMMARY OF ONCOLOGIC HISTORY: Oncology History  Ductal carcinoma in situ (DCIS) of left breast  07/06/2021 Initial Diagnosis   Ductal carcinoma in situ (DCIS) of left breast   07/17/2021 Cancer Staging   Staging form: Breast, AJCC 8th Edition - Clinical stage from 07/17/2021: Stage 0 (cTis (DCIS), cN0, cM0, G3, ER+, PR+, HER2: Not Assessed) - Signed by Doreatha MassedKatragadda, Sreedhar, MD on 07/17/2021 Stage prefix: Initial diagnosis Nuclear grade: G3 Histologic grading system: 3 grade system     CHIEF COMPLIANT: Follow-up of left breast DCIS   INTERVAL HISTORY: Ms. Philipp Deputyammy Swander is a 67 y.o. female seen for follow-up of left breast DCIS.  She was last seen by Dr. Ellin SabaKatragadda on 05/09/2022. In the interim, she continues on Tamoxifen therapy and underwent parathyroidectomy on 07/30/2022.   Ms. Earlene PlaterDavis reports she continues to feel fatigued which can intefere with her ADLs. She stil has diffuse joint pain with the tamoxifen therapy similar to the other prior anti-estrogen therapy. She reports her appetite is good without any weight changes. She denies nausea, vomiting or abdominal pain. She does have chronic constipation. She denies easy bruising or signs of active bleeding. She reports chronic cough and shortness of breath with exertion. Her night sweats are stable since undergoing treatment for her breast cancer. She has no other complaints.   REVIEW OF SYSTEMS:   Constitutional: Negative for appetite change, chills, fever and unexpected weight change. +fatigue HENT: Negative for mouth sores, nosebleeds, sore throat and trouble swallowing.   Eyes: Negative for eye problems and icterus.  Respiratory: Negative for hemoptysis and wheezing. +cough and shortness of breath with exertion Cardiovascular:  Negative for chest pain and leg swelling.  Gastrointestinal: Negative for abdominal pain, diarrhea, nausea and vomiting. +constipation Genitourinary: Negative for difficulty urinating, dysuria, frequency and hematuria.  +urinary incontinence Musculoskeletal: Negative for back pain, gait problem, neck pain and neck stiffness.  Skin:Negative for rash and ulcers Neurological: Negative for dizziness, extremity weakness, gait problem, headaches, light-headedness and seizures.  Hematological: Negative for adenopathy. Does not bruise/bleed easily.  Psychiatric/Behavioral: Negative for confusion, depression and sleep disturbance. The patient is not nervous/anxious.    I have reviewed the past medical history, past surgical history, social history and family history with the patient and they are unchanged from previous note.   ALLERGIES:   has No Known Allergies.   MEDICATIONS:  Current Outpatient Medications  Medication Sig Dispense Refill   acetaminophen (TYLENOL) 325 MG tablet Take 650 mg by mouth every 6 (six) hours as needed for moderate pain.     atorvastatin (LIPITOR) 20 MG tablet      baclofen (LIORESAL) 10 MG tablet Take 1 tablet (10 mg total) by mouth 3 (three) times daily as needed. 30 each 0   betamethasone dipropionate 0.05 % cream Apply 1 Application topically daily.     cholecalciferol (VITAMIN D3) 25 MCG (1000 UNIT) tablet Take 1 tablet (1,000 Units total) by mouth daily. 30 tablet 6   linaclotide (LINZESS) 145 MCG CAPS capsule Take 1 capsule (145 mcg total) by mouth daily before breakfast. (Patient taking differently: Take 145 mcg by mouth daily as needed (when not leaving the house).) 30 capsule 6   montelukast (SINGULAIR) 10 MG tablet TAKE 1 TABLET BY MOUTH IN THE  EVENING (Patient taking differently: Take 10 mg  by mouth daily as needed (allergies).) 100 tablet 1   olmesartan (BENICAR) 40 MG tablet TAKE 1 TABLET BY MOUTH DAILY 100 tablet 2   omeprazole (PRILOSEC) 40 MG capsule  Take 1 capsule (40 mg total) by mouth daily. 90 capsule 3   tamoxifen (NOLVADEX) 20 MG tablet Take 1 tablet (20 mg total) by mouth daily. 90 tablet 3   No current facility-administered medications for this visit.     PHYSICAL EXAMINATION: Performance status (ECOG): 1 - Symptomatic but completely ambulatory  Vitals:   09/05/22 1146  BP: 131/61  Pulse: 71  Resp: 18  Temp: 98.9 F (37.2 C)  SpO2: 97%   Wt Readings from Last 3 Encounters:  09/05/22 257 lb 12.8 oz (116.9 kg)  07/30/22 256 lb 6.4 oz (116.3 kg)  07/29/22 256 lb 6.4 oz (116.3 kg)    Constitutional: Oriented to person, place, and time and well-developed, well-nourished, and in no distress.  HENT:  Head: Normocephalic and atraumatic.  Eyes: Conjunctivae are normal. Right eye exhibits no discharge. Left eye exhibits no discharge. No scleral icterus.  Neck: Normal range of motion. Neck supple.   Cardiovascular: Normal rate, regular rhythm, normal heart sounds Pulmonary/Chest: Effort normal and breath sounds normal. No respiratory distress. No wheezes. No rales.  Musculoskeletal: Normal range of motion. Exhibits no edema.  Lymphadenopathy: No cervical adenopathy.  Neurological: Alert and oriented to person, place, and time. Exhibits normal muscle tone. Gait normal. Coordination normal.  Skin: Skin is warm and dry. No rash noted. Not diaphoretic. No erythema. No pallor.  Psychiatric: Mood, memory and judgment normal.  Breast: Left breast lumpectomy scar is within normal limits.  No palpable masses in bilateral breasts.  No palpable adenopathy.    LABORATORY DATA:  I have reviewed the data as listed    Latest Ref Rng & Units 08/27/2022   10:54 AM 07/31/2022    4:43 AM 07/29/2022    2:18 PM  CMP  Glucose 70 - 99 mg/dL 99   182   BUN 8 - 23 mg/dL 16   21   Creatinine 9.93 - 1.00 mg/dL 7.16   9.67   Sodium 893 - 145 mmol/L 139   138   Potassium 3.5 - 5.1 mmol/L 3.9   3.9   Chloride 98 - 111 mmol/L 109   112   CO2 22 -  32 mmol/L 23   22   Calcium 8.9 - 10.3 mg/dL 9.1  9.8  9.7   Total Protein 6.5 - 8.1 g/dL 6.8     Total Bilirubin 0.3 - 1.2 mg/dL 0.7     Alkaline Phos 38 - 126 U/L 100     AST 15 - 41 U/L 12     ALT 0 - 44 U/L 11      No results found for: "CAN153" Lab Results  Component Value Date   WBC 7.5 09/05/2022   HGB 12.0 09/05/2022   HCT 38.3 09/05/2022   MCV 88.7 09/05/2022   PLT 295 09/05/2022   NEUTROABS 4.7 09/05/2022    ASSESSMENT:  Left breast high-grade DCIS, ER positive: - Screening mammogram on 08/12/2020 with asymmetry in the outer left breast in [place], [place]. - Diagnostic mammogram/ultrasound and US biopsy on 09/05/2020, pathology consistent with high-grade DCIS, ER/PR positive. - Lumpectomy and SLN biopsy on 10/03/2020 - Pathology with 1.4 cm DCIS, margins negative, 4/4 lymph nodes negative. - Status post XRT to the left breast - She was reportedly negative for genetic testing.  She had  previous right breast biopsies x4 (1996, 2006, 2021). - She was started on anastrozole, developed nausea, vomiting and abdominal pain - Exemestane started on 01/10/2021, discontinued in the first week of February 2023 secondary to hypertension. - Letrozole started on 07/17/2021. - She has some hot flashes which started after taking anastrozole but never went away.  They are tolerable. - She also has joint pains in the ankles, knees, hips, shoulders and wrists which are chronic and stable.    Social/family history: - She is retired Print production planner.  Recently moved from [place] to [place]. - She smoked 2 packs/day from age 38 through 17 and quit. - Mother had breast cancer at age 67 and 48.  Maternal grandmother had breast cancer at age 25.  Father had cancer on cervical spine.  Primary unknown.   PLAN:  Left breast DCIS, high-grade, ER/PR positive: - Currently on tamoxifen 20 mg daily. Continues to have arthralgias and hot flashes but agreed to continue with current  therapy. - Last mammogram on 08/27/2022 shows stable lumpectomy changes in the left breast, no evidence of malignancy. Repeat mammogram in 1 year.  - RTC in 6 months for repeat labs  2.  Osteopenia (DEXA July 2022 T score -1.4): - She is not taking calcium and vitamin D supplements due to hypercalcemia. -- Last DEXA scan was on July 2022, we will repeat scan around July 2024.  -- Since hypercalcemia has resolved, recommend to start vitamin D supplementation due to vitamin D deficiency seen on labs from 08/27/2022.   3.  Mild hypercalcemia: - Previous workup showed normal vitamin D and 25-hydroxy vitamin D, SPEP, immunofixation, free light chains.  PTH RP was normal.  Intact PTH was 46. - Ultrasound neck (03/21/2022): No parathyroid adenoma.  2.6 cm exophytic ill-defined nodule in the inferior pole of the lower lobe of thyroid. - Thyroid biopsy was Bethesda category 2 with benign follicular nodules. - Nuclear medicine parathyroid scan (05/02/2022): Subtle areas of tracer retention at the inferior pole of the left lobe as well as upper poles of both lobes without definite dominant mass/adenoma identified.  Pattern is nonspecific and could potentially represent parathyroid hyperplasia. - Underwent parathyroidectomy on 07/30/2022 and calcium levels are back to normal  4. Normocytic anemia: -- Mild, with Hgb 11.4, MCV 88.3 -- Repeat labs today to check CBC along with vitamin B12, folate and iron levels  Orders placed this encounter:  Orders Placed This Encounter  Procedures   DG Bone Density   CBC with Differential   Ferritin   Vitamin B12   Iron and TIBC ([facility] ONLY)   Folate     The patient has a good understanding of the overall plan. She agrees with it. She will call with any problems that may develop before the next visit here.   I have spent a total of 30 minutes minutes of face-to-face and non-face-to-face time, preparing to see the patient, performing a medically  appropriate examination, counseling and educating the patient, ordering medications/tests/procedures,documenting clinical information in the electronic health record and care coordination.   Georga Kaufmann PA-C Dept of Hematology and Oncology [facility]

## 2022-09-09 NOTE — Progress Notes (Signed)
Notified patient.

## 2022-09-12 ENCOUNTER — Other Ambulatory Visit: Payer: Self-pay | Admitting: Hematology

## 2022-09-13 NOTE — Telephone Encounter (Signed)
Per last office note, okay to refill femara.

## 2022-11-04 ENCOUNTER — Other Ambulatory Visit: Payer: Self-pay | Admitting: Family Medicine

## 2022-11-04 DIAGNOSIS — K581 Irritable bowel syndrome with constipation: Secondary | ICD-10-CM

## 2022-11-07 ENCOUNTER — Encounter: Payer: Self-pay | Admitting: Family Medicine

## 2022-11-07 ENCOUNTER — Ambulatory Visit (INDEPENDENT_AMBULATORY_CARE_PROVIDER_SITE_OTHER): Payer: Medicare Other | Admitting: Family Medicine

## 2022-11-07 VITALS — BP 138/70 | HR 72 | Temp 97.9°F | Ht 68.0 in | Wt 258.4 lb

## 2022-11-07 DIAGNOSIS — B372 Candidiasis of skin and nail: Secondary | ICD-10-CM

## 2022-11-07 DIAGNOSIS — N898 Other specified noninflammatory disorders of vagina: Secondary | ICD-10-CM | POA: Diagnosis not present

## 2022-11-07 DIAGNOSIS — K581 Irritable bowel syndrome with constipation: Secondary | ICD-10-CM | POA: Diagnosis not present

## 2022-11-07 DIAGNOSIS — E559 Vitamin D deficiency, unspecified: Secondary | ICD-10-CM | POA: Diagnosis not present

## 2022-11-07 DIAGNOSIS — R2232 Localized swelling, mass and lump, left upper limb: Secondary | ICD-10-CM

## 2022-11-07 MED ORDER — NYSTATIN 100000 UNIT/GM EX CREA: 1.0000 | TOPICAL_CREAM | Freq: Two times a day (BID) | CUTANEOUS | 0 refills | Status: DC

## 2022-11-07 NOTE — Progress Notes (Signed)
Subjective:  Patient ID: Amber Diaz, female    DOB: Oct 20, 1955, 67 y.o.   MRN: [id number]  Patient Care Team: Sonny Masters, FNP as PCP - General (Family Medicine) Doreatha Massed, MD as Medical Oncologist (Medical Oncology)   Chief Complaint:  yeast under arm  (Left x 3 days ) and Mass (Left arm that has been there years )   HPI: Amber Diaz is a 67 y.o. female presenting on 11/07/2022 for yeast under arm  (Left x 3 days ) and Mass (Left arm that has been there years )   1. Irritable bowel syndrome with constipation Has been taking 145 mcg and doing well. States recently this has not been beneficial. Has not tried increasing dose. Denies melena or hematochezia. No new or worsening symptoms.   2. Rash Red, pruritic rash to left axilla x 3 days. Has been using topical cream without resolution of symptoms.   3. Arm mass, left Has a soft mass to left upper arm. States this has been present for several years. Has not had imaging in the past.   4. Vaginal dryness States she has vaginal dryness daily. She has tried some topical creams but this has not been beneficial. She has not tried any intravaginal treatments.   5. Vitamin D deficiency Vit D was sent to pharmacy and this was not covered by insurance so she did not start it. She did not get over the counter repletion therapy.      Relevant past medical, surgical, family, and social history reviewed and updated as indicated.  Allergies and medications reviewed and updated. Data reviewed: Chart in Epic.   Past Medical History:  Diagnosis Date   Arthritis    Breast cancer (HCC)    Dyspnea    With exertion at times   GERD (gastroesophageal reflux disease)    Headache    HTN (hypertension)    Psoriasis     Past Surgical History:  Procedure Laterality Date   BREAST LUMPECTOMY Left    BREAST SURGERY     CESAREAN SECTION     EYE SURGERY     PARATHYROIDECTOMY N/A 07/30/2022   Procedure: NECK EXPLORATION WITH  PARATHYROIDECTOMY;  Surgeon: Darnell Level, MD;  Location: [facility];  Service: General;  Laterality: N/A;   TUBAL LIGATION      Social History   Socioeconomic History   Marital status: Married    Spouse name: Not on file   Number of children: 1   Years of education: Not on file   Highest education level: 12th grade  Occupational History   Not on file  Tobacco Use   Smoking status: Former    Packs/day: 2.00    Years: 17.00    Additional pack years: 0.00    Total pack years: 34.00    Types: Cigarettes   Smokeless tobacco: Never  Vaping Use   Vaping Use: Never used  Substance and Sexual Activity   Alcohol use: Not Currently   Drug use: Never   Sexual activity: Not Currently    Birth control/protection: Post-menopausal  Other Topics Concern   Not on file  Social History Narrative   Not on file   Social Determinants of Health   Financial Resource Strain: Low Risk  (11/03/2022)   Overall Financial Resource Strain (CARDIA)    Difficulty of Paying Living Expenses: Not very hard  Food Insecurity: No Food Insecurity (11/03/2022)   Hunger Vital Sign    Worried About Running Out of Food  in the Last Year: Never true    Ran Out of Food in the Last Year: Never true  Transportation Needs: No Transportation Needs (11/03/2022)   PRAPARE - Administrator, Civil Service (Medical): No    Lack of Transportation (Non-Medical): No  Physical Activity: Inactive (11/03/2022)   Exercise Vital Sign    Days of Exercise per Week: 0 days    Minutes of Exercise per Session: 0 min  Stress: No Stress Concern Present (11/03/2022)   [employer] of Occupational Health - Occupational Stress Questionnaire    Feeling of Stress : Only a little  Social Connections: Moderately Isolated (11/03/2022)   Social Connection and Isolation Panel [NHANES]    Frequency of Communication with Friends and Family: More than three times a week    Frequency of Social Gatherings with Friends and Family: Once a week     Attends Religious Services: Never    Database administrator or Organizations: No    Attends Banker Meetings: Never    Marital Status: Married  Catering manager Violence: Not At Risk (07/30/2022)   Humiliation, Afraid, Rape, and Kick questionnaire    Fear of Current or Ex-Partner: No    Emotionally Abused: No    Physically Abused: No    Sexually Abused: No    Outpatient Encounter Medications as of 11/07/2022  Medication Sig   acetaminophen (TYLENOL) 325 MG tablet Take 650 mg by mouth every 6 (six) hours as needed for moderate pain.   baclofen (LIORESAL) 10 MG tablet Take 1 tablet (10 mg total) by mouth 3 (three) times daily as needed.   betamethasone dipropionate 0.05 % cream Apply 1 Application topically daily.   linaclotide (LINZESS) 145 MCG CAPS capsule TAKE 1 CAPSULE BY MOUTH DAILY  BEFORE BREAKFAST   montelukast (SINGULAIR) 10 MG tablet TAKE 1 TABLET BY MOUTH IN THE  EVENING (Patient taking differently: Take 10 mg by mouth daily as needed (allergies).)   nystatin cream (MYCOSTATIN) Apply 1 Application topically 2 (two) times daily.   olmesartan (BENICAR) 40 MG tablet TAKE 1 TABLET BY MOUTH DAILY   omeprazole (PRILOSEC) 40 MG capsule Take 1 capsule (40 mg total) by mouth daily.   tamoxifen (NOLVADEX) 20 MG tablet Take 1 tablet (20 mg total) by mouth daily.   [DISCONTINUED] atorvastatin (LIPITOR) 20 MG tablet    [DISCONTINUED] nystatin cream (MYCOSTATIN) Apply 1 Application topically 2 (two) times daily.   [DISCONTINUED] cholecalciferol (VITAMIN D3) 25 MCG (1000 UNIT) tablet Take 1 tablet (1,000 Units total) by mouth daily.   [DISCONTINUED] letrozole (FEMARA) 2.5 MG tablet TAKE 1 TABLET BY MOUTH DAILY   No facility-administered encounter medications on file as of 11/07/2022.    No Known Allergies  Review of Systems  Constitutional:  Positive for activity change and fatigue. Negative for appetite change, chills, diaphoresis, fever and unexpected weight change.  HENT:  Negative.    Eyes: Negative.  Negative for photophobia and visual disturbance.  Respiratory:  Negative for cough, chest tightness and shortness of breath.   Cardiovascular:  Negative for chest pain, palpitations and leg swelling.  Gastrointestinal:  Negative for abdominal pain, blood in stool, constipation, diarrhea, nausea and vomiting.  Endocrine: Negative.   Genitourinary:  Negative for decreased urine volume, difficulty urinating, dysuria, frequency and urgency.       Vaginal dryness  Musculoskeletal:  Negative for arthralgias and myalgias.  Skin:  Positive for color change and rash. Negative for pallor and wound.  Allergic/Immunologic: Negative.  Neurological:  Negative for dizziness, tremors, seizures, syncope, facial asymmetry, speech difficulty, weakness, light-headedness, numbness and headaches.  Hematological: Negative.   Psychiatric/Behavioral:  Negative for confusion, hallucinations, sleep disturbance and suicidal ideas.   All other systems reviewed and are negative.       Objective:  BP 138/70   Pulse 72   Temp 97.9 F (36.6 C) (Temporal)   Ht 5\' 8"  (1.727 m)   Wt 258 lb 6.4 oz (117.2 kg)   SpO2 94%   BMI 39.29 kg/m    Wt Readings from Last 3 Encounters:  11/07/22 258 lb 6.4 oz (117.2 kg)  09/05/22 257 lb 12.8 oz (116.9 kg)  07/30/22 256 lb 6.4 oz (116.3 kg)    Physical Exam Vitals and nursing note reviewed.  Constitutional:      General: She is not in acute distress.    Appearance: Normal appearance. She is morbidly obese. She is not ill-appearing, toxic-appearing or diaphoretic.  HENT:     Head: Normocephalic and atraumatic.  Eyes:     Conjunctiva/sclera: Conjunctivae normal.     Pupils: Pupils are equal, round, and reactive to light.  Cardiovascular:     Rate and Rhythm: Normal rate and regular rhythm.     Heart sounds: Normal heart sounds.  Pulmonary:     Effort: Pulmonary effort is normal.     Breath sounds: Normal breath sounds.   Musculoskeletal:     Right lower leg: No edema.     Left lower leg: No edema.  Skin:    General: Skin is warm and dry.     Capillary Refill: Capillary refill takes less than 2 seconds.          Comments: Left axilla with beefy red rash with satellite lesions. No drainage.   Neurological:     General: No focal deficit present.     Mental Status: She is alert and oriented to person, place, and time.  Psychiatric:        Mood and Affect: Mood normal.        Behavior: Behavior normal. Behavior is cooperative.        Thought Content: Thought content normal.        Judgment: Judgment normal.     Results for orders placed or performed in visit on 09/05/22  Folate  Result Value Ref Range   Folate 8.0 >5.9 ng/mL  Iron and TIBC ([facility] ONLY)  Result Value Ref Range   Iron 71 28 - 170 ug/dL   TIBC 161 096 - 045 ug/dL   Saturation Ratios 22 10.4 - 31.8 %   UIBC 255 ug/dL  Vitamin W09  Result Value Ref Range   Vitamin B-12 224 180 - 914 pg/mL  Ferritin  Result Value Ref Range   Ferritin 170 11 - 307 ng/mL  CBC with Differential  Result Value Ref Range   WBC 7.5 4.0 - 10.5 K/uL   RBC 4.32 3.87 - 5.11 MIL/uL   Hemoglobin 12.0 12.0 - 15.0 g/dL   HCT 81.1 91.4 - 78.2 %   MCV 88.7 80.0 - 100.0 fL   MCH 27.8 26.0 - 34.0 pg   MCHC 31.3 30.0 - 36.0 g/dL   RDW 95.6 21.3 - 08.6 %   Platelets 295 150 - 400 K/uL   nRBC 0.0 0.0 - 0.2 %   Neutrophils Relative % 64 %   Neutro Abs 4.7 1.7 - 7.7 K/uL   Lymphocytes Relative 26 %   Lymphs Abs 2.0 0.7 -  4.0 K/uL   Monocytes Relative 6 %   Monocytes Absolute 0.5 0.1 - 1.0 K/uL   Eosinophils Relative 3 %   Eosinophils Absolute 0.2 0.0 - 0.5 K/uL   Basophils Relative 1 %   Basophils Absolute 0.1 0.0 - 0.1 K/uL   Immature Granulocytes 0 %   Abs Immature Granulocytes 0.03 0.00 - 0.07 K/uL  VITAMIN D 25 Hydroxy (Vit-D Deficiency, Fractures)  Result Value Ref Range   Vit D, 25-Hydroxy 22.82 (L) 30 - 100 ng/mL   Comprehensive metabolic panel  Result Value Ref Range   Sodium 138 135 - 145 mmol/L   Potassium 3.7 3.5 - 5.1 mmol/L   Chloride 105 98 - 111 mmol/L   CO2 26 22 - 32 mmol/L   Glucose, Bld 92 70 - 99 mg/dL   BUN 16 8 - 23 mg/dL   Creatinine, Ser 3.08 0.44 - 1.00 mg/dL   Calcium 9.0 8.9 - 65.7 mg/dL   Total Protein 7.3 6.5 - 8.1 g/dL   Albumin 3.5 3.5 - 5.0 g/dL   AST 15 15 - 41 U/L   ALT 13 0 - 44 U/L   Alkaline Phosphatase 98 38 - 126 U/L   Total Bilirubin 0.7 0.3 - 1.2 mg/dL   GFR, Estimated >84 >69 mL/min   Anion gap 7 5 - 15       Pertinent labs & imaging results that were available during my care of the patient were reviewed by me and considered in my medical decision making.  Assessment & Plan:  Amber Diaz was seen today for yeast under arm  and mass.  Diagnoses and all orders for this visit:  Irritable bowel syndrome with constipation Currently on 145 mcg. This was working well at first but is no longer working. Can double dose and see if beneficial, if so, please let provider know.   Candidal intertrigo Treatment discussed in detail. Prevention discussed in detail. Report new, worsening, or persistent symptoms.  -     nystatin cream (MYCOSTATIN); Apply 1 Application topically 2 (two) times daily.  Arm mass, left Likely a lipoma. Will obtain imaging for further evaluation.  -     [place] LT UPPER EXTREM LTD SOFT TISSUE NON VASCULAR; Future  Vaginal dryness Pt to trial Luvena over the counter. Report new, worsening, or persistent symptoms.   Vitamin D deficiency Has not started Vit D repletion therapy. Discussed dosing today. Will follow up in 3 months for repeat labs.     Continue all other maintenance medications.  Follow up plan: Return in about 3 months (around 02/07/2023), or if symptoms worsen or fail to improve, for chronic follow up.   Continue healthy lifestyle choices, including diet (rich in fruits, vegetables, and lean proteins, and low in salt and simple  carbohydrates) and exercise (at least 30 minutes of moderate physical activity daily).   The above assessment and management plan was discussed with the patient. The patient verbalized understanding of and has agreed to the management plan. Patient is aware to call the clinic if they develop any new symptoms or if symptoms persist or worsen. Patient is aware when to return to the clinic for a follow-up visit. Patient educated on when it is appropriate to go to the emergency department.   Kari Baars, FNP-C [facility] [phone]

## 2022-11-07 NOTE — Patient Instructions (Addendum)
Luvena  Vit D3 2000 iu, 2 capsules daily for 1 month, then 1 capsule daily.

## 2022-11-15 ENCOUNTER — Encounter: Payer: Self-pay | Admitting: Family Medicine

## 2022-11-20 ENCOUNTER — Ambulatory Visit (HOSPITAL_COMMUNITY)
Admission: RE | Admit: 2022-11-20 | Discharge: 2022-11-20 | Disposition: A | Discharge status: Home / Self Care | Payer: Medicare Other | Source: Ambulatory Visit | Attending: Family Medicine | Admitting: Family Medicine

## 2022-11-20 DIAGNOSIS — R2232 Localized swelling, mass and lump, left upper limb: Secondary | ICD-10-CM | POA: Insufficient documentation

## 2022-12-30 ENCOUNTER — Ambulatory Visit (HOSPITAL_COMMUNITY)
Admission: RE | Admit: 2022-12-30 | Discharge: 2022-12-30 | Disposition: A | Discharge status: Home / Self Care | Payer: Medicare Other | Source: Ambulatory Visit | Attending: Physician Assistant | Admitting: Physician Assistant

## 2022-12-30 DIAGNOSIS — D0512 Intraductal carcinoma in situ of left breast: Secondary | ICD-10-CM | POA: Insufficient documentation

## 2022-12-30 DIAGNOSIS — M85852 Other specified disorders of bone density and structure, left thigh: Secondary | ICD-10-CM | POA: Insufficient documentation

## 2022-12-30 DIAGNOSIS — Z13820 Encounter for screening for osteoporosis: Secondary | ICD-10-CM | POA: Diagnosis not present

## 2023-01-02 ENCOUNTER — Other Ambulatory Visit: Payer: Self-pay | Admitting: Physician Assistant

## 2023-01-03 ENCOUNTER — Encounter: Payer: Self-pay | Admitting: Physician Assistant

## 2023-01-03 NOTE — Progress Notes (Signed)
Bone density scan reviewed.  T-score -1.1, improved from previous but remains osteopenic.  No indication for treatment at this time.  Patient to continue vitamin D and weightbearing exercises.  No calcium supplements due to history of hypercalcemia.  The above information was communicated to patient by LPN Renold Genta, at my request.  Carnella Guadalajara, PA-C 01/03/23 5:51 PM

## 2023-01-22 ENCOUNTER — Other Ambulatory Visit: Payer: Self-pay | Admitting: Family Medicine

## 2023-01-22 DIAGNOSIS — K219 Gastro-esophageal reflux disease without esophagitis: Secondary | ICD-10-CM

## 2023-02-04 ENCOUNTER — Other Ambulatory Visit: Payer: Medicare Other

## 2023-02-04 DIAGNOSIS — Z9889 Other specified postprocedural states: Secondary | ICD-10-CM | POA: Diagnosis not present

## 2023-02-04 DIAGNOSIS — Z9089 Acquired absence of other organs: Secondary | ICD-10-CM | POA: Diagnosis not present

## 2023-02-04 DIAGNOSIS — E559 Vitamin D deficiency, unspecified: Secondary | ICD-10-CM | POA: Diagnosis not present

## 2023-02-04 DIAGNOSIS — E782 Mixed hyperlipidemia: Secondary | ICD-10-CM | POA: Diagnosis not present

## 2023-02-04 DIAGNOSIS — Z6839 Body mass index (BMI) 39.0-39.9, adult: Secondary | ICD-10-CM

## 2023-02-05 LAB — CBC WITH DIFFERENTIAL/PLATELET
Basophils Absolute: 0.1 10*3/uL (ref 0.0–0.2)
Basos: 1 %
EOS (ABSOLUTE): 0.2 10*3/uL (ref 0.0–0.4)
Eos: 3 %
Hematocrit: 36.6 % (ref 34.0–46.6)
Hemoglobin: 11.6 g/dL (ref 11.1–15.9)
Immature Grans (Abs): 0 10*3/uL (ref 0.0–0.1)
Immature Granulocytes: 0 %
Lymphocytes Absolute: 1.9 10*3/uL (ref 0.7–3.1)
Lymphs: 27 %
MCH: 28.3 pg (ref 26.6–33.0)
MCHC: 31.7 g/dL (ref 31.5–35.7)
MCV: 89 fL (ref 79–97)
Monocytes Absolute: 0.5 10*3/uL (ref 0.1–0.9)
Monocytes: 8 %
Neutrophils Absolute: 4.3 10*3/uL (ref 1.4–7.0)
Neutrophils: 61 %
Platelets: 277 10*3/uL (ref 150–450)
RBC: 4.1 x10E6/uL (ref 3.77–5.28)
RDW: 13.6 % (ref 11.7–15.4)
WBC: 7 10*3/uL (ref 3.4–10.8)

## 2023-02-05 LAB — CMP14+EGFR
ALT: 20 IU/L (ref 0–32)
AST: 20 IU/L (ref 0–40)
Albumin: 3.8 g/dL — ABNORMAL LOW (ref 3.9–4.9)
Alkaline Phosphatase: 102 IU/L (ref 44–121)
BUN/Creatinine Ratio: 14 (ref 12–28)
BUN: 13 mg/dL (ref 8–27)
Bilirubin Total: 0.4 mg/dL (ref 0.0–1.2)
CO2: 23 mmol/L (ref 20–29)
Calcium: 9.5 mg/dL (ref 8.7–10.3)
Chloride: 105 mmol/L (ref 96–106)
Creatinine, Ser: 0.93 mg/dL (ref 0.57–1.00)
Globulin, Total: 2.8 g/dL (ref 1.5–4.5)
Glucose: 103 mg/dL — ABNORMAL HIGH (ref 70–99)
Potassium: 4.2 mmol/L (ref 3.5–5.2)
Sodium: 140 mmol/L (ref 134–144)
Total Protein: 6.6 g/dL (ref 6.0–8.5)
eGFR: 68 mL/min/{1.73_m2} (ref >59)

## 2023-02-05 LAB — LIPID PANEL
Chol/HDL Ratio: 6.1 ratio — ABNORMAL HIGH (ref 0.0–4.4)
Cholesterol, Total: 225 mg/dL — ABNORMAL HIGH (ref 100–199)
HDL: 37 mg/dL — ABNORMAL LOW (ref >39)
LDL Chol Calc (NIH): 154 mg/dL — ABNORMAL HIGH (ref 0–99)
Triglycerides: 187 mg/dL — ABNORMAL HIGH (ref 0–149)
VLDL Cholesterol Cal: 34 mg/dL (ref 5–40)

## 2023-02-05 LAB — THYROID PANEL WITH TSH
Free Thyroxine Index: 1.5 (ref 1.2–4.9)
T3 Uptake Ratio: 25 % (ref 24–39)
T4, Total: 5.8 ug/dL (ref 4.5–12.0)
TSH: 1.96 u[IU]/mL (ref 0.450–4.500)

## 2023-02-05 LAB — VITAMIN D 25 HYDROXY (VIT D DEFICIENCY, FRACTURES): Vit D, 25-Hydroxy: 28.3 ng/mL — ABNORMAL LOW (ref 30.0–100.0)

## 2023-02-07 ENCOUNTER — Encounter: Payer: Self-pay | Admitting: Family Medicine

## 2023-02-07 ENCOUNTER — Ambulatory Visit (INDEPENDENT_AMBULATORY_CARE_PROVIDER_SITE_OTHER): Payer: Medicare Other | Admitting: Family Medicine

## 2023-02-07 VITALS — BP 127/76 | HR 80 | Temp 98.0°F | Ht 68.0 in | Wt 257.6 lb

## 2023-02-07 DIAGNOSIS — K219 Gastro-esophageal reflux disease without esophagitis: Secondary | ICD-10-CM

## 2023-02-07 DIAGNOSIS — N3941 Urge incontinence: Secondary | ICD-10-CM

## 2023-02-07 DIAGNOSIS — I10 Essential (primary) hypertension: Secondary | ICD-10-CM

## 2023-02-07 DIAGNOSIS — K5909 Other constipation: Secondary | ICD-10-CM

## 2023-02-07 DIAGNOSIS — Z9109 Other allergy status, other than to drugs and biological substances: Secondary | ICD-10-CM | POA: Diagnosis not present

## 2023-02-07 LAB — URINALYSIS, ROUTINE W REFLEX MICROSCOPIC
Bilirubin, UA: NEGATIVE
Glucose, UA: NEGATIVE
Ketones, UA: NEGATIVE
Leukocytes,UA: NEGATIVE
Nitrite, UA: NEGATIVE
Protein,UA: NEGATIVE
RBC, UA: NEGATIVE
Specific Gravity, UA: 1.02 (ref 1.005–1.030)
Urobilinogen, Ur: 0.2 mg/dL (ref 0.2–1.0)
pH, UA: 5.5 (ref 5.0–7.5)

## 2023-02-07 MED ORDER — OMEPRAZOLE 40 MG PO CPDR
40.0000 mg | DELAYED_RELEASE_CAPSULE | Freq: Every day | ORAL | 1 refills | Status: DC
Start: 2023-02-07 — End: 2023-08-11

## 2023-02-07 MED ORDER — SOLIFENACIN SUCCINATE 5 MG PO TABS: 5.0000 mg | ORAL_TABLET | Freq: Every day | ORAL | 0 refills | Status: DC

## 2023-02-07 MED ORDER — FUROSEMIDE 20 MG PO TABS
20.0000 mg | ORAL_TABLET | Freq: Every day | ORAL | 3 refills | Status: DC
Start: 2023-02-07 — End: 2023-04-17

## 2023-02-07 NOTE — Progress Notes (Signed)
Subjective:  Patient ID: Amber Diaz, female    DOB: 1955/06/23, 67 y.o.   MRN: [id number]  Patient Care Team: Sonny Masters, FNP as PCP - General (Family Medicine) Doreatha Massed, MD as Medical Oncologist (Medical Oncology)   Chief Complaint:  Medical Management of Chronic Issues (3 month chronic follow up )   HPI: Amber Diaz is a 67 y.o. female presenting on 02/07/2023 for Medical Management of Chronic Issues (3 month chronic follow up )    1. Environmental allergies Patient states she stopped taking her Singulair as she did not know which medication may be contributing to her recurrent constipation.  She does have some sneezing, cough, and nasal congestion at times but nothing new or worsening.  2. Gastroesophageal reflux disease without esophagitis Patient is on PPI therapy and tolerating well.  No red flags concerning for esophagitis such as dysphagia, voice changes, cough, weight loss, fever, chills, or night sweats.  No hemoptysis, melena or hematochezia.  3. Other constipation Has had issues of ongoing constipation for several months.  Has tried some medications without relief of symptoms.  Denies nausea, vomiting, abdominal pain, or melena/hematochezia.  Has not been evaluated by GI.  4. Urge incontinence Ongoing urge incontinence for several years.  Has never had treatment for this in the past.  Denies other associated symptoms.  States when she has to get evaluated she has to get up and go and majority of the time she has some incontinence of urine.  5. Primary hypertension Patient is on olmesartan.  Lasix was discontinued in the past and patient reports since the discontinuation of this medication she is experiencing increased constipation.  States that she does have some slight leg swelling at times.     Relevant past medical, surgical, family, and social history reviewed and updated as indicated.  Allergies and medications reviewed and updated. Data  reviewed: Chart in Epic.   Past Medical History:  Diagnosis Date   Arthritis    Breast cancer (HCC)    Dyspnea    With exertion at times   GERD (gastroesophageal reflux disease)    Headache    HTN (hypertension)    Psoriasis     Past Surgical History:  Procedure Laterality Date   BREAST LUMPECTOMY Left    BREAST SURGERY     CESAREAN SECTION     EYE SURGERY     PARATHYROIDECTOMY N/A 07/30/2022   Procedure: NECK EXPLORATION WITH PARATHYROIDECTOMY;  Surgeon: Darnell Level, MD;  Location: [facility];  Service: General;  Laterality: N/A;   TUBAL LIGATION      Social History   Socioeconomic History   Marital status: Married    Spouse name: Not on file   Number of children: 1   Years of education: Not on file   Highest education level: 12th grade  Occupational History   Not on file  Tobacco Use   Smoking status: Former    Current packs/day: 2.00    Average packs/day: 2.0 packs/day for 17.0 years (34.0 ttl pk-yrs)    Types: Cigarettes   Smokeless tobacco: Never  Vaping Use   Vaping status: Never Used  Substance and Sexual Activity   Alcohol use: Not Currently   Drug use: Never   Sexual activity: Not Currently    Birth control/protection: Post-menopausal  Other Topics Concern   Not on file  Social History Narrative   Not on file   Social Determinants of Health   Financial Resource Strain: Low Risk  (  11/03/2022)   Overall Financial Resource Strain (CARDIA)    Difficulty of Paying Living Expenses: Not very hard  Food Insecurity: No Food Insecurity (11/03/2022)   Hunger Vital Sign    Worried About Running Out of Food in the Last Year: Never true    Ran Out of Food in the Last Year: Never true  Transportation Needs: No Transportation Needs (11/03/2022)   PRAPARE - Administrator, Civil Service (Medical): No    Lack of Transportation (Non-Medical): No  Physical Activity: Inactive (11/03/2022)   Exercise Vital Sign    Days of Exercise per Week: 0 days    Minutes of  Exercise per Session: 0 min  Stress: No Stress Concern Present (11/03/2022)   [employer] of Occupational Health - Occupational Stress Questionnaire    Feeling of Stress : Only a little  Social Connections: Moderately Isolated (11/03/2022)   Social Connection and Isolation Panel [NHANES]    Frequency of Communication with Friends and Family: More than three times a week    Frequency of Social Gatherings with Friends and Family: Once a week    Attends Religious Services: Never    Database administrator or Organizations: No    Attends Banker Meetings: Never    Marital Status: Married  Catering manager Violence: Not At Risk (07/30/2022)   Humiliation, Afraid, Rape, and Kick questionnaire    Fear of Current or Ex-Partner: No    Emotionally Abused: No    Physically Abused: No    Sexually Abused: No    Outpatient Encounter Medications as of 02/07/2023  Medication Sig   furosemide (LASIX) 20 MG tablet Take 1 tablet (20 mg total) by mouth daily.   olmesartan (BENICAR) 40 MG tablet TAKE 1 TABLET BY MOUTH DAILY   solifenacin (VESICARE) 5 MG tablet Take 1 tablet (5 mg total) by mouth daily.   [DISCONTINUED] acetaminophen (TYLENOL) 325 MG tablet Take 650 mg by mouth every 6 (six) hours as needed for moderate pain.   [DISCONTINUED] baclofen (LIORESAL) 10 MG tablet Take 1 tablet (10 mg total) by mouth 3 (three) times daily as needed.   [DISCONTINUED] betamethasone dipropionate 0.05 % cream Apply 1 Application topically daily.   [DISCONTINUED] nystatin cream (MYCOSTATIN) Apply 1 Application topically 2 (two) times daily.   [DISCONTINUED] omeprazole (PRILOSEC) 40 MG capsule TAKE 1 CAPSULE BY MOUTH DAILY   omeprazole (PRILOSEC) 40 MG capsule Take 1 capsule (40 mg total) by mouth daily.   [DISCONTINUED] linaclotide (LINZESS) 145 MCG CAPS capsule TAKE 1 CAPSULE BY MOUTH DAILY  BEFORE BREAKFAST (Patient not taking: Reported on 02/07/2023)   [DISCONTINUED] montelukast (SINGULAIR) 10 MG tablet  TAKE 1 TABLET BY MOUTH IN THE  EVENING (Patient not taking: Reported on 02/07/2023)   [DISCONTINUED] tamoxifen (NOLVADEX) 20 MG tablet Take 1 tablet (20 mg total) by mouth daily. (Patient not taking: Reported on 02/07/2023)   No facility-administered encounter medications on file as of 02/07/2023.    No Known Allergies  Review of Systems  Constitutional:  Negative for activity change, appetite change, chills, diaphoresis, fatigue, fever and unexpected weight change.  HENT: Negative.    Eyes: Negative.  Negative for photophobia and visual disturbance.  Respiratory:  Negative for cough, chest tightness and shortness of breath.   Cardiovascular:  Negative for chest pain, palpitations and leg swelling.  Gastrointestinal:  Negative for blood in stool, constipation, diarrhea, nausea and vomiting.  Endocrine: Negative.   Genitourinary:  Negative for decreased urine volume, difficulty urinating, dysuria, enuresis,  flank pain, frequency, genital sores, hematuria, pelvic pain, urgency, vaginal bleeding and vaginal pain.       Urge incontinence  Musculoskeletal:  Negative for arthralgias and myalgias.  Skin: Negative.   Allergic/Immunologic: Negative.   Neurological:  Negative for dizziness, tremors, seizures, syncope, facial asymmetry, speech difficulty, weakness, light-headedness, numbness and headaches.  Hematological: Negative.   Psychiatric/Behavioral:  Negative for confusion, hallucinations, sleep disturbance and suicidal ideas.   All other systems reviewed and are negative.       Objective:  BP 127/76   Pulse 80   Temp 98 F (36.7 C) (Temporal)   Ht 5\' 8"  (1.727 m)   Wt 257 lb 9.6 oz (116.8 kg)   SpO2 94%   BMI 39.17 kg/m    Wt Readings from Last 3 Encounters:  02/07/23 257 lb 9.6 oz (116.8 kg)  11/07/22 258 lb 6.4 oz (117.2 kg)  09/05/22 257 lb 12.8 oz (116.9 kg)    Physical Exam Vitals and nursing note reviewed.  Constitutional:      General: She is not in acute  distress.    Appearance: Normal appearance. She is morbidly obese. She is not ill-appearing, toxic-appearing or diaphoretic.  HENT:     Head: Normocephalic and atraumatic.     Nose: Nose normal.     Mouth/Throat:     Mouth: Mucous membranes are moist.     Pharynx: Oropharynx is clear.  Eyes:     Conjunctiva/sclera: Conjunctivae normal.     Pupils: Pupils are equal, round, and reactive to light.  Cardiovascular:     Rate and Rhythm: Normal rate and regular rhythm.     Heart sounds: Normal heart sounds.  Pulmonary:     Effort: Pulmonary effort is normal.     Breath sounds: Normal breath sounds.  Abdominal:     Palpations: Abdomen is soft.  Musculoskeletal:     Cervical back: Normal range of motion and neck supple.     Right lower leg: No edema.     Left lower leg: No edema.  Skin:    General: Skin is warm and dry.     Capillary Refill: Capillary refill takes less than 2 seconds.       Neurological:     General: No focal deficit present.     Mental Status: She is alert and oriented to person, place, and time.  Psychiatric:        Mood and Affect: Mood normal.        Behavior: Behavior normal. Behavior is cooperative.        Thought Content: Thought content normal.        Judgment: Judgment normal.     Results for orders placed or performed in visit on 02/07/23  Urinalysis, Routine w reflex microscopic  Result Value Ref Range   Specific Gravity, UA 1.020 1.005 - 1.030   pH, UA 5.5 5.0 - 7.5   Color, UA Yellow Yellow   Appearance Ur Clear Clear   Leukocytes,UA Negative Negative   Protein,UA Negative Negative/Trace   Glucose, UA Negative Negative   Ketones, UA Negative Negative   RBC, UA Negative Negative   Bilirubin, UA Negative Negative   Urobilinogen, Ur 0.2 0.2 - 1.0 mg/dL   Nitrite, UA Negative Negative       Pertinent labs & imaging results that were available during my care of the patient were reviewed by me and considered in my medical decision  making.  Assessment & Plan:  Gloretta was seen today for  medical management of chronic issues.  Diagnoses and all orders for this visit:  Gastroesophageal reflux disease without esophagitis No red flags present. Diet discussed. Avoid fried, spicy, fatty, greasy, and acidic foods. Avoid caffeine, nicotine, and alcohol. Do not eat 2-3 hours before bedtime and stay upright for at least 1-2 hours after eating. Eat small frequent meals. Avoid NSAID's like motrin and aleve. Medications as prescribed. Report any new or worsening symptoms. Follow up as discussed or sooner if needed.   -     omeprazole (PRILOSEC) 40 MG capsule; Take 1 capsule (40 mg total) by mouth daily. -     Ambulatory referral to Gastroenterology  Environmental allergies Has stopped her Singulair and does not wish to restart.  States she is doing okay for now.  Other constipation Ongoing.  Has been using Linzess for relief.  Has not dilated by GI, will place referral today.  She will -     Ambulatory referral to Gastroenterology  Urge incontinence Urinalysis unremarkable in office.  Will try Vesicare as prescribed. -     Urinalysis, Routine w reflex microscopic -     solifenacin (VESICARE) 5 MG tablet; Take 1 tablet (5 mg total) by mouth daily.  Primary hypertension Blood pressure is well-controlled patient feels that she has some lower extremity swelling.  Will restart Lasix as below.  Monitor blood pressure at home for any persistent high or low readings.  Continue olmesartan. -     furosemide (LASIX) 20 MG tablet; Take 1 tablet (20 mg total) by mouth daily.   Discussed importance of medication compliance and treatment regimen compliance.  Continue all other maintenance medications.  Follow up plan: Return in 3 months (on 05/09/2023), or if symptoms worsen or fail to improve.   Continue healthy lifestyle choices, including diet (rich in fruits, vegetables, and lean proteins, and low in salt and simple carbohydrates) and  exercise (at least 30 minutes of moderate physical activity daily).   The above assessment and management plan was discussed with the patient. The patient verbalized understanding of and has agreed to the management plan. Patient is aware to call the clinic if they develop any new symptoms or if symptoms persist or worsen. Patient is aware when to return to the clinic for a follow-up visit. Patient educated on when it is appropriate to go to the emergency department.   Kari Baars, FNP-C [facility] [phone]

## 2023-02-13 ENCOUNTER — Encounter: Payer: Self-pay | Admitting: Internal Medicine

## 2023-02-13 ENCOUNTER — Ambulatory Visit: Payer: Medicare Other | Admitting: Internal Medicine

## 2023-02-13 VITALS — BP 132/74 | HR 105 | Temp 98.4°F | Ht 68.0 in | Wt 258.0 lb

## 2023-02-13 DIAGNOSIS — K219 Gastro-esophageal reflux disease without esophagitis: Secondary | ICD-10-CM | POA: Diagnosis not present

## 2023-02-13 DIAGNOSIS — K581 Irritable bowel syndrome with constipation: Secondary | ICD-10-CM

## 2023-02-13 DIAGNOSIS — R1032 Left lower quadrant pain: Secondary | ICD-10-CM

## 2023-02-13 NOTE — Patient Instructions (Signed)
Continue on omeprazole for your chronic acid reflux.  For your irritable bowel syndrome with constipation, I will give you samples of Trulance to take 1 time daily.  You may have an initial washout with this medication, continue to take every day.  This should improve.  Let me know in 1 to 2 weeks how you are doing and if improved I will send in formal prescription.  You will need to have Cologuard performed every 3 years for colon cancer screening.  Follow-up in 3 months  It was very nice meeting you today.  Dr. Marletta Lor

## 2023-02-13 NOTE — Progress Notes (Signed)
Primary Care Physician:  Sonny Masters, FNP Primary Gastroenterologist:  Dr. Marletta Lor  Chief Complaint  Patient presents with   New Patient (Initial Visit)    Pt referred for GERD and constipation    HPI:   Amber Diaz is a 67 y.o. female who presents to clinic today by referral from her PCP Gilford Silvius for evaluation.  Multiple GI complaints for me today.  Chronic GERD: Currently taking omeprazole daily with good symptom control.  Occasional breakthrough symptoms depending on what she eats.  No dysphagia odynophagia, no epigastric or chest pain.  No melena hematochezia.  Constipation/abdominal pain: Chronic issue, has been on Linzess 145 mcg x 1 year.  Never took this every day as cause significant bowel movements.  Takes every few days.  Now it is unaffordable at $300.  Believes it is not working as well anyway.  Notes associated left lower quadrant abdominal pain, mild, intermittent, improved with bowel movements.  Colon cancer screening: States she had a colonoscopy over 10 years ago which was normal.  Completed Cologuard last year which was negative.  No family history of colorectal malignancy.  No unintentional weight loss.  Past Medical History:  Diagnosis Date   Arthritis    Breast cancer (HCC)    Dyspnea    With exertion at times   GERD (gastroesophageal reflux disease)    Headache    HTN (hypertension)    Psoriasis     Past Surgical History:  Procedure Laterality Date   BREAST LUMPECTOMY Left    BREAST SURGERY     CESAREAN SECTION     EYE SURGERY     PARATHYROIDECTOMY N/A 07/30/2022   Procedure: NECK EXPLORATION WITH PARATHYROIDECTOMY;  Surgeon: Darnell Level, MD;  Location: [facility];  Service: General;  Laterality: N/A;   TUBAL LIGATION      Current Outpatient Medications  Medication Sig Dispense Refill   furosemide (LASIX) 20 MG tablet Take 1 tablet (20 mg total) by mouth daily. 30 tablet 3   olmesartan (BENICAR) 40 MG tablet TAKE 1 TABLET BY MOUTH DAILY 100  tablet 2   omeprazole (PRILOSEC) 40 MG capsule Take 1 capsule (40 mg total) by mouth daily. 100 capsule 1   Red Yeast Rice 600 MG CAPS Take by mouth.     solifenacin (VESICARE) 5 MG tablet Take 1 tablet (5 mg total) by mouth daily. 90 tablet 0   No current facility-administered medications for this visit.    Allergies as of 02/13/2023   (No Known Allergies)    Family History  Problem Relation Age of Onset   Breast cancer Mother    Heart attack Mother    Bone cancer Father    Breast cancer Maternal Grandmother    Obesity Son     Social History   Socioeconomic History   Marital status: Married    Spouse name: Not on file   Number of children: 1   Years of education: Not on file   Highest education level: 12th grade  Occupational History   Not on file  Tobacco Use   Smoking status: Former    Current packs/day: 2.00    Average packs/day: 2.0 packs/day for 17.0 years (34.0 ttl pk-yrs)    Types: Cigarettes   Smokeless tobacco: Never  Vaping Use   Vaping status: Never Used  Substance and Sexual Activity   Alcohol use: Not Currently   Drug use: Never   Sexual activity: Not Currently    Birth control/protection: Post-menopausal  Other Topics Concern   Not on file  Social History Narrative   Not on file   Social Determinants of Health   Financial Resource Strain: Low Risk  (11/03/2022)   Overall Financial Resource Strain (CARDIA)    Difficulty of Paying Living Expenses: Not very hard  Food Insecurity: No Food Insecurity (11/03/2022)   Hunger Vital Sign    Worried About Running Out of Food in the Last Year: Never true    Ran Out of Food in the Last Year: Never true  Transportation Needs: No Transportation Needs (11/03/2022)   PRAPARE - Administrator, Civil Service (Medical): No    Lack of Transportation (Non-Medical): No  Physical Activity: Inactive (11/03/2022)   Exercise Vital Sign    Days of Exercise per Week: 0 days    Minutes of Exercise per Session: 0  min  Stress: No Stress Concern Present (11/03/2022)   [employer] of Occupational Health - Occupational Stress Questionnaire    Feeling of Stress : Only a little  Social Connections: Moderately Isolated (11/03/2022)   Social Connection and Isolation Panel [NHANES]    Frequency of Communication with Friends and Family: More than three times a week    Frequency of Social Gatherings with Friends and Family: Once a week    Attends Religious Services: Never    Database administrator or Organizations: No    Attends Banker Meetings: Never    Marital Status: Married  Catering manager Violence: Not At Risk (07/30/2022)   Humiliation, Afraid, Rape, and Kick questionnaire    Fear of Current or Ex-Partner: No    Emotionally Abused: No    Physically Abused: No    Sexually Abused: No    Subjective: Review of Systems  Constitutional:  Negative for chills and fever.  HENT:  Negative for congestion and hearing loss.   Eyes:  Negative for blurred vision and double vision.  Respiratory:  Negative for cough and shortness of breath.   Cardiovascular:  Negative for chest pain and palpitations.  Gastrointestinal:  Positive for abdominal pain, constipation and heartburn. Negative for blood in stool, diarrhea, melena and vomiting.  Genitourinary:  Negative for dysuria and urgency.  Musculoskeletal:  Negative for joint pain and myalgias.  Skin:  Negative for itching and rash.  Neurological:  Negative for dizziness and headaches.  Psychiatric/Behavioral:  Negative for depression. The patient is not nervous/anxious.        Objective: BP 132/74   Pulse (!) 105   Temp 98.4 F (36.9 C)   Ht 5\' 8"  (1.727 m)   Wt 258 lb (117 kg)   BMI 39.23 kg/m  Physical Exam Constitutional:      Appearance: Normal appearance.  HENT:     Head: Normocephalic and atraumatic.  Eyes:     Extraocular Movements: Extraocular movements intact.     Conjunctiva/sclera: Conjunctivae normal.  Cardiovascular:      Rate and Rhythm: Normal rate and regular rhythm.  Pulmonary:     Effort: Pulmonary effort is normal.     Breath sounds: Normal breath sounds.  Abdominal:     General: Bowel sounds are normal.     Palpations: Abdomen is soft.  Musculoskeletal:        General: No swelling. Normal range of motion.     Cervical back: Normal range of motion and neck supple.  Skin:    General: Skin is warm and dry.     Coloration: Skin is not jaundiced.  Neurological:  General: No focal deficit present.     Mental Status: She is alert and oriented to person, place, and time.  Psychiatric:        Mood and Affect: Mood normal.        Behavior: Behavior normal.      Assessment/Plan:  1.  Chronic GERD-well-controlled on omeprazole daily. Continue.  No alarm symptoms today to warrant further investigation with upper endoscopy.  2.  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  Linzess is unaffordable at this point.  Will give samples of Trulance and see how she does.  Call with update in 1 to 2 weeks and I will send in formal prescription if improved.  3.  Colon cancer screening-negative Cologuard last year, repeat every 3 years.  Patient average risk.   Follow-up in 3 months   02/13/2023 11:48 AM   Disclaimer: This note was dictated with voice recognition software. Similar sounding words can inadvertently be transcribed and may not be corrected upon review.

## 2023-02-18 ENCOUNTER — Other Ambulatory Visit: Payer: Self-pay | Admitting: *Deleted

## 2023-02-18 ENCOUNTER — Other Ambulatory Visit: Payer: Self-pay | Admitting: Hematology

## 2023-02-18 NOTE — Telephone Encounter (Signed)
Tamoxifen refill approved.  Patient is tolerating and is to continue therapy.

## 2023-02-23 ENCOUNTER — Other Ambulatory Visit: Payer: Self-pay | Admitting: Hematology

## 2023-02-27 ENCOUNTER — Encounter: Payer: Self-pay | Admitting: Internal Medicine

## 2023-02-27 ENCOUNTER — Other Ambulatory Visit: Payer: Self-pay | Admitting: Internal Medicine

## 2023-02-27 MED ORDER — TRULANCE 3 MG PO TABS: 3.0000 mg | ORAL_TABLET | Freq: Every day | ORAL | 5 refills | Status: DC

## 2023-03-06 ENCOUNTER — Other Ambulatory Visit: Payer: Self-pay

## 2023-03-06 DIAGNOSIS — D0512 Intraductal carcinoma in situ of left breast: Secondary | ICD-10-CM

## 2023-03-06 DIAGNOSIS — D649 Anemia, unspecified: Secondary | ICD-10-CM

## 2023-03-07 ENCOUNTER — Inpatient Hospital Stay: Payer: Medicare Other | Attending: Hematology

## 2023-03-07 DIAGNOSIS — N951 Menopausal and female climacteric states: Secondary | ICD-10-CM | POA: Insufficient documentation

## 2023-03-07 DIAGNOSIS — Z79811 Long term (current) use of aromatase inhibitors: Secondary | ICD-10-CM | POA: Insufficient documentation

## 2023-03-07 DIAGNOSIS — D649 Anemia, unspecified: Secondary | ICD-10-CM | POA: Diagnosis not present

## 2023-03-07 DIAGNOSIS — Z79899 Other long term (current) drug therapy: Secondary | ICD-10-CM | POA: Insufficient documentation

## 2023-03-07 DIAGNOSIS — K5900 Constipation, unspecified: Secondary | ICD-10-CM | POA: Diagnosis not present

## 2023-03-07 DIAGNOSIS — R61 Generalized hyperhidrosis: Secondary | ICD-10-CM | POA: Insufficient documentation

## 2023-03-07 DIAGNOSIS — R232 Flushing: Secondary | ICD-10-CM | POA: Diagnosis not present

## 2023-03-07 DIAGNOSIS — D0512 Intraductal carcinoma in situ of left breast: Secondary | ICD-10-CM | POA: Diagnosis not present

## 2023-03-07 DIAGNOSIS — Z79810 Long term (current) use of selective estrogen receptor modulators (SERMs): Secondary | ICD-10-CM | POA: Diagnosis not present

## 2023-03-07 DIAGNOSIS — R0602 Shortness of breath: Secondary | ICD-10-CM | POA: Diagnosis not present

## 2023-03-07 DIAGNOSIS — M8580 Other specified disorders of bone density and structure, unspecified site: Secondary | ICD-10-CM | POA: Diagnosis not present

## 2023-03-07 LAB — COMPREHENSIVE METABOLIC PANEL
ALT: 25 U/L (ref 0–44)
AST: 26 U/L (ref 15–41)
Albumin: 3.8 g/dL (ref 3.5–5.0)
Alkaline Phosphatase: 88 U/L (ref 38–126)
Anion gap: 10 (ref 5–15)
BUN: 15 mg/dL (ref 8–23)
CO2: 22 mmol/L (ref 22–32)
Calcium: 9.2 mg/dL (ref 8.9–10.3)
Chloride: 104 mmol/L (ref 98–111)
Creatinine, Ser: 0.9 mg/dL (ref 0.44–1.00)
GFR, Estimated: >60 mL/min (ref >60)
Glucose, Bld: 93 mg/dL (ref 70–99)
Potassium: 3.9 mmol/L (ref 3.5–5.1)
Sodium: 136 mmol/L (ref 135–145)
Total Bilirubin: 0.6 mg/dL (ref 0.3–1.2)
Total Protein: 7.5 g/dL (ref 6.5–8.1)

## 2023-03-07 LAB — CBC WITH DIFFERENTIAL/PLATELET
Abs Immature Granulocytes: 0.04 10*3/uL (ref 0.00–0.07)
Basophils Absolute: 0.1 10*3/uL (ref 0.0–0.1)
Basophils Relative: 1 %
Eosinophils Absolute: 0.2 10*3/uL (ref 0.0–0.5)
Eosinophils Relative: 2 %
HCT: 42.1 % (ref 36.0–46.0)
Hemoglobin: 13 g/dL (ref 12.0–15.0)
Immature Granulocytes: 0 %
Lymphocytes Relative: 24 %
Lymphs Abs: 2.3 10*3/uL (ref 0.7–4.0)
MCH: 27.8 pg (ref 26.0–34.0)
MCHC: 30.9 g/dL (ref 30.0–36.0)
MCV: 90 fL (ref 80.0–100.0)
Monocytes Absolute: 0.5 10*3/uL (ref 0.1–1.0)
Monocytes Relative: 6 %
Neutro Abs: 6.3 10*3/uL (ref 1.7–7.7)
Neutrophils Relative %: 67 %
Platelets: 312 10*3/uL (ref 150–400)
RBC: 4.68 MIL/uL (ref 3.87–5.11)
RDW: 13.5 % (ref 11.5–15.5)
WBC: 9.5 10*3/uL (ref 4.0–10.5)
nRBC: 0 % (ref 0.0–0.2)

## 2023-03-07 LAB — FERRITIN: Ferritin: 131 ng/mL (ref 11–307)

## 2023-03-07 LAB — IRON AND TIBC
Iron: 58 ug/dL (ref 28–170)
Saturation Ratios: 16 % (ref 10.4–31.8)
TIBC: 361 ug/dL (ref 250–450)
UIBC: 303 ug/dL

## 2023-03-07 LAB — VITAMIN D 25 HYDROXY (VIT D DEFICIENCY, FRACTURES): Vit D, 25-Hydroxy: 32.37 ng/mL (ref 30–100)

## 2023-03-13 ENCOUNTER — Inpatient Hospital Stay: Payer: Medicare Other | Admitting: Oncology

## 2023-03-13 VITALS — BP 120/56 | HR 80 | Temp 97.7°F | Resp 17 | Ht 68.0 in | Wt 261.6 lb

## 2023-03-13 DIAGNOSIS — R61 Generalized hyperhidrosis: Secondary | ICD-10-CM | POA: Diagnosis not present

## 2023-03-13 DIAGNOSIS — R0602 Shortness of breath: Secondary | ICD-10-CM | POA: Diagnosis not present

## 2023-03-13 DIAGNOSIS — Z79899 Other long term (current) drug therapy: Secondary | ICD-10-CM | POA: Diagnosis not present

## 2023-03-13 DIAGNOSIS — D0512 Intraductal carcinoma in situ of left breast: Secondary | ICD-10-CM | POA: Diagnosis not present

## 2023-03-13 DIAGNOSIS — K5900 Constipation, unspecified: Secondary | ICD-10-CM | POA: Diagnosis not present

## 2023-03-13 DIAGNOSIS — R232 Flushing: Secondary | ICD-10-CM | POA: Diagnosis not present

## 2023-03-13 DIAGNOSIS — Z79811 Long term (current) use of aromatase inhibitors: Secondary | ICD-10-CM | POA: Diagnosis not present

## 2023-03-13 DIAGNOSIS — D649 Anemia, unspecified: Secondary | ICD-10-CM | POA: Diagnosis not present

## 2023-03-13 DIAGNOSIS — M8580 Other specified disorders of bone density and structure, unspecified site: Secondary | ICD-10-CM | POA: Diagnosis not present

## 2023-03-13 NOTE — Progress Notes (Signed)
[facility] [address], [place] [address]   Patient Care Team: Rakes, Doralee Albino, FNP as PCP - General (Family Medicine) Doreatha Massed, MD as Medical Oncologist (Medical Oncology)  SUMMARY OF ONCOLOGIC HISTORY: Oncology History  Ductal carcinoma in situ (DCIS) of left breast  07/06/2021 Initial Diagnosis   Ductal carcinoma in situ (DCIS) of left breast   07/17/2021 Cancer Staging   Staging form: Breast, AJCC 8th Edition - Clinical stage from 07/17/2021: Stage 0 (cTis (DCIS), cN0, cM0, G3, ER+, PR+, HER2: Not Assessed) - Signed by Doreatha Massed, MD on 07/17/2021 Stage prefix: Initial diagnosis Nuclear grade: G3 Histologic grading system: 3 grade system     CHIEF COMPLIANT: Follow-up of left breast DCIS   INTERVAL HISTORY: Amber Diaz is a 67 y.o. female seen for follow-up of left breast DCIS.  She was last seen in clinic on 09/05/2022.  Denies any surgeries, hospitalizations or changes in baseline health.  Had a bone density scan completed on 01/03/2023 which showed a T-score of -1.1 with improvement from previous although she remains osteopenic.  She is currently taking a multivitamin with both calcium and vitamin D.  Reports she continues to have joint pain, hot flashes and night sweats secondary to tamoxifen.  Reports symptoms side effects are stable and not worsening.  Has chronic hip/back and knee pain.  Has shortness of breath with exertion and intermittent constipation.  Appetite is not 100% energy levels are 25%.  Reports she takes her tamoxifen at bedtime.  She denies nausea, vomiting or abdominal pain. She denies easy bruising or signs of active bleeding.    REVIEW OF SYSTEMS:   Review of Systems  Constitutional:  Positive for diaphoresis (Night sweats) and malaise/fatigue.  Respiratory:  Positive for shortness of breath (With exertion).   Gastrointestinal:  Positive for constipation.  Genitourinary:  Positive for frequency and  urgency.  Musculoskeletal:  Positive for joint pain.  Psychiatric/Behavioral:  The patient has insomnia.     I have reviewed the past medical history, past surgical history, social history and family history with the patient and they are unchanged from previous note.   ALLERGIES:   has No Known Allergies.   MEDICATIONS:  Current Outpatient Medications  Medication Sig Dispense Refill   furosemide (LASIX) 20 MG tablet Take 1 tablet (20 mg total) by mouth daily. 30 tablet 3   olmesartan (BENICAR) 40 MG tablet TAKE 1 TABLET BY MOUTH DAILY 100 tablet 2   omeprazole (PRILOSEC) 40 MG capsule Take 1 capsule (40 mg total) by mouth daily. 100 capsule 1   Plecanatide (TRULANCE) 3 MG TABS Take 1 tablet (3 mg total) by mouth daily. 30 tablet 5   Red Yeast Rice 600 MG CAPS Take by mouth.     solifenacin (VESICARE) 5 MG tablet Take 1 tablet (5 mg total) by mouth daily. 90 tablet 0   tamoxifen (NOLVADEX) 20 MG tablet TAKE 1 TABLET BY MOUTH DAILY 100 tablet 2   No current facility-administered medications for this visit.     PHYSICAL EXAMINATION: Performance status (ECOG): 1 - Symptomatic but completely ambulatory  Vitals:   03/13/23 0924  BP: (!) 120/56  Pulse: 80  Resp: 17  Temp: 97.7 F (36.5 C)  SpO2: 98%   Wt Readings from Last 3 Encounters:  03/13/23 261 lb 9.6 oz (118.7 kg)  02/13/23 258 lb (117 kg)  02/07/23 257 lb 9.6 oz (116.8 kg)   Physical Exam Constitutional:      Appearance:  Normal appearance.  Cardiovascular:     Rate and Rhythm: Normal rate and regular rhythm.  Pulmonary:     Effort: Pulmonary effort is normal.     Breath sounds: Normal breath sounds.  Chest:     Comments: Deferred  Abdominal:     General: Bowel sounds are normal.     Palpations: Abdomen is soft.  Musculoskeletal:        General: No swelling. Normal range of motion.  Neurological:     Mental Status: She is alert and oriented to person, place, and time. Mental status is at baseline.      LABORATORY DATA:  I have reviewed the data as listed    Latest Ref Rng & Units 03/07/2023   12:51 PM 02/04/2023    9:05 AM 09/05/2022   12:14 PM  CMP  Glucose 70 - 99 mg/dL 93  161  92   BUN 8 - 23 mg/dL 15  13  16    Creatinine 0.44 - 1.00 mg/dL 0.96  0.45  4.09   Sodium 135 - 145 mmol/L 136  140  138   Potassium 3.5 - 5.1 mmol/L 3.9  4.2  3.7   Chloride 98 - 111 mmol/L 104  105  105   CO2 22 - 32 mmol/L 22  23  26    Calcium 8.9 - 10.3 mg/dL 9.2  9.5  9.0   Total Protein 6.5 - 8.1 g/dL 7.5  6.6  7.3   Total Bilirubin 0.3 - 1.2 mg/dL 0.6  0.4  0.7   Alkaline Phos 38 - 126 U/L 88  102  98   AST 15 - 41 U/L 26  20  15    ALT 0 - 44 U/L 25  20  13     No results found for: "CAN153" Lab Results  Component Value Date   WBC 9.5 03/07/2023   HGB 13.0 03/07/2023   HCT 42.1 03/07/2023   MCV 90.0 03/07/2023   PLT 312 03/07/2023   NEUTROABS 6.3 03/07/2023    ASSESSMENT:  Left breast high-grade DCIS, ER positive: - Screening mammogram on 08/12/2020 with asymmetry in the outer left breast in [place], [place]. - Diagnostic mammogram/ultrasound and US biopsy on 09/05/2020, pathology consistent with high-grade DCIS, ER/PR positive. - Lumpectomy and SLN biopsy on 10/03/2020 - Pathology with 1.4 cm DCIS, margins negative, 4/4 lymph nodes negative. - Status post XRT to the left breast - She was reportedly negative for genetic testing.  She had previous right breast biopsies x4 (1996, 2006, 2021). - She was started on anastrozole, developed nausea, vomiting and abdominal pain - Exemestane started on 01/10/2021, discontinued in the first week of February 2023 secondary to hypertension. - Letrozole started on 07/17/2021. - She has some hot flashes which started after taking anastrozole but never went away.  They are tolerable. - She also has joint pains in the ankles, knees, hips, shoulders and wrists which are chronic and stable.    Social/family history: - She is retired Engineer, manufacturing.  Recently moved from [place] to [place]. - She smoked 2 packs/day from age 80 through 7 and quit. - Mother had breast cancer at age 15 and 61.  Maternal grandmother had breast cancer at age 62.  Father had cancer on cervical spine.  Primary unknown.   PLAN:  Left breast DCIS, high-grade, ER/PR positive: - She continues tamoxifen 20 mg daily.  Continues to have arthralgias, hot flashes and insomnia but is agreeable to continue treatment. -We did  discuss switching the time of day she is taking her tamoxifen from at bedtime to first thing in the morning to see if this changes side effects.  We discussed that if no improvement, she may take her tamoxifen at a time she chooses. -Most recent mammogram was from 4-24 which showed stable lumpectomy changes in left breast.  No evidence of malignancy.  Repeat in 1 year. -Will repeat mammogram in 6 months.  Orders placed. -Discussed returning to clinic in 6 months after mammogram with labs a few days before for assessment and to review results.   2.  Osteopenia (DEXA July 2022 T score -1.4): - She is currently taking a multivitamin with both calcium and vitamin D. -Most recent bone density was from 12/30/2022 which showed a T-score of -1.1. -Vitamin D level from 03/07/2023 was 32.37. -She was not taking calcium and vitamin D supplements due to hypercalcemia.  Calcium has now normalized and is 9.2.  3.  Mild hypercalcemia: - Previous workup showed normal vitamin D and 25-hydroxy vitamin D, SPEP, immunofixation, free light chains.  PTH RP was normal.  Intact PTH was 46. - Ultrasound neck (03/21/2022): No parathyroid adenoma.  2.6 cm exophytic ill-defined nodule in the inferior pole of the lower lobe of thyroid. - Thyroid biopsy was Bethesda category 2 with benign follicular nodules. - Nuclear medicine parathyroid scan (05/02/2022): Subtle areas of tracer retention at the inferior pole of the left lobe as well as upper poles of both lobes  without definite dominant mass/adenoma identified.  Pattern is nonspecific and could potentially represent parathyroid hyperplasia. - Underwent parathyroidectomy on 07/30/2022 and calcium levels are back to normal  4. Normocytic anemia: -Found to have mild anemia on 08/27/2022 lab work showing hemoglobin of 11.4.  -Repeat labs on 03/07/2023 show hemoglobin of 13.0 with ferritin 131 and iron saturation 16%. -No indication for iron replacement at this time.  PLAN SUMMARY: >> Mammogram in 6 months.  Orders placed. >> Repeat labs in 6 months after mammogram and office visit a few days later. >> Will need breast exam at that time.     Orders placed this encounter:  Orders Placed This Encounter  Procedures   MM 3D SCREENING MAMMOGRAM BILATERAL BREAST    The patient has a good understanding of the overall plan. She agrees with it. She will call with any problems that may develop before the next visit here.  I spent 25 minutes dedicated to the care of this patient (face-to-face and non-face-to-face) on the date of the encounter to include what is described in the assessment and plan.   Durenda Hurt, NP 03/13/2023 9:59 AM

## 2023-04-10 ENCOUNTER — Other Ambulatory Visit: Payer: Self-pay | Admitting: Family Medicine

## 2023-04-10 DIAGNOSIS — N3941 Urge incontinence: Secondary | ICD-10-CM

## 2023-04-17 ENCOUNTER — Other Ambulatory Visit: Payer: Self-pay | Admitting: Family Medicine

## 2023-04-17 DIAGNOSIS — I10 Essential (primary) hypertension: Secondary | ICD-10-CM

## 2023-04-30 ENCOUNTER — Encounter: Payer: Self-pay | Admitting: Family Medicine

## 2023-05-13 ENCOUNTER — Ambulatory Visit: Payer: Medicare Other | Admitting: Family Medicine

## 2023-05-15 ENCOUNTER — Ambulatory Visit: Payer: Medicare Other | Admitting: Gastroenterology

## 2023-06-05 ENCOUNTER — Ambulatory Visit (INDEPENDENT_AMBULATORY_CARE_PROVIDER_SITE_OTHER): Payer: Medicare Other | Admitting: Family Medicine

## 2023-06-05 ENCOUNTER — Encounter: Payer: Self-pay | Admitting: Family Medicine

## 2023-06-05 VITALS — BP 117/62 | HR 82 | Temp 97.1°F | Ht 68.0 in | Wt 263.8 lb

## 2023-06-05 DIAGNOSIS — I10 Essential (primary) hypertension: Secondary | ICD-10-CM | POA: Diagnosis not present

## 2023-06-05 DIAGNOSIS — R2232 Localized swelling, mass and lump, left upper limb: Secondary | ICD-10-CM | POA: Diagnosis not present

## 2023-06-05 DIAGNOSIS — R221 Localized swelling, mass and lump, neck: Secondary | ICD-10-CM | POA: Diagnosis not present

## 2023-06-05 DIAGNOSIS — Z9089 Acquired absence of other organs: Secondary | ICD-10-CM

## 2023-06-05 DIAGNOSIS — E559 Vitamin D deficiency, unspecified: Secondary | ICD-10-CM

## 2023-06-05 DIAGNOSIS — Z9889 Other specified postprocedural states: Secondary | ICD-10-CM | POA: Diagnosis not present

## 2023-06-05 DIAGNOSIS — K581 Irritable bowel syndrome with constipation: Secondary | ICD-10-CM | POA: Diagnosis not present

## 2023-06-05 DIAGNOSIS — E782 Mixed hyperlipidemia: Secondary | ICD-10-CM | POA: Diagnosis not present

## 2023-06-05 DIAGNOSIS — N3941 Urge incontinence: Secondary | ICD-10-CM

## 2023-06-05 MED ORDER — FUROSEMIDE 20 MG PO TABS: 20.0000 mg | ORAL_TABLET | Freq: Every day | ORAL | 1 refills | Status: DC

## 2023-06-05 NOTE — Progress Notes (Signed)
 Subjective:  Patient ID: Amber Diaz, female    DOB: April 08, 1956, 68 y.o.   MRN: [id number]  Patient Care Team: Severa Rock HERO, FNP as PCP - General (Family Medicine) Rogers Hai, MD as Medical Oncologist (Medical Oncology)   Chief Complaint:  Medical Management of Chronic Issues (Chronic check up )   HPI: Amber Diaz is a 68 y.o. female presenting on 06/05/2023 for Medical Management of Chronic Issues (Chronic check up )   Discussed the use of AI scribe software for clinical note transcription with the patient, who gave verbal consent to proceed.  History of Present Illness   The patient, with a history of hypertension, breast cancer, and constipation, presents with concerns about occasional dizziness, which they associate with taking Lasix . The dizziness typically occurs a couple of hours post-medication and lasts for about an hour. They also report a low blood pressure reading of 84, which occurred in the evening.  The patient has recently stopped taking VESIcare , tamoxifen , and other medications due to constipation and other side effects, including hot flashes and pain. They have not taken any laxatives since December 1st and have noticed an improvement in their constipation. They continue to take Lasix , olmesartan , and omeprazole .  They also report a persistent lump in the axilla where a lymph node was previously removed, and two lumps in the neck, which have been present for approximately four years. The patient denies any recent weight loss, significant fatigue, night sweats, blood in stool or urine, or significant leg swelling.  The patient's mother had breast cancer and took tamoxifen , but the patient is currently not on tamoxifen  due to the side effects. They are considering genetic testing for breast cancer risk. They are also considering cancer marker testing, pending discussion with their oncologist.  The patient's vision is changing, but this is attributed to aging.  They have no complaints of chest pain, worsening leg pain, or shortness of breath.          Relevant past medical, surgical, family, and social history reviewed and updated as indicated.  Allergies and medications reviewed and updated. Data reviewed: Chart in Epic.   Past Medical History:  Diagnosis Date   Arthritis    Breast cancer (HCC)    Dyspnea    With exertion at times   GERD (gastroesophageal reflux disease)    Headache    HTN (hypertension)    Psoriasis     Past Surgical History:  Procedure Laterality Date   BREAST LUMPECTOMY Left    BREAST SURGERY     CESAREAN SECTION     EYE SURGERY     PARATHYROIDECTOMY N/A 07/30/2022   Procedure: NECK EXPLORATION WITH PARATHYROIDECTOMY;  Surgeon: Eletha Boas, MD;  Location: [facility];  Service: General;  Laterality: N/A;   TUBAL LIGATION      Social History   Socioeconomic History   Marital status: Married    Spouse name: Not on file   Number of children: 1   Years of education: Not on file   Highest education level: 12th grade  Occupational History   Not on file  Tobacco Use   Smoking status: Former    Current packs/day: 2.00    Average packs/day: 2.0 packs/day for 17.0 years (34.0 ttl pk-yrs)    Types: Cigarettes   Smokeless tobacco: Never  Vaping Use   Vaping status: Never Used  Substance and Sexual Activity   Alcohol use: Not Currently   Drug use: Never   Sexual activity: Not  Currently    Birth control/protection: Post-menopausal  Other Topics Concern   Not on file  Social History Narrative   Not on file   Social Drivers of Health   Financial Resource Strain: Low Risk  (06/01/2023)   Overall Financial Resource Strain (CARDIA)    Difficulty of Paying Living Expenses: Not very hard  Food Insecurity: No Food Insecurity (06/01/2023)   Hunger Vital Sign    Worried About Running Out of Food in the Last Year: Never true    Ran Out of Food in the Last Year: Never true  Transportation Needs: No Transportation Needs  (06/01/2023)   PRAPARE - Administrator, Civil Service (Medical): No    Lack of Transportation (Non-Medical): No  Physical Activity: Inactive (06/01/2023)   Exercise Vital Sign    Days of Exercise per Week: 0 days    Minutes of Exercise per Session: 0 min  Stress: No Stress Concern Present (06/01/2023)   [employer] of Occupational Health - Occupational Stress Questionnaire    Feeling of Stress : Only a little  Social Connections: Moderately Isolated (06/01/2023)   Social Connection and Isolation Panel [NHANES]    Frequency of Communication with Friends and Family: More than three times a week    Frequency of Social Gatherings with Friends and Family: Once a week    Attends Religious Services: Never    Database Administrator or Organizations: No    Attends Banker Meetings: Never    Marital Status: Married  Catering Manager Violence: Not At Risk (07/30/2022)   Humiliation, Afraid, Rape, and Kick questionnaire    Fear of Current or Ex-Partner: No    Emotionally Abused: No    Physically Abused: No    Sexually Abused: No    Outpatient Encounter Medications as of 06/05/2023  Medication Sig   olmesartan  (BENICAR ) 40 MG tablet TAKE 1 TABLET BY MOUTH DAILY   omeprazole  (PRILOSEC) 40 MG capsule Take 1 capsule (40 mg total) by mouth daily.   [DISCONTINUED] Red Yeast Rice 600 MG CAPS Take by mouth.   [DISCONTINUED] solifenacin  (VESICARE ) 5 MG tablet TAKE 1 TABLET BY MOUTH DAILY   [DISCONTINUED] tamoxifen  (NOLVADEX ) 20 MG tablet TAKE 1 TABLET BY MOUTH DAILY   furosemide  (LASIX ) 20 MG tablet Take 1 tablet (20 mg total) by mouth daily.   [DISCONTINUED] furosemide  (LASIX ) 20 MG tablet TAKE 1 TABLET BY MOUTH DAILY   [DISCONTINUED] Plecanatide  (TRULANCE ) 3 MG TABS Take 1 tablet (3 mg total) by mouth daily.   No facility-administered encounter medications on file as of 06/05/2023.    No Known Allergies  Pertinent ROS per HPI, otherwise unremarkable      Objective:  BP  117/62   Pulse 82   Temp (!) 97.1 F (36.2 C)   Ht 5' 8 (1.727 m)   Wt 263 lb 12.8 oz (119.7 kg)   SpO2 96%   BMI 40.11 kg/m    Wt Readings from Last 3 Encounters:  06/05/23 263 lb 12.8 oz (119.7 kg)  03/13/23 261 lb 9.6 oz (118.7 kg)  02/13/23 258 lb (117 kg)    Physical Exam Vitals and nursing note reviewed.  Constitutional:      General: She is not in acute distress.    Appearance: Normal appearance. She is well-developed and well-groomed. She is morbidly obese. She is not ill-appearing, toxic-appearing or diaphoretic.  HENT:     Head: Normocephalic and atraumatic.     Jaw: There is normal jaw occlusion.  Right Ear: Hearing normal.     Left Ear: Hearing normal.     Nose: Nose normal.     Mouth/Throat:     Lips: Pink.     Mouth: Mucous membranes are moist.     Pharynx: Uvula midline.  Eyes:     General: Lids are normal.     Conjunctiva/sclera: Conjunctivae normal.     Pupils: Pupils are equal, round, and reactive to light.  Neck:     Thyroid : No thyroid  mass, thyromegaly or thyroid  tenderness.     Vascular: No carotid bruit or JVD.     Trachea: Trachea and phonation normal.   Cardiovascular:     Rate and Rhythm: Normal rate and regular rhythm.     Chest Wall: PMI is not displaced.     Pulses: Normal pulses.     Heart sounds: Normal heart sounds. No murmur heard.    No friction rub. No gallop.  Pulmonary:     Effort: Pulmonary effort is normal. No respiratory distress.     Breath sounds: Normal breath sounds. No wheezing.  Abdominal:     Palpations: Abdomen is soft.  Musculoskeletal:        General: Normal range of motion.     Cervical back: Normal range of motion and neck supple.     Right lower leg: No edema.     Left lower leg: No edema.  Lymphadenopathy:     Cervical: Cervical adenopathy present.     Upper Body:     Left upper body: Axillary adenopathy present.  Skin:    General: Skin is warm and dry.     Capillary Refill: Capillary refill takes  less than 2 seconds.     Coloration: Skin is not cyanotic, jaundiced or pale.     Findings: No rash.  Neurological:     General: No focal deficit present.     Mental Status: She is alert and oriented to person, place, and time.     Sensory: Sensation is intact.     Motor: Motor function is intact.     Coordination: Coordination is intact.     Gait: Gait is intact.     Deep Tendon Reflexes: Reflexes are normal and symmetric.  Psychiatric:        Attention and Perception: Attention and perception normal.        Mood and Affect: Mood and affect normal.        Speech: Speech normal.        Behavior: Behavior normal. Behavior is cooperative.        Thought Content: Thought content normal.        Cognition and Memory: Cognition and memory normal.        Judgment: Judgment normal.     NECK: Palpable lymph nodes present bilaterally   Results for orders placed or performed in visit on 03/07/23  Vitamin D  25 hydroxy   Collection Time: 03/07/23 12:51 PM  Result Value Ref Range   Vit D, 25-Hydroxy 32.37 30 - 100 ng/mL  Iron and TIBC ([facility] ONLY)   Collection Time: 03/07/23 12:51 PM  Result Value Ref Range   Iron 58 28 - 170 ug/dL   TIBC 638 749 - 549 ug/dL   Saturation Ratios 16 10.4 - 31.8 %   UIBC 303 ug/dL  Ferritin   Collection Time: 03/07/23 12:51 PM  Result Value Ref Range   Ferritin 131 11 - 307 ng/mL  Comprehensive metabolic panel   Collection Time:  03/07/23 12:51 PM  Result Value Ref Range   Sodium 136 135 - 145 mmol/L   Potassium 3.9 3.5 - 5.1 mmol/L   Chloride 104 98 - 111 mmol/L   CO2 22 22 - 32 mmol/L   Glucose, Bld 93 70 - 99 mg/dL   BUN 15 8 - 23 mg/dL   Creatinine, Ser 9.09 0.44 - 1.00 mg/dL   Calcium  9.2 8.9 - 10.3 mg/dL   Total Protein 7.5 6.5 - 8.1 g/dL   Albumin 3.8 3.5 - 5.0 g/dL   AST 26 15 - 41 U/L   ALT 25 0 - 44 U/L   Alkaline Phosphatase 88 38 - 126 U/L   Total Bilirubin 0.6 0.3 - 1.2 mg/dL   GFR, Estimated >39 >39 mL/min    Anion gap 10 5 - 15  CBC with Differential   Collection Time: 03/07/23 12:51 PM  Result Value Ref Range   WBC 9.5 4.0 - 10.5 K/uL   RBC 4.68 3.87 - 5.11 MIL/uL   Hemoglobin 13.0 12.0 - 15.0 g/dL   HCT 57.8 63.9 - 53.9 %   MCV 90.0 80.0 - 100.0 fL   MCH 27.8 26.0 - 34.0 pg   MCHC 30.9 30.0 - 36.0 g/dL   RDW 86.4 88.4 - 84.4 %   Platelets 312 150 - 400 K/uL   nRBC 0.0 0.0 - 0.2 %   Neutrophils Relative % 67 %   Neutro Abs 6.3 1.7 - 7.7 K/uL   Lymphocytes Relative 24 %   Lymphs Abs 2.3 0.7 - 4.0 K/uL   Monocytes Relative 6 %   Monocytes Absolute 0.5 0.1 - 1.0 K/uL   Eosinophils Relative 2 %   Eosinophils Absolute 0.2 0.0 - 0.5 K/uL   Basophils Relative 1 %   Basophils Absolute 0.1 0.0 - 0.1 K/uL   Immature Granulocytes 0 %   Abs Immature Granulocytes 0.04 0.00 - 0.07 K/uL       Pertinent labs & imaging results that were available during my care of the patient were reviewed by me and considered in my medical decision making.  Assessment & Plan:  Amber Diaz was seen today for medical management of chronic issues.  Diagnoses and all orders for this visit:  Primary hypertension -     furosemide  (LASIX ) 20 MG tablet; Take 1 tablet (20 mg total) by mouth daily. -     CMP14+EGFR -     CBC with Differential/Platelet -     Thyroid  Panel With TSH  Mixed hyperlipidemia -     CMP14+EGFR -     Lipid panel  Neck mass -     US  Soft Tissue Head/Neck (NON-THYROID ); Future  Mass of left axilla -     US  AXILLA LEFT; Future  Vitamin D  deficiency -     CMP14+EGFR -     VITAMIN D  25 Hydroxy (Vit-D Deficiency, Fractures)  Irritable bowel syndrome with constipation Stopped medications, feels this has helped her constipation   Hypercalcemia -     CMP14+EGFR -     Parathyroid  hormone, intact (no Ca)  Status post parathyroidectomy -     Thyroid  Panel With TSH -     Parathyroid  hormone, intact (no Ca)     Assessment and Plan    Hypertension Hypertension managed with Lasix  and  azilsartan. Reports dizziness a few hours after taking Lasix , lasting about an hour. Blood pressure readings generally good, but one reading was 84/49, occurring later in the evening. Discussed adjusting medication timing to  mitigate dizziness and low readings. - Take Lasix  in the morning - Take azilsartan in the evening - Monitor blood pressure regularly  Lymphadenopathy Persistent lumps in the axilla and neck for approximately four years. No significant symptoms such as weight loss, fatigue, night sweats, or hematuria. Possible differential includes calcified lymph nodes or other benign conditions. Discussed imaging to evaluate masses. - Order ultrasound of axillary and neck masses  Breast Cancer Stopped tamoxifen  due to intolerable side effects including hot flashes, constipation, and pain. Discussed risk-benefit analysis of tamoxifen  use, noting a 6% better chance of preventing recurrence with tamoxifen . Emphasized the importance of regular screenings and communication with oncologist. - Message Dr. MARLA about stopping tamoxifen  - Stay on top of regular screenings - Discuss cancer markers and genetic testing with oncologist  General Health Maintenance Generally well with no significant new symptoms. Regular follow-up and lab work necessary to monitor overall health. - Order lab work including parathyroid  hormone, thyroid  function, and cholesterol levels - Schedule follow-up in six months unless new issues arise  Follow-up - Message Dr. MARLA about stopping tamoxifen  - Discuss cancer markers and genetic testing with oncologist - Follow up in six months for routine check-up.         Continue all other maintenance medications.  Follow up plan: Return in 6 months (on 12/03/2023), or if symptoms worsen or fail to improve, for schedule AWV, Annual Physical.   Continue healthy lifestyle choices, including diet (rich in fruits, vegetables, and lean proteins, and low in salt and simple  carbohydrates) and exercise (at least 30 minutes of moderate physical activity daily).   The above assessment and management plan was discussed with the patient. The patient verbalized understanding of and has agreed to the management plan. Patient is aware to call the clinic if they develop any new symptoms or if symptoms persist or worsen. Patient is aware when to return to the clinic for a follow-up visit. Patient educated on when it is appropriate to go to the emergency department.   Amber Bruns, FNP-C [facility] [phone]

## 2023-06-06 ENCOUNTER — Other Ambulatory Visit: Payer: Self-pay | Admitting: Medical Genetics

## 2023-06-06 ENCOUNTER — Encounter: Payer: Self-pay | Admitting: Family Medicine

## 2023-06-06 LAB — CMP14+EGFR
ALT: 19 [IU]/L (ref 0–32)
AST: 20 [IU]/L (ref 0–40)
Albumin: 4.1 g/dL (ref 3.9–4.9)
Alkaline Phosphatase: 100 [IU]/L (ref 44–121)
BUN/Creatinine Ratio: 17 (ref 12–28)
BUN: 19 mg/dL (ref 8–27)
Bilirubin Total: 0.5 mg/dL (ref 0.0–1.2)
CO2: 24 mmol/L (ref 20–29)
Calcium: 9.9 mg/dL (ref 8.7–10.3)
Chloride: 102 mmol/L (ref 96–106)
Creatinine, Ser: 1.14 mg/dL — ABNORMAL HIGH (ref 0.57–1.00)
Globulin, Total: 2.9 g/dL (ref 1.5–4.5)
Glucose: 102 mg/dL — ABNORMAL HIGH (ref 70–99)
Potassium: 4.4 mmol/L (ref 3.5–5.2)
Sodium: 139 mmol/L (ref 134–144)
Total Protein: 7 g/dL (ref 6.0–8.5)
eGFR: 53 mL/min/{1.73_m2} — ABNORMAL LOW (ref >59)

## 2023-06-06 LAB — CBC WITH DIFFERENTIAL/PLATELET
Basophils Absolute: 0.1 10*3/uL (ref 0.0–0.2)
Basos: 1 %
EOS (ABSOLUTE): 0.2 10*3/uL (ref 0.0–0.4)
Eos: 3 %
Hematocrit: 39.4 % (ref 34.0–46.6)
Hemoglobin: 12.7 g/dL (ref 11.1–15.9)
Immature Grans (Abs): 0 10*3/uL (ref 0.0–0.1)
Immature Granulocytes: 0 %
Lymphocytes Absolute: 1.9 10*3/uL (ref 0.7–3.1)
Lymphs: 25 %
MCH: 27.7 pg (ref 26.6–33.0)
MCHC: 32.2 g/dL (ref 31.5–35.7)
MCV: 86 fL (ref 79–97)
Monocytes Absolute: 0.5 10*3/uL (ref 0.1–0.9)
Monocytes: 7 %
Neutrophils Absolute: 4.9 10*3/uL (ref 1.4–7.0)
Neutrophils: 64 %
Platelets: 290 10*3/uL (ref 150–450)
RBC: 4.58 x10E6/uL (ref 3.77–5.28)
RDW: 13.8 % (ref 11.7–15.4)
WBC: 7.6 10*3/uL (ref 3.4–10.8)

## 2023-06-06 LAB — LIPID PANEL
Chol/HDL Ratio: 6.9 {ratio} — ABNORMAL HIGH (ref 0.0–4.4)
Cholesterol, Total: 250 mg/dL — ABNORMAL HIGH (ref 100–199)
HDL: 36 mg/dL — ABNORMAL LOW (ref >39)
LDL Chol Calc (NIH): 170 mg/dL — ABNORMAL HIGH (ref 0–99)
Triglycerides: 233 mg/dL — ABNORMAL HIGH (ref 0–149)
VLDL Cholesterol Cal: 44 mg/dL — ABNORMAL HIGH (ref 5–40)

## 2023-06-06 LAB — THYROID PANEL WITH TSH
Free Thyroxine Index: 1.5 (ref 1.2–4.9)
T3 Uptake Ratio: 26 % (ref 24–39)
T4, Total: 5.8 ug/dL (ref 4.5–12.0)
TSH: 1.32 u[IU]/mL (ref 0.450–4.500)

## 2023-06-06 LAB — VITAMIN D 25 HYDROXY (VIT D DEFICIENCY, FRACTURES): Vit D, 25-Hydroxy: 18.1 ng/mL — ABNORMAL LOW (ref 30.0–100.0)

## 2023-06-06 LAB — PARATHYROID HORMONE, INTACT (NO CA): PTH: 88 pg/mL — ABNORMAL HIGH (ref 15–65)

## 2023-06-10 ENCOUNTER — Ambulatory Visit (HOSPITAL_COMMUNITY)
Admission: RE | Admit: 2023-06-10 | Discharge: 2023-06-10 | Disposition: A | Discharge status: Home / Self Care | Payer: Medicare Other | Source: Ambulatory Visit | Attending: Family Medicine | Admitting: Family Medicine

## 2023-06-10 ENCOUNTER — Other Ambulatory Visit (HOSPITAL_COMMUNITY)
Admission: RE | Admit: 2023-06-10 | Discharge: 2023-06-10 | Discharge status: Home / Self Care | Payer: Medicare Other | Source: Ambulatory Visit | Attending: Medical Genetics | Admitting: Medical Genetics

## 2023-06-10 DIAGNOSIS — R221 Localized swelling, mass and lump, neck: Secondary | ICD-10-CM | POA: Insufficient documentation

## 2023-06-10 DIAGNOSIS — R590 Localized enlarged lymph nodes: Secondary | ICD-10-CM | POA: Diagnosis not present

## 2023-06-11 ENCOUNTER — Other Ambulatory Visit: Payer: Self-pay | Admitting: Family Medicine

## 2023-06-11 DIAGNOSIS — R2232 Localized swelling, mass and lump, left upper limb: Secondary | ICD-10-CM

## 2023-06-20 LAB — GENECONNECT MOLECULAR SCREEN: Genetic Analysis Overall Interpretation: NEGATIVE

## 2023-07-17 ENCOUNTER — Ambulatory Visit: Payer: Medicare Other

## 2023-07-17 VITALS — Ht 68.0 in | Wt 263.0 lb

## 2023-07-17 DIAGNOSIS — Z0000 Encounter for general adult medical examination without abnormal findings: Secondary | ICD-10-CM | POA: Diagnosis not present

## 2023-07-17 NOTE — Progress Notes (Signed)
 Subjective:   Amber Diaz is a 68 y.o. who presents for a Medicare Wellness preventive visit.  Visit Complete: Virtual I connected with  Amber Diaz on 07/17/23 by a audio enabled telemedicine application and verified that I am speaking with the correct person using two identifiers.  Patient Location: Home  Provider Location: Home Office  I discussed the limitations of evaluation and management by telemedicine. The patient expressed understanding and agreed to proceed.  Vital Signs: Because this visit was a virtual/telehealth visit, some criteria may be missing or patient reported. Any vitals not documented were not able to be obtained and vitals that have been documented are patient reported.  VideoDeclined- This patient declined Librarian, academic. Therefore the visit was completed with audio only.  AWV Questionnaire: Yes: Patient Medicare AWV questionnaire was completed by the patient on 07/17/23; I have confirmed that all information answered by patient is correct and no changes since this date.  Cardiac Risk Factors include: advanced age (>59men, >57 women);hypertension;smoking/ tobacco exposure     Objective:    Today's Vitals   07/17/23 1530  Weight: 263 lb (119.3 kg)  Height: 5\' 8"  (1.727 m)   Body mass index is 39.99 kg/m.     07/17/2023    3:33 PM 03/13/2023    9:23 AM 09/05/2022   11:45 AM 07/30/2022    7:31 AM 07/29/2022    1:54 PM 07/10/2022    2:15 PM 05/09/2022   11:00 AM  Advanced Directives  Does Patient Have a Medical Advance Directive? Yes Yes No Yes Yes Yes No  Type of Estate agent of [place];Living will Healthcare Power of [place];Living will  Healthcare Power of [employer] Power of [employer] Power of [place];Living will   Does patient want to make changes to medical advance directive? No - Patient declined No - Patient declined  No - Patient declined     Copy of Healthcare Power of  Attorney in Chart? Yes - validated most recent copy scanned in chart (See row information)   Yes - validated most recent copy scanned in chart (See row information) Yes - validated most recent copy scanned in chart (See row information) No - copy requested   Would patient like information on creating a medical advance directive?  No - Patient declined No - Patient declined    No - Patient declined    Current Medications (verified) Outpatient Encounter Medications as of 07/17/2023  Medication Sig   furosemide (LASIX) 20 MG tablet Take 1 tablet (20 mg total) by mouth daily.   olmesartan (BENICAR) 40 MG tablet TAKE 1 TABLET BY MOUTH DAILY   omeprazole (PRILOSEC) 40 MG capsule Take 1 capsule (40 mg total) by mouth daily.   No facility-administered encounter medications on file as of 07/17/2023.    Allergies (verified) Patient has no known allergies.   History: Past Medical History:  Diagnosis Date   Arthritis    Breast cancer (HCC)    Dyspnea    With exertion at times   GERD (gastroesophageal reflux disease)    Headache    HTN (hypertension)    Psoriasis    Past Surgical History:  Procedure Laterality Date   BREAST LUMPECTOMY Left    BREAST SURGERY     CESAREAN SECTION     EYE SURGERY     PARATHYROIDECTOMY N/A 07/30/2022   Procedure: NECK EXPLORATION WITH PARATHYROIDECTOMY;  Surgeon: Darnell Level, MD;  Location: [facility];  Service: General;  Laterality: N/A;  TUBAL LIGATION     Family History  Problem Relation Age of Onset   Breast cancer Mother    Heart attack Mother    Bone cancer Father    Obesity Son    Breast cancer Maternal Grandmother    Social History   Socioeconomic History   Marital status: Married    Spouse name: Not on file   Number of children: 1   Years of education: Not on file   Highest education level: 12th grade  Occupational History   Not on file  Tobacco Use   Smoking status: Former    Current packs/day: 2.00    Average packs/day: 2.0 packs/day  for 17.0 years (34.0 ttl pk-yrs)    Types: Cigarettes   Smokeless tobacco: Never  Vaping Use   Vaping status: Never Used  Substance and Sexual Activity   Alcohol use: Not Currently   Drug use: Never   Sexual activity: Not Currently    Birth control/protection: Post-menopausal  Other Topics Concern   Not on file  Social History Narrative   Not on file   Social Drivers of Health   Financial Resource Strain: Low Risk  (07/17/2023)   Overall Financial Resource Strain (CARDIA)    Difficulty of Paying Living Expenses: Not hard at all  Food Insecurity: No Food Insecurity (07/17/2023)   Hunger Vital Sign    Worried About Running Out of Food in the Last Year: Never true    Ran Out of Food in the Last Year: Never true  Transportation Needs: No Transportation Needs (07/17/2023)   PRAPARE - Administrator, Civil Service (Medical): No    Lack of Transportation (Non-Medical): No  Physical Activity: Inactive (07/17/2023)   Exercise Vital Sign    Days of Exercise per Week: 0 days    Minutes of Exercise per Session: 0 min  Stress: No Stress Concern Present (07/17/2023)   [employer] of Occupational Health - Occupational Stress Questionnaire    Feeling of Stress : Only a little  Social Connections: Moderately Integrated (07/17/2023)   Social Connection and Isolation Panel [NHANES]    Frequency of Communication with Friends and Family: More than three times a week    Frequency of Social Gatherings with Friends and Family: Once a week    Attends Religious Services: Never    Database administrator or Organizations: No    Attends Engineer, structural: More than 4 times per year    Marital Status: Married  Recent Concern: Social Connections - Moderately Isolated (06/01/2023)   Social Connection and Isolation Panel [NHANES]    Frequency of Communication with Friends and Family: More than three times a week    Frequency of Social Gatherings with Friends and Family: Once a week     Attends Religious Services: Never    Database administrator or Organizations: No    Attends Engineer, structural: Not on file    Marital Status: Married    Tobacco Counseling Counseling given: Not Answered    Clinical Intake:  Pre-visit preparation completed: Yes  Pain : No/denies pain     Diabetes: No  How often do you need to have someone help you when you read instructions, pamphlets, or other written materials from your doctor or pharmacy?: 1 - Never  Interpreter Needed?: No  Information entered by :: Kandis Fantasia LPN   Activities of Daily Living     07/17/2023    9:28 AM 07/30/2022  1:51 PM  In your present state of health, do you have any difficulty performing the following activities:  Hearing? 0 1  Vision? 0 1  Difficulty concentrating or making decisions? 0 0  Walking or climbing stairs? 0 1  Dressing or bathing? 0 0  Doing errands, shopping? 0 0  Preparing Food and eating ? N   Using the Toilet? N   In the past six months, have you accidently leaked urine? Y   Do you have problems with loss of bowel control? Y   Managing your Medications? N   Managing your Finances? N   Housekeeping or managing your Housekeeping? N     Patient Care Team: Sonny Masters, FNP as PCP - General (Family Medicine) Doreatha Massed, MD as Medical Oncologist (Medical Oncology)  Indicate any recent Medical Services you may have received from other than [facility] providers in the past year (date may be approximate).     Assessment:   This is a routine wellness examination for [place].  Hearing/Vision screen Hearing Screening - Comments:: Denies hearing difficulties   Vision Screening - Comments:: up to date with routine eye exams with Dr. Conley Rolls     Goals Addressed   None    Depression Screen     07/17/2023    3:32 PM 06/05/2023   10:59 AM 02/07/2023   10:48 AM 11/07/2022   11:09 AM 07/10/2022    2:14 PM 05/08/2022   11:19 AM 11/06/2021   10:57 AM  PHQ  2/9 Scores  PHQ - 2 Score 0 0 0 0 0 0 0  PHQ- 9 Score 2 2 0 1  6 5     Fall Risk     07/17/2023    3:33 PM 07/17/2023    9:28 AM 06/05/2023   10:59 AM 02/07/2023   10:47 AM 11/07/2022   11:09 AM  Fall Risk   Falls in the past year? 0 0 0 0 0  Number falls in past yr: 0  0    Injury with Fall? 0      Risk for fall due to : No Fall Risks  No Fall Risks    Follow up Falls prevention discussed;Education provided;Falls evaluation completed  Falls evaluation completed      MEDICARE RISK AT HOME:  Medicare Risk at Home Any stairs in or around the home?: (Patient-Rptd) Yes If so, are there any without handrails?: (Patient-Rptd) No Home free of loose throw rugs in walkways, pet beds, electrical cords, etc?: (Patient-Rptd) Yes Adequate lighting in your home to reduce risk of falls?: (Patient-Rptd) Yes Life alert?: (Patient-Rptd) No Use of a cane, walker or w/c?: (Patient-Rptd) No Grab bars in the bathroom?: (Patient-Rptd) Yes Shower chair or bench in shower?: (Patient-Rptd) Yes Elevated toilet seat or a handicapped toilet?: (Patient-Rptd) No  TIMED UP AND GO:  Was the test performed?  No  Cognitive Function: 6CIT completed        07/17/2023    3:33 PM 07/10/2022    2:16 PM 07/09/2021    2:01 PM  6CIT Screen  What Year? 0 points 0 points 0 points  What month? 0 points 0 points 0 points  What time? 0 points 0 points 0 points  Count back from 20 0 points 0 points 0 points  Months in reverse 0 points 0 points 0 points  Repeat phrase 0 points 0 points 0 points  Total Score 0 points 0 points 0 points    Immunizations Immunization History  Administered Date(s) Administered  PFIZER(Purple Top)SARS-COV-2 Vaccination 01/14/2020    Screening Tests Health Maintenance  Topic Date Due   COVID-19 Vaccine (2 - Pfizer risk series) 02/04/2020   INFLUENZA VACCINE  08/25/2023 (Originally 12/26/2022)   Zoster Vaccines- Shingrix (1 of 2) 09/03/2023 (Originally 03/04/1975)   Lung Cancer  Screening  02/07/2024 (Originally 03/03/2006)   Pneumonia Vaccine 24+ Years old (1 of 1 - PCV) 02/07/2024 (Originally 03/03/2021)   Hepatitis C Screening  02/07/2024 (Originally 03[birth year]03/1974)   DTaP/Tdap/Td (1 - Tdap) 06/04/2024 (Originally [birth year]04/1975)   MAMMOGRAM  08/27/2023   Medicare Annual Wellness (AWV)  07/16/2024   Fecal DNA (Cologuard)  09/10/2024   DEXA SCAN  12/29/2024   HPV VACCINES  Aged Out    Health Maintenance  Health Maintenance Due  Topic Date Due   COVID-19 Vaccine (2 - Pfizer risk series) 02/04/2020   Health Maintenance Items Addressed: Patient declines all vaccines  Additional Screening:  Vision Screening: Recommended annual ophthalmology exams for early detection of glaucoma and other disorders of the eye.  Dental Screening: Recommended annual dental exams for proper oral hygiene  Community Resource Referral / Chronic Care Management: CRR required this visit?  No   CCM required this visit?  No     Plan:     I have personally reviewed and noted the following in the patient's chart:   Medical and social history Use of alcohol, tobacco or illicit drugs  Current medications and supplements including opioid prescriptions. Patient is not currently taking opioid prescriptions. Functional ability and status Nutritional status Physical activity Advanced directives List of other physicians Hospitalizations, surgeries, and ER visits in previous 12 months Vitals Screenings to include cognitive, depression, and falls Referrals and appointments  In addition, I have reviewed and discussed with patient certain preventive protocols, quality metrics, and best practice recommendations. A written personalized care plan for preventive services as well as general preventive health recommendations were provided to patient.     Kandis Fantasia [place], [place]   06/26/8655   After Visit Summary: (MyChart) Due to this being a telephonic visit, the after visit summary with  patients personalized plan was offered to patient via MyChart   Notes: Nothing significant to report at this time.

## 2023-07-17 NOTE — Patient Instructions (Signed)
 Amber Diaz , Thank you for taking time to come for your Medicare Wellness Visit. I appreciate your ongoing commitment to your health goals. Please review the following plan we discussed and let me know if I can assist you in the future.   Referrals/Orders/Follow-Ups/Clinician Recommendations: Aim for 30 minutes of exercise or brisk walking, 6-8 glasses of water, and 5 servings of fruits and vegetables each day.  This is a list of the screening recommended for you and due dates:  Health Maintenance  Topic Date Due   COVID-19 Vaccine (2 - Pfizer risk series) 02/04/2020   Flu Shot  08/25/2023*   Zoster (Shingles) Vaccine (1 of 2) 09/03/2023*   Screening for Lung Cancer  02/07/2024*   Pneumonia Vaccine (1 of 1 - PCV) 02/07/2024*   Hepatitis C Screening  02/07/2024*   DTaP/Tdap/Td vaccine (1 - Tdap) 06/04/2024*   Mammogram  08/27/2023   Medicare Annual Wellness Visit  07/16/2024   Cologuard (Stool DNA test)  09/10/2024   DEXA scan (bone density measurement)  12/29/2024   HPV Vaccine  Aged Out  *Topic was postponed. The date shown is not the original due date.    Advanced directives: (In Chart) A copy of your advanced directives are scanned into your chart should your provider ever need it.  Next Medicare Annual Wellness Visit scheduled for next year: Yes

## 2023-07-22 ENCOUNTER — Ambulatory Visit (HOSPITAL_COMMUNITY)
Admission: RE | Admit: 2023-07-22 | Discharge: 2023-07-22 | Disposition: A | Discharge status: Home / Self Care | Payer: Medicare Other | Source: Ambulatory Visit | Attending: Family Medicine | Admitting: Family Medicine

## 2023-07-22 ENCOUNTER — Encounter (HOSPITAL_COMMUNITY): Payer: Self-pay

## 2023-07-22 DIAGNOSIS — Z853 Personal history of malignant neoplasm of breast: Secondary | ICD-10-CM | POA: Diagnosis not present

## 2023-07-22 DIAGNOSIS — N6320 Unspecified lump in the left breast, unspecified quadrant: Secondary | ICD-10-CM | POA: Diagnosis not present

## 2023-07-22 DIAGNOSIS — R2232 Localized swelling, mass and lump, left upper limb: Secondary | ICD-10-CM | POA: Diagnosis not present

## 2023-07-22 DIAGNOSIS — R92323 Mammographic fibroglandular density, bilateral breasts: Secondary | ICD-10-CM | POA: Diagnosis not present

## 2023-07-22 DIAGNOSIS — M79622 Pain in left upper arm: Secondary | ICD-10-CM | POA: Diagnosis not present

## 2023-07-29 IMAGING — DX DG CERVICAL SPINE COMPLETE 4+V
5 series · 5 of 5 positions shown · non-contrast
Comparison: No prior.

CLINICAL DATA: History of neck pain.

EXAM:
CERVICAL SPINE - COMPLETE 4+ VIEW

[c-spine lat]
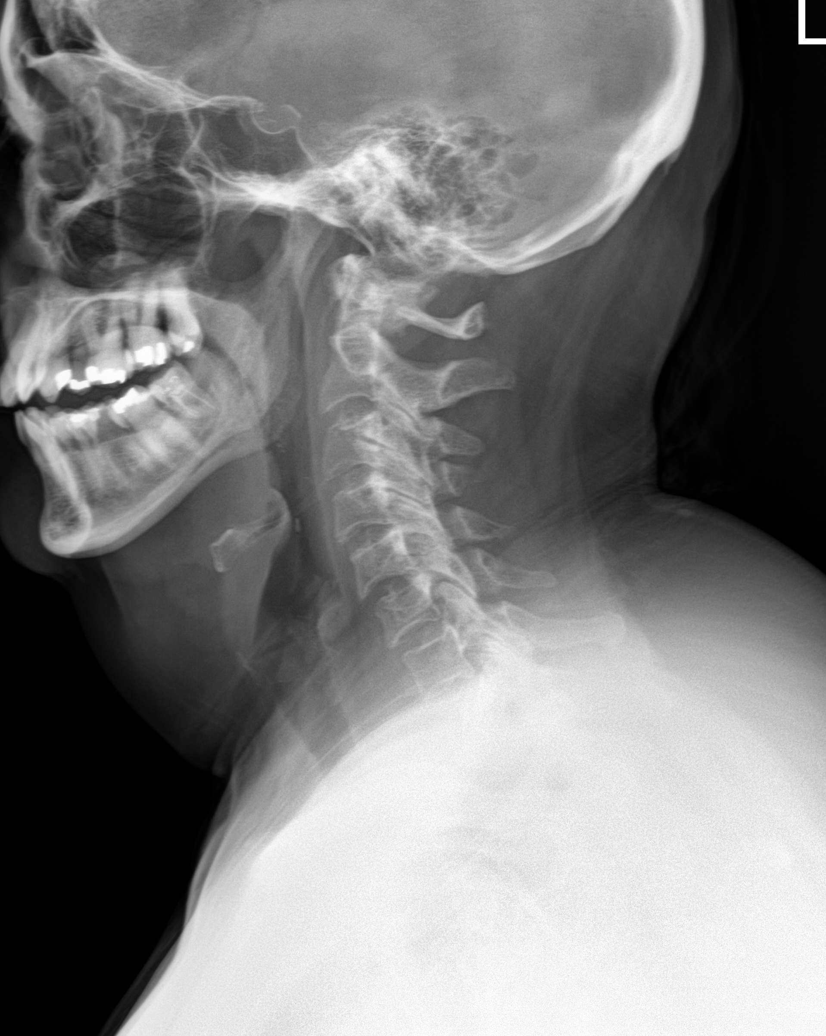

[c-spine obl (1 of 2)]
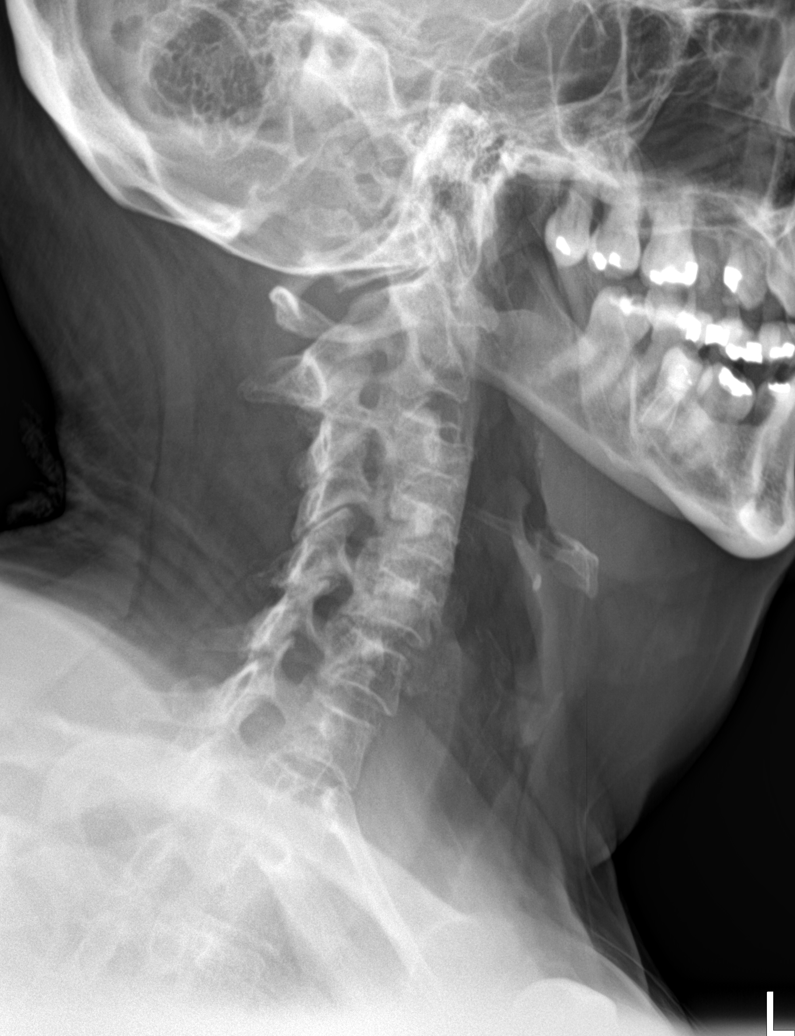

[c-spine obl (2 of 2)]
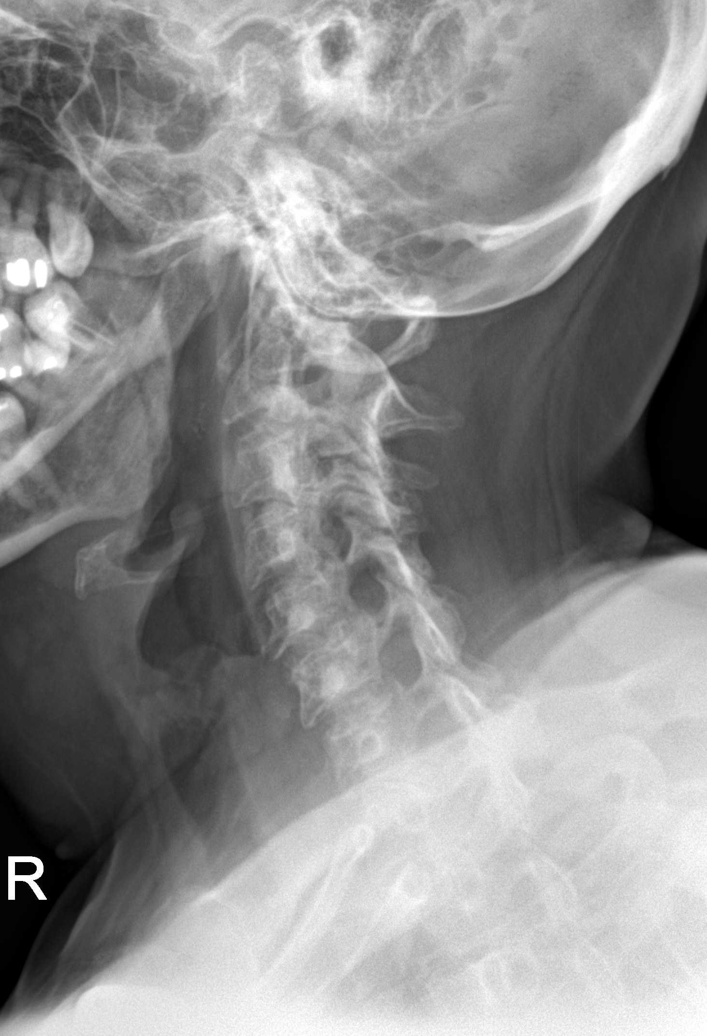

[c-spine ap]
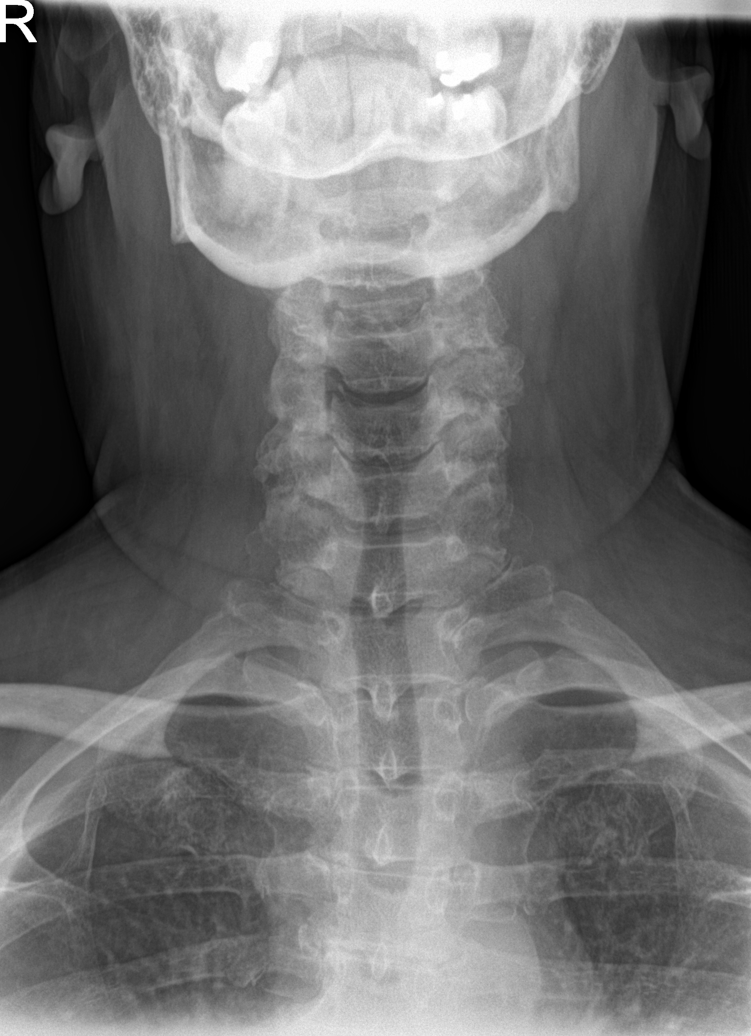

[c-spine open mouth]
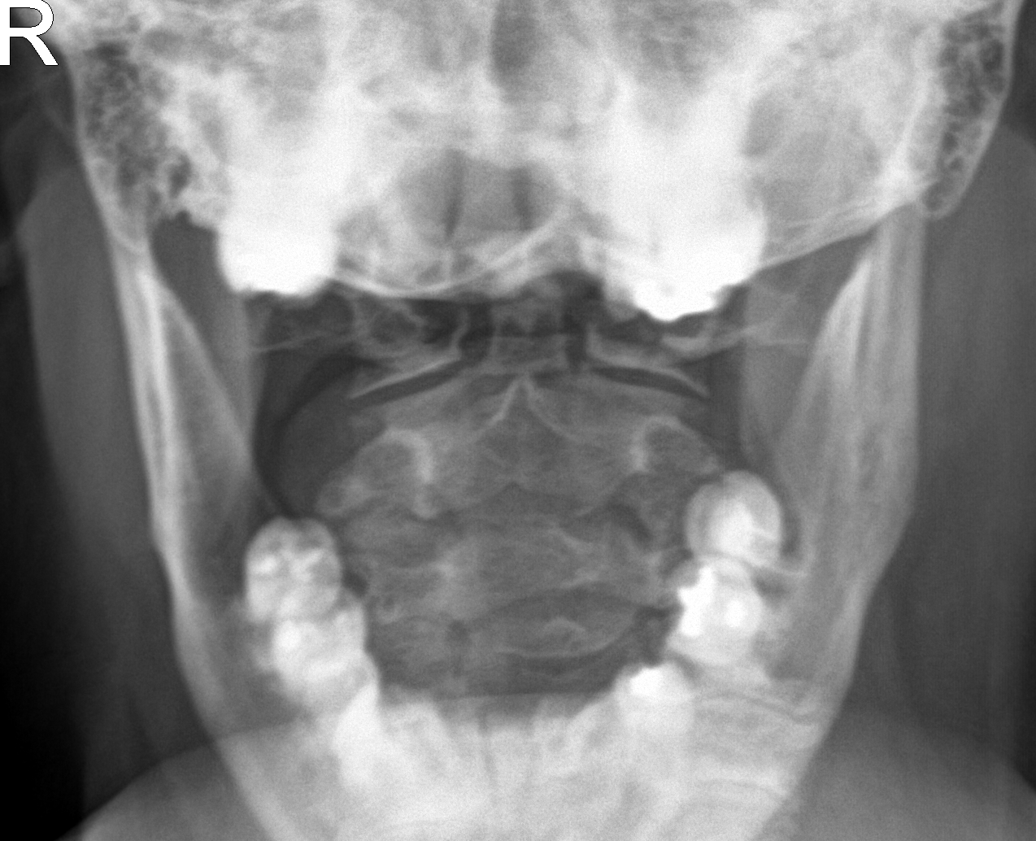

[5 of 5 positions shown; findings below may reference images not displayed]

FINDINGS: Diffuse multilevel disc degeneration, endplate osteophyte formation,
and facet hypertrophy. 2 mm anterolisthesis C5 on C6. This is most
likely degenerative. Multilevel bilateral neural foraminal narrowing
noted. No evidence of fracture or dislocation. No acute abnormality
identified. Biapical pleural thickening most consistent scarring.
IMPRESSION: Diffuse multilevel degenerative change. 2 mm anterolisthesis C5 on
C6. No acute bony abnormality identified.

## 2023-08-10 ENCOUNTER — Other Ambulatory Visit: Payer: Self-pay | Admitting: Family Medicine

## 2023-08-10 DIAGNOSIS — K219 Gastro-esophageal reflux disease without esophagitis: Secondary | ICD-10-CM

## 2023-08-11 NOTE — Telephone Encounter (Signed)
 Not on pt med list-ok to refill?

## 2023-08-28 ENCOUNTER — Other Ambulatory Visit: Payer: Self-pay

## 2023-08-28 DIAGNOSIS — D0512 Intraductal carcinoma in situ of left breast: Secondary | ICD-10-CM

## 2023-08-28 DIAGNOSIS — D649 Anemia, unspecified: Secondary | ICD-10-CM

## 2023-08-29 ENCOUNTER — Inpatient Hospital Stay: Payer: Medicare Other | Attending: Hematology

## 2023-08-29 ENCOUNTER — Ambulatory Visit (HOSPITAL_COMMUNITY): Payer: Medicare Other

## 2023-08-29 DIAGNOSIS — Z79899 Other long term (current) drug therapy: Secondary | ICD-10-CM | POA: Diagnosis not present

## 2023-08-29 DIAGNOSIS — R0602 Shortness of breath: Secondary | ICD-10-CM | POA: Insufficient documentation

## 2023-08-29 DIAGNOSIS — K5900 Constipation, unspecified: Secondary | ICD-10-CM | POA: Insufficient documentation

## 2023-08-29 DIAGNOSIS — Z923 Personal history of irradiation: Secondary | ICD-10-CM | POA: Insufficient documentation

## 2023-08-29 DIAGNOSIS — Z79811 Long term (current) use of aromatase inhibitors: Secondary | ICD-10-CM | POA: Diagnosis not present

## 2023-08-29 DIAGNOSIS — D649 Anemia, unspecified: Secondary | ICD-10-CM

## 2023-08-29 DIAGNOSIS — D0512 Intraductal carcinoma in situ of left breast: Secondary | ICD-10-CM | POA: Insufficient documentation

## 2023-08-29 LAB — CBC WITH DIFFERENTIAL/PLATELET
Abs Immature Granulocytes: 0.03 10*3/uL (ref 0.00–0.07)
Basophils Absolute: 0.1 10*3/uL (ref 0.0–0.1)
Basophils Relative: 1 %
Eosinophils Absolute: 0.2 10*3/uL (ref 0.0–0.5)
Eosinophils Relative: 2 %
HCT: 39.8 % (ref 36.0–46.0)
Hemoglobin: 12.5 g/dL (ref 12.0–15.0)
Immature Granulocytes: 0 %
Lymphocytes Relative: 30 %
Lymphs Abs: 2.5 10*3/uL (ref 0.7–4.0)
MCH: 28 pg (ref 26.0–34.0)
MCHC: 31.4 g/dL (ref 30.0–36.0)
MCV: 89 fL (ref 80.0–100.0)
Monocytes Absolute: 0.6 10*3/uL (ref 0.1–1.0)
Monocytes Relative: 7 %
Neutro Abs: 5.1 10*3/uL (ref 1.7–7.7)
Neutrophils Relative %: 60 %
Platelets: 288 10*3/uL (ref 150–400)
RBC: 4.47 MIL/uL (ref 3.87–5.11)
RDW: 13.4 % (ref 11.5–15.5)
WBC: 8.4 10*3/uL (ref 4.0–10.5)
nRBC: 0 % (ref 0.0–0.2)

## 2023-08-29 LAB — COMPREHENSIVE METABOLIC PANEL WITH GFR
ALT: 17 U/L (ref 0–44)
AST: 16 U/L (ref 15–41)
Albumin: 3.5 g/dL (ref 3.5–5.0)
Alkaline Phosphatase: 69 U/L (ref 38–126)
Anion gap: 10 (ref 5–15)
BUN: 21 mg/dL (ref 8–23)
CO2: 22 mmol/L (ref 22–32)
Calcium: 9.7 mg/dL (ref 8.9–10.3)
Chloride: 106 mmol/L (ref 98–111)
Creatinine, Ser: 0.95 mg/dL (ref 0.44–1.00)
GFR, Estimated: >60 mL/min (ref >60)
Glucose, Bld: 99 mg/dL (ref 70–99)
Potassium: 4 mmol/L (ref 3.5–5.1)
Sodium: 138 mmol/L (ref 135–145)
Total Bilirubin: 0.7 mg/dL (ref 0.0–1.2)
Total Protein: 7.4 g/dL (ref 6.5–8.1)

## 2023-08-29 LAB — IRON AND TIBC
Iron: 80 ug/dL (ref 28–170)
Saturation Ratios: 22 % (ref 10.4–31.8)
TIBC: 367 ug/dL (ref 250–450)
UIBC: 287 ug/dL

## 2023-08-29 LAB — FERRITIN: Ferritin: 132 ng/mL (ref 11–307)

## 2023-09-05 ENCOUNTER — Inpatient Hospital Stay: Payer: Medicare Other | Admitting: Oncology

## 2023-09-05 DIAGNOSIS — D0512 Intraductal carcinoma in situ of left breast: Secondary | ICD-10-CM

## 2023-09-05 DIAGNOSIS — M8588 Other specified disorders of bone density and structure, other site: Secondary | ICD-10-CM

## 2023-09-05 DIAGNOSIS — Z170 Estrogen receptor positive status [ER+]: Secondary | ICD-10-CM

## 2023-09-05 DIAGNOSIS — D649 Anemia, unspecified: Secondary | ICD-10-CM | POA: Diagnosis not present

## 2023-09-05 NOTE — Progress Notes (Signed)
 [facility] [address], [place] [address]   Patient Care Team: Rakes, Doralee Albino, FNP as PCP - General (Family Medicine) Doreatha Massed, MD as Medical Oncologist (Medical Oncology)  SUMMARY OF ONCOLOGIC HISTORY: Oncology History  Ductal carcinoma in situ (DCIS) of left breast  07/06/2021 Initial Diagnosis   Ductal carcinoma in situ (DCIS) of left breast   07/17/2021 Cancer Staging   Staging form: Breast, AJCC 8th Edition - Clinical stage from 07/17/2021: Stage 0 (cTis (DCIS), cN0, cM0, G3, ER+, PR+, HER2: Not Assessed) - Signed by Doreatha Massed, MD on 07/17/2021 Stage prefix: Initial diagnosis Nuclear grade: G3 Histologic grading system: 3 grade system     CHIEF COMPLIANT: Follow-up of left breast DCIS  I connected with Amber Diaz on 09/05/23 at 11:30 AM EDT by telephone visit and verified that I am speaking with the correct person using two identifiers.   I discussed the limitations, risks, security and privacy concerns of performing an evaluation and management service by telemedicine and the availability of in-person appointments. I also discussed with the patient that there may be a patient responsible charge related to this service. The patient expressed understanding and agreed to proceed.   Other persons participating in the visit and their role in the encounter: None  Patient's location: Home  Provider's location: Clinic    INTERVAL HISTORY: Amber Diaz is a 68 y.o. female seen for follow-up of left breast DCIS.  She was last seen in clinic on 03/13/2023.  She denies any surgeries, hospitalizations or changes in her baseline health.  Reports she stopped taking tamoxifen in February due to side effects including severe hot flashes and joint pain all over.  Reports her side effects have not significantly improved since she stopped tamoxifen.  She took for a total of 3 years although she has been on several different antihormone  pills.  Most recent mammogram is from 07/22/2023 after she presented to her PCP for edema and puffiness to her left axilla.  Mammogram and ultrasound were both negative for abnormality or malignancy.  Recommended follow-up screening mammogram in 1 year.  Had a bone density scan completed on 01/03/2023 which showed a T-score of -1.1 with improvement from previous although she remains osteopenic.  She is currently not taking any over-the-counter supplements.   Has shortness of breath with exertion and intermittent constipation.  Appetite is not 100% energy levels are 25%.   She denies nausea, vomiting or abdominal pain. She denies easy bruising or signs of active bleeding.    REVIEW OF SYSTEMS:   Review of Systems  Constitutional:  Positive for diaphoresis (Night sweats) and malaise/fatigue.  Respiratory:  Positive for shortness of breath (With exertion).   Gastrointestinal:  Positive for constipation.  Musculoskeletal:  Positive for joint pain.  Psychiatric/Behavioral:  The patient is nervous/anxious and has insomnia.     I have reviewed the past medical history, past surgical history, social history and family history with the patient and they are unchanged from previous note.   ALLERGIES:   has no known allergies.   MEDICATIONS:  Current Outpatient Medications  Medication Sig Dispense Refill   furosemide (LASIX) 20 MG tablet Take 1 tablet (20 mg total) by mouth daily. 100 tablet 1   nystatin cream (MYCOSTATIN) APPLY TOPICALLY TO AFFECTED  AREA(S) TWICE DAILY 30 g 0   olmesartan (BENICAR) 40 MG tablet TAKE 1 TABLET BY MOUTH DAILY 100 tablet 2   omeprazole (PRILOSEC) 40 MG capsule TAKE 1 CAPSULE  BY MOUTH DAILY 100 capsule 2   No current facility-administered medications for this visit.     PHYSICAL EXAMINATION: Performance status (ECOG): 1 - Symptomatic but completely ambulatory  There were no vitals filed for this visit.  Wt Readings from Last 3 Encounters:  07/17/23 263 lb  (119.3 kg)  06/05/23 263 lb 12.8 oz (119.7 kg)  03/13/23 261 lb 9.6 oz (118.7 kg)   Physical Exam Neurological:     Mental Status: She is alert and oriented to person, place, and time.     LABORATORY DATA:  I have reviewed the data as listed    Latest Ref Rng & Units 08/29/2023   11:44 AM 06/05/2023   11:31 AM 03/07/2023   12:51 PM  CMP  Glucose 70 - 99 mg/dL 99  409  93   BUN 8 - 23 mg/dL 21  19  15    Creatinine 0.44 - 1.00 mg/dL 8.11  9.14  7.82   Sodium 135 - 145 mmol/L 138  139  136   Potassium 3.5 - 5.1 mmol/L 4.0  4.4  3.9   Chloride 98 - 111 mmol/L 106  102  104   CO2 22 - 32 mmol/L 22  24  22    Calcium 8.9 - 10.3 mg/dL 9.7  9.9  9.2   Total Protein 6.5 - 8.1 g/dL 7.4  7.0  7.5   Total Bilirubin 0.0 - 1.2 mg/dL 0.7  0.5  0.6   Alkaline Phos 38 - 126 U/L 69  100  88   AST 15 - 41 U/L 16  20  26    ALT 0 - 44 U/L 17  19  25     No results found for: "CAN153" Lab Results  Component Value Date   WBC 8.4 08/29/2023   HGB 12.5 08/29/2023   HCT 39.8 08/29/2023   MCV 89.0 08/29/2023   PLT 288 08/29/2023   NEUTROABS 5.1 08/29/2023    ASSESSMENT:  Left breast high-grade DCIS, ER positive: - Screening mammogram on 08/12/2020 with asymmetry in the outer left breast in [place], [place]. - Diagnostic mammogram/ultrasound and US biopsy on 09/05/2020, pathology consistent with high-grade DCIS, ER/PR positive. - Lumpectomy and SLN biopsy on 10/03/2020 - Pathology with 1.4 cm DCIS, margins negative, 4/4 lymph nodes negative. - Status post XRT to the left breast - She was reportedly negative for genetic testing.  She had previous right breast biopsies x4 (1996, 2006, 2021). - She was started on anastrozole, developed nausea, vomiting and abdominal pain - Exemestane started on 01/10/2021, discontinued in the first week of February 2023 secondary to hypertension. - Letrozole started on 07/17/2021. - She has some hot flashes which started after taking anastrozole but never went  away.  They are tolerable. - She also has joint pains in the ankles, knees, hips, shoulders and wrists which are chronic and stable.    Social/family history: - She is retired Print production planner.  Recently moved from [place] to [place]. - She smoked 2 packs/day from age 55 through 79 and quit. - Mother had breast cancer at age 88 and 68.  Maternal grandmother had breast cancer at age 10.  Father had cancer on cervical spine.  Primary unknown.   PLAN:  Left breast DCIS, high-grade, ER/PR positive: - Patient discontinued tamoxifen in February 2025 due to side effects.  She is not interested in restarting. -We discussed BCI testing and she is interested although her original diagnosis was not with [facility].  Will reach  out to [facility] to see if we are able to get tissue.  - Most recent mammo was from 07/22/2023 which showed no abnormality at site of fullness/tenderness in left axilla and no evidence of malignancy in either breast.  Recommend screening mammogram in 1 year. -Lab work shows normal CBC and CMP. -Return to clinic in 6 months with labs and see provider in clinic.   2.  Osteopenia (DEXA July 2022 T score -1.4): - She is currently not taking any vitamins. -Most recent bone density was from 12/30/2022 which showed a T-score of -1.1. -Vitamin D level from 03/07/2023 was 32.37.  3.  Mild hypercalcemia: - Previous workup showed normal vitamin D and 25-hydroxy vitamin D, SPEP, immunofixation, free light chains.  PTH RP was normal.  Intact PTH was 46. - Ultrasound neck (03/21/2022): No parathyroid adenoma.  2.6 cm exophytic ill-defined nodule in the inferior pole of the lower lobe of thyroid. - Thyroid biopsy was Bethesda category 2 with benign follicular nodules. - Nuclear medicine parathyroid scan (05/02/2022): Subtle areas of tracer retention at the inferior pole of the left lobe as well as upper poles of both lobes without definite dominant mass/adenoma identified.  Pattern is  nonspecific and could potentially represent parathyroid hyperplasia. - Underwent parathyroidectomy on 07/30/2022 and calcium levels are back to normal  4. Normocytic anemia: -Found to have mild anemia on 08/27/2022 lab work showing hemoglobin of 11.4.  -Repeat lab work in November and now in April shows stability of her hemoglobin along with her iron levels.  No indication for iron replacement at this time.  PLAN SUMMARY: >> Screening mammogram in February 2026. >> Repeat labs in 6 months and telephone visit. >> Will discuss with Lequita Halt BCI testing if it is feasible given diagnosis was not here at [facility].     Orders placed this encounter:  Orders Placed This Encounter  Procedures   Iron and TIBC ([facility] ONLY)   Ferritin   Comprehensive metabolic panel   CBC with Differential    The patient has a good understanding of the overall plan. She agrees with it. She will call with any problems that may develop before the next visit here.  I provided 13 minutes of non face-to-face telephone visit time during this encounter, and > 50% was spent counseling as documented under my assessment & plan.    Durenda Hurt, NP 09/05/2023 11:40 AM

## 2023-11-26 ENCOUNTER — Other Ambulatory Visit: Payer: Self-pay | Admitting: Hematology

## 2023-11-26 ENCOUNTER — Encounter: Payer: Self-pay | Admitting: Family Medicine

## 2023-11-26 ENCOUNTER — Other Ambulatory Visit: Payer: Self-pay | Admitting: Family Medicine

## 2023-11-26 ENCOUNTER — Ambulatory Visit: Payer: Medicare Other | Admitting: Family Medicine

## 2023-11-26 VITALS — BP 128/70 | HR 59 | Temp 97.3°F | Ht 68.0 in | Wt 261.2 lb

## 2023-11-26 DIAGNOSIS — I10 Essential (primary) hypertension: Secondary | ICD-10-CM

## 2023-11-26 DIAGNOSIS — J418 Mixed simple and mucopurulent chronic bronchitis: Secondary | ICD-10-CM

## 2023-11-26 DIAGNOSIS — L409 Psoriasis, unspecified: Secondary | ICD-10-CM | POA: Diagnosis not present

## 2023-11-26 DIAGNOSIS — E782 Mixed hyperlipidemia: Secondary | ICD-10-CM

## 2023-11-26 DIAGNOSIS — Z87891 Personal history of nicotine dependence: Secondary | ICD-10-CM | POA: Diagnosis not present

## 2023-11-26 DIAGNOSIS — Z0000 Encounter for general adult medical examination without abnormal findings: Secondary | ICD-10-CM | POA: Diagnosis not present

## 2023-11-26 DIAGNOSIS — E559 Vitamin D deficiency, unspecified: Secondary | ICD-10-CM

## 2023-11-26 DIAGNOSIS — Z0001 Encounter for general adult medical examination with abnormal findings: Secondary | ICD-10-CM

## 2023-11-26 MED ORDER — BETAMETHASONE DIPROPIONATE 0.05 % EX OINT: TOPICAL_OINTMENT | Freq: Every day | CUTANEOUS | 6 refills | Status: DC

## 2023-11-26 NOTE — Progress Notes (Signed)
 Complete physical exam  Patient: Amber Diaz   DOB: 1956-02-13   68 y.o. Female  MRN: [id number]  Subjective:    Chief Complaint  Patient presents with   Annual Exam    Amber Diaz is a 68 y.o. female who presents today for a complete physical exam. She reports consuming a general diet. The patient does not participate in regular exercise at present. She generally feels well. She reports sleeping well. She does not have additional problems to discuss today.   Amber Diaz is a 68 year old female who presents for an annual physical exam.  Breast cancer and tamoxifen -related symptoms - History of breast cancer, previously on tamoxifen , restarted on June 10th - Currently taking tamoxifen  20 mg daily - Since restarting tamoxifen , experiences intermittent generalized aches and pains and night sweats throughout the day - Tried taking tamoxifen  during the day without improvement in symptoms - Uses BC powders, usually one at night, occasionally two, but not every night, to manage side effects - Avoids Tylenol  due to constipation  Musculoskeletal pain - Lower back pain on the left side for approximately two weeks - Pain is adjacent to the spine, not in the joints or the spine itself - Pain worsens with bending - No history of injury - No loss of bowel or bladder function, numbness, or tingling - Has a prescription for baclofen  but has not used it recently - Physical activity is limited due to back pain - Difficulty moving around the house  Psoriasis - Chronic psoriasis, never completely resolves, persistent even in warm weather - Uses topical cream for management - Requests refill of topical cream  Vision changes - Worsening vision - Sees an eye doctor annually  Parathyroid  condition - Has not seen an endocrinologist recently for parathyroid  condition  General health review - No issues with blood pressure, headaches, chest pain, or leg swelling       Most recent fall risk  assessment:    07/17/2023    3:33 PM  Fall Risk   Falls in the past year? 0  Number falls in past yr: 0  Injury with Fall? 0  Risk for fall due to : No Fall Risks  Follow up Falls prevention discussed;Education provided;Falls evaluation completed     Most recent depression screenings:    07/17/2023    3:32 PM 06/05/2023   10:59 AM  PHQ 2/9 Scores  PHQ - 2 Score 0 0  PHQ- 9 Score 2 2    Vision:Within last year and Dental: No current dental problems  Patient Active Problem List   Diagnosis Date Noted   Mixed simple and mucopurulent chronic bronchitis (HCC) 11/26/2023   Obesity, morbid (HCC) 11/26/2023   Vitamin D  deficiency 11/07/2022   Status post parathyroidectomy 09/03/2022   Primary hyperparathyroidism (HCC) 07/30/2022   Hyperparathyroidism, primary (HCC) 07/27/2022   Gastroesophageal reflux disease without esophagitis 05/08/2022   Irritable bowel syndrome with constipation 05/08/2022   Hypercalcemia 03/25/2022   Left thyroid  nodule 03/25/2022   Mixed hyperlipidemia 07/10/2021   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   BMI 39.0-39.9,adult 07/06/2021   Primary hypertension 07/06/2021   History of retinal detachment 07/06/2021   Arthritis 07/06/2021   Environmental allergies 07/06/2021   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)   Dyspnea    With exertion at times   GERD (gastroesophageal reflux disease)  Headache    HTN (hypertension)    Personal history of radiation therapy 2022   Psoriasis    Past Surgical History:  Procedure Laterality Date   BREAST BIOPSY Right    1996, 2006, 2021   BREAST LUMPECTOMY Left 2022   DCIS   CESAREAN SECTION     EYE SURGERY     PARATHYROIDECTOMY N/A 07/30/2022   Procedure: NECK EXPLORATION WITH PARATHYROIDECTOMY;  Surgeon: Eletha Boas, MD;  Location: [facility];  Service: General;  Laterality: N/A;   TUBAL LIGATION     Social  History   Tobacco Use   Smoking status: Former    Current packs/day: 2.00    Average packs/day: 2.0 packs/day for 17.0 years (34.0 ttl pk-yrs)    Types: Cigarettes   Smokeless tobacco: Never  Vaping Use   Vaping status: Never Used  Substance Use Topics   Alcohol use: Not Currently   Drug use: Never   Social History   Socioeconomic History   Marital status: Married    Spouse name: Not on file   Number of children: 1   Years of education: Not on file   Highest education level: GED or equivalent  Occupational History   Not on file  Tobacco Use   Smoking status: Former    Current packs/day: 2.00    Average packs/day: 2.0 packs/day for 17.0 years (34.0 ttl pk-yrs)    Types: Cigarettes   Smokeless tobacco: Never  Vaping Use   Vaping status: Never Used  Substance and Sexual Activity   Alcohol use: Not Currently   Drug use: Never   Sexual activity: Not Currently    Birth control/protection: Post-menopausal  Other Topics Concern   Not on file  Social History Narrative   Not on file   Social Drivers of Health   Financial Resource Strain: Low Risk  (11/22/2023)   Overall Financial Resource Strain (CARDIA)    Difficulty of Paying Living Expenses: Not very hard  Food Insecurity: No Food Insecurity (11/22/2023)   Hunger Vital Sign    Worried About Running Out of Food in the Last Year: Never true    Ran Out of Food in the Last Year: Never true  Transportation Needs: No Transportation Needs (11/22/2023)   PRAPARE - Administrator, Civil Service (Medical): No    Lack of Transportation (Non-Medical): No  Physical Activity: Inactive (11/22/2023)   Exercise Vital Sign    Days of Exercise per Week: 0 days    Minutes of Exercise per Session: Not on file  Stress: No Stress Concern Present (11/22/2023)   [employer] of Occupational Health - Occupational Stress Questionnaire    Feeling of Stress: Not at all  Social Connections: Moderately Isolated (11/22/2023)    Social Connection and Isolation Panel    Frequency of Communication with Friends and Family: More than three times a week    Frequency of Social Gatherings with Friends and Family: Twice a week    Attends Religious Services: Never    Database administrator or Organizations: No    Attends Engineer, structural: Not on file    Marital Status: Married  Catering manager Violence: Not At Risk (07/17/2023)   Humiliation, Afraid, Rape, and Kick questionnaire    Fear of Current or Ex-Partner: No    Emotionally Abused: No    Physically Abused: No    Sexually Abused: No   Family Status  Relation Name Status   Mother  Alive   Father  Alive   Brother  Alive   Son  Alive   MGM  Deceased   MGF  Deceased   PGM  Deceased   PGF  Deceased  No partnership data on file   Family History  Problem Relation Age of Onset   Breast cancer Mother    Heart attack Mother    Bone cancer Father    Obesity Son    Breast cancer Maternal Grandmother    No Known Allergies    Patient Care Team: Abed Schar, Rock HERO, FNP as PCP - General (Family Medicine) Rogers Hai, MD as Medical Oncologist (Medical Oncology)   Outpatient Medications Prior to Visit  Medication Sig   furosemide  (LASIX ) 20 MG tablet TAKE 1 TABLET BY MOUTH DAILY   olmesartan  (BENICAR ) 40 MG tablet TAKE 1 TABLET BY MOUTH DAILY   omeprazole  (PRILOSEC) 40 MG capsule TAKE 1 CAPSULE BY MOUTH DAILY   Red Yeast Rice 600 MG CAPS Take 2 capsules by mouth daily at 6 (six) AM.   tamoxifen  (NOLVADEX ) 20 MG tablet Take 20 mg by mouth daily.   [DISCONTINUED] betamethasone dipropionate (DIPROLENE) 0.05 % ointment Apply topically daily.   [DISCONTINUED] nystatin  cream (MYCOSTATIN ) APPLY TOPICALLY TO AFFECTED  AREA(S) TWICE DAILY   No facility-administered medications prior to visit.    ROS per HPI      Objective:     BP 128/70 (Cuff Size: Normal)   Pulse (!) 59   Temp (!) 97.3 F (36.3 C)   Ht 5' 8 (1.727 m)   Wt 261 lb 3.2 oz  (118.5 kg)   SpO2 95%   BMI 39.72 kg/m  BP Readings from Last 3 Encounters:  11/26/23 128/70  06/05/23 117/62  03/13/23 (!) 120/56   Wt Readings from Last 3 Encounters:  11/26/23 261 lb 3.2 oz (118.5 kg)  07/17/23 263 lb (119.3 kg)  06/05/23 263 lb 12.8 oz (119.7 kg)   SpO2 Readings from Last 3 Encounters:  11/26/23 95%  06/05/23 96%  03/13/23 98%      Physical Exam Vitals and nursing note reviewed.  Constitutional:      General: She is not in acute distress.    Appearance: Normal appearance. She is well-developed and well-groomed. She is morbidly obese. She is not ill-appearing, toxic-appearing or diaphoretic.  HENT:     Head: Normocephalic and atraumatic.     Jaw: There is normal jaw occlusion.     Right Ear: Hearing, tympanic membrane, ear canal and external ear normal.     Left Ear: Hearing, tympanic membrane, ear canal and external ear normal.     Nose: Nose normal.     Mouth/Throat:     Lips: Pink.     Mouth: Mucous membranes are moist.     Pharynx: Oropharynx is clear. Uvula midline.  Eyes:     General: Lids are normal.     Extraocular Movements: Extraocular movements intact.     Conjunctiva/sclera: Conjunctivae normal.     Pupils: Pupils are equal, round, and reactive to light.  Neck:     Thyroid : No thyroid  mass, thyromegaly or thyroid  tenderness.     Vascular: No carotid bruit or JVD.     Trachea: Trachea and phonation normal.  Cardiovascular:     Rate and Rhythm: Normal rate and regular rhythm.     Chest Wall: PMI is not displaced.     Pulses: Normal pulses.     Heart sounds: Normal heart sounds. No murmur heard.    No friction rub.  No gallop.  Pulmonary:     Effort: Pulmonary effort is normal. No respiratory distress.     Breath sounds: Normal breath sounds. No wheezing.  Abdominal:     General: Abdomen is protuberant. Bowel sounds are normal.     Palpations: Abdomen is soft.  Musculoskeletal:     Cervical back: Normal range of motion and neck  supple.     Right lower leg: No edema.     Left lower leg: No edema.  Lymphadenopathy:     Cervical: No cervical adenopathy.  Skin:    General: Skin is warm and dry.     Capillary Refill: Capillary refill takes less than 2 seconds.     Coloration: Skin is not cyanotic, jaundiced or pale.     Findings: No rash.  Neurological:     General: No focal deficit present.     Mental Status: She is alert and oriented to person, place, and time.     Sensory: Sensation is intact.     Motor: Motor function is intact.     Coordination: Coordination is intact.     Gait: Gait is intact.     Deep Tendon Reflexes: Reflexes are normal and symmetric.  Psychiatric:        Attention and Perception: Attention and perception normal.        Mood and Affect: Mood and affect normal.        Speech: Speech normal.        Behavior: Behavior normal. Behavior is cooperative.        Thought Content: Thought content normal.        Cognition and Memory: Cognition and memory normal.        Judgment: Judgment normal.       Last CBC Lab Results  Component Value Date   WBC 8.4 08/29/2023   HGB 12.5 08/29/2023   HCT 39.8 08/29/2023   MCV 89.0 08/29/2023   MCH 28.0 08/29/2023   RDW 13.4 08/29/2023   PLT 288 08/29/2023   Last metabolic panel Lab Results  Component Value Date   GLUCOSE 99 08/29/2023   NA 138 08/29/2023   K 4.0 08/29/2023   CL 106 08/29/2023   CO2 22 08/29/2023   BUN 21 08/29/2023   CREATININE 0.95 08/29/2023   GFRNONAA >60 08/29/2023   CALCIUM  9.7 08/29/2023   PROT 7.4 08/29/2023   ALBUMIN 3.5 08/29/2023   LABGLOB 2.9 06/05/2023   AGRATIO 1.4 05/30/2022   BILITOT 0.7 08/29/2023   ALKPHOS 69 08/29/2023   AST 16 08/29/2023   ALT 17 08/29/2023   ANIONGAP 10 08/29/2023   Last lipids Lab Results  Component Value Date   CHOL 250 (H) 06/05/2023   HDL 36 (L) 06/05/2023   LDLCALC 170 (H) 06/05/2023   TRIG 233 (H) 06/05/2023   CHOLHDL 6.9 (H) 06/05/2023   Last hemoglobin A1c No  results found for: HGBA1C Last thyroid  functions Lab Results  Component Value Date   TSH 1.320 06/05/2023   T4TOTAL 5.8 06/05/2023   Last vitamin D  Lab Results  Component Value Date   VD25OH 18.1 (L) 06/05/2023   Last vitamin B12 and Folate Lab Results  Component Value Date   VITAMINB12 224 09/05/2022   FOLATE 8.0 09/05/2022        Assessment & Plan:    Routine Health Maintenance and Physical Exam  Immunization History  Administered Date(s) Administered   PFIZER(Purple Top)SARS-COV-2 Vaccination 01/14/2020    Health Maintenance  Topic Date Due  COVID-19 Vaccine (2 - Pfizer risk series) 12/12/2023 (Originally 02/04/2020)   Lung Cancer Screening  02/07/2024 (Originally 03/03/2006)   Pneumococcal Vaccine: 50+ Years (1 of 2 - PCV) 02/07/2024 (Originally 03/04/1975)   Hepatitis C Screening  02/07/2024 (Originally 03/03/1974)   Zoster Vaccines- Shingrix (1 of 2) 02/26/2024 (Originally 03/04/1975)   DTaP/Tdap/Td (1 - Tdap) 06/04/2024 (Originally 03/04/1975)   INFLUENZA VACCINE  12/26/2023   Medicare Annual Wellness (AWV)  07/16/2024   MAMMOGRAM  07/21/2024   Fecal DNA (Cologuard)  09/10/2024   DEXA SCAN  12/29/2024   Hepatitis B Vaccines  Aged Out   HPV VACCINES  Aged Out   Meningococcal B Vaccine  Aged Out    Discussed health benefits of physical activity, and encouraged her to engage in regular exercise appropriate for her age and condition.  Problem List Items Addressed This Visit       Cardiovascular and Mediastinum   Primary hypertension - Primary   Relevant Orders   CBC with Differential/Platelet   CMP14+EGFR     Respiratory   Mixed simple and mucopurulent chronic bronchitis (HCC)   Relevant Orders   CBC with Differential/Platelet     Other   Mixed hyperlipidemia   Relevant Orders   CMP14+EGFR   Lipid panel   Vitamin D  deficiency   Relevant Orders   CMP14+EGFR   Vitamin D , 25-hydroxy   Obesity, morbid (HCC)   Relevant Orders   CBC with  Differential/Platelet   CMP14+EGFR   Lipid panel   Thyroid  Panel With TSH   Vitamin D , 25-hydroxy   Other Visit Diagnoses       Annual physical exam       Relevant Orders   CBC with Differential/Platelet   CMP14+EGFR     Psoriasis       Relevant Medications   betamethasone dipropionate (DIPROLENE) 0.05 % ointment     Former heavy tobacco smoker       Relevant Orders   Ambulatory Referral Lung Cancer Screening [facility]         Breast Cancer She is on tamoxifen  20 mg, restarted on June 10, and reports side effects including aches, pains, and night sweats. She manages these with BC powders, containing aspirin and caffeine, but is advised to be cautious due to potential gastritis risk. - Continue tamoxifen  20 mg daily - Advise caution with BC powders due to aspirin content and potential for gastritis - Monitor and report any worsening symptoms  Lower Back Pain She reports left lower back pain for two weeks, likely muscular as it is not associated with the spine. Pain worsens with bending and is not related to any known injury. No neurological symptoms reported. Baclofen  is considered for its muscle relaxant properties. - Recommend trial of baclofen  1-2 times daily as needed for muscle relaxation - Advise to report if pain worsens or does not improve with treatment  Psoriasis She experiences constant psoriasis flares, which do not completely resolve even in the heat. - Send prescription for psoriasis cream to [facility] with refills  General Health Maintenance She is up to date with most health screenings. She declined shingles and pneumonia vaccines. Lung cancer screening is recommended and agreed upon. Cologuard and mammogram screenings are current. DEXA scan is due next year. - Order lung cancer screening - Perform routine lab work including thyroid  labs, calcium , vitamin D , and CBC  Advance Directive She requests to update her advance directive and obtain a legal  document for it. - Complete and provide updated  advance directive document  Follow-up She follows up with oncology every six months, next expected in September or October. She has not seen endocrinology recently for parathyroid  follow-up. - Follow up with oncology in September or October - Check thyroid  labs and calcium  levels - If needed, provide referral to dermatologist for skin evaluation       Return in about 1 year (around 11/25/2024) for Annual Physical.     Rosaline Bruns, FNP

## 2023-11-27 ENCOUNTER — Encounter: Payer: Self-pay | Admitting: Family Medicine

## 2023-11-27 ENCOUNTER — Ambulatory Visit: Payer: Self-pay | Admitting: Family Medicine

## 2023-11-27 LAB — CBC WITH DIFFERENTIAL/PLATELET
Basophils Absolute: 0.1 10*3/uL (ref 0.0–0.2)
Basos: 1 %
EOS (ABSOLUTE): 0.2 10*3/uL (ref 0.0–0.4)
Eos: 3 %
Hematocrit: 42.3 % (ref 34.0–46.6)
Hemoglobin: 13.1 g/dL (ref 11.1–15.9)
Immature Grans (Abs): 0 10*3/uL (ref 0.0–0.1)
Immature Granulocytes: 0 %
Lymphocytes Absolute: 1.9 10*3/uL (ref 0.7–3.1)
Lymphs: 26 %
MCH: 27.4 pg (ref 26.6–33.0)
MCHC: 31 g/dL — ABNORMAL LOW (ref 31.5–35.7)
MCV: 89 fL (ref 79–97)
Monocytes Absolute: 0.5 10*3/uL (ref 0.1–0.9)
Monocytes: 7 %
Neutrophils Absolute: 4.6 10*3/uL (ref 1.4–7.0)
Neutrophils: 63 %
Platelets: 268 10*3/uL (ref 150–450)
RBC: 4.78 x10E6/uL (ref 3.77–5.28)
RDW: 13.2 % (ref 11.7–15.4)
WBC: 7.2 10*3/uL (ref 3.4–10.8)

## 2023-11-27 LAB — THYROID PANEL WITH TSH
Free Thyroxine Index: 1.7 (ref 1.2–4.9)
T3 Uptake Ratio: 26 (ref 24–39)
T4, Total: 6.5 ug/dL (ref 4.5–12.0)
TSH: 1.81 u[IU]/mL (ref 0.450–4.500)

## 2023-11-27 LAB — LIPID PANEL
Chol/HDL Ratio: 6.2 ratio — ABNORMAL HIGH (ref 0.0–4.4)
Cholesterol, Total: 231 mg/dL — ABNORMAL HIGH (ref 100–199)
HDL: 37 mg/dL — ABNORMAL LOW (ref >39)
LDL Chol Calc (NIH): 162 mg/dL — ABNORMAL HIGH (ref 0–99)
Triglycerides: 176 mg/dL — ABNORMAL HIGH (ref 0–149)
VLDL Cholesterol Cal: 32 mg/dL (ref 5–40)

## 2023-11-27 LAB — CMP14+EGFR
ALT: 14 IU/L (ref 0–32)
AST: 17 IU/L (ref 0–40)
Albumin: 4 g/dL (ref 3.9–4.9)
Alkaline Phosphatase: 86 IU/L (ref 44–121)
BUN/Creatinine Ratio: 17 (ref 12–28)
BUN: 16 mg/dL (ref 8–27)
Bilirubin Total: 0.4 mg/dL (ref 0.0–1.2)
CO2: 20 mmol/L (ref 20–29)
Calcium: 9.9 mg/dL (ref 8.7–10.3)
Chloride: 105 mmol/L (ref 96–106)
Creatinine, Ser: 0.95 mg/dL (ref 0.57–1.00)
Globulin, Total: 3.1 g/dL (ref 1.5–4.5)
Glucose: 93 mg/dL (ref 70–99)
Potassium: 4.8 mmol/L (ref 3.5–5.2)
Sodium: 141 mmol/L (ref 134–144)
Total Protein: 7.1 g/dL (ref 6.0–8.5)
eGFR: 66 mL/min/{1.73_m2} (ref >59)

## 2023-11-27 LAB — VITAMIN D 25 HYDROXY (VIT D DEFICIENCY, FRACTURES): Vit D, 25-Hydroxy: 26 ng/mL — AB (ref 30.0–100.0)

## 2023-12-01 MED ORDER — CLOBETASOL PROPIONATE 0.05 % EX CREA: 1.0000 | TOPICAL_CREAM | Freq: Two times a day (BID) | CUTANEOUS | 11 refills | Status: DC

## 2023-12-01 NOTE — Addendum Note (Signed)
 Addended by: SEVERA ROCK HERO on: 12/01/2023 02:23 PM   Modules accepted: Orders

## 2024-02-13 ENCOUNTER — Other Ambulatory Visit: Payer: Self-pay | Admitting: Family Medicine

## 2024-02-13 DIAGNOSIS — I10 Essential (primary) hypertension: Secondary | ICD-10-CM

## 2024-03-05 ENCOUNTER — Inpatient Hospital Stay: Attending: Oncology

## 2024-03-05 DIAGNOSIS — M8580 Other specified disorders of bone density and structure, unspecified site: Secondary | ICD-10-CM | POA: Diagnosis not present

## 2024-03-05 DIAGNOSIS — D0512 Intraductal carcinoma in situ of left breast: Secondary | ICD-10-CM | POA: Insufficient documentation

## 2024-03-05 DIAGNOSIS — D649 Anemia, unspecified: Secondary | ICD-10-CM | POA: Diagnosis not present

## 2024-03-05 DIAGNOSIS — R1030 Lower abdominal pain, unspecified: Secondary | ICD-10-CM | POA: Diagnosis not present

## 2024-03-05 DIAGNOSIS — Z923 Personal history of irradiation: Secondary | ICD-10-CM | POA: Diagnosis not present

## 2024-03-05 DIAGNOSIS — Z79811 Long term (current) use of aromatase inhibitors: Secondary | ICD-10-CM | POA: Diagnosis not present

## 2024-03-05 LAB — CBC WITH DIFFERENTIAL/PLATELET
Abs Immature Granulocytes: 0.03 K/uL (ref 0.00–0.07)
Basophils Absolute: 0.1 K/uL (ref 0.0–0.1)
Basophils Relative: 1 %
Eosinophils Absolute: 0.2 K/uL (ref 0.0–0.5)
Eosinophils Relative: 2 %
HCT: 39 % (ref 36.0–46.0)
Hemoglobin: 12.4 g/dL (ref 12.0–15.0)
Immature Granulocytes: 0 %
Lymphocytes Relative: 21 %
Lymphs Abs: 2 K/uL (ref 0.7–4.0)
MCH: 28.5 pg (ref 26.0–34.0)
MCHC: 31.8 g/dL (ref 30.0–36.0)
MCV: 89.7 fL (ref 80.0–100.0)
Monocytes Absolute: 0.6 K/uL (ref 0.1–1.0)
Monocytes Relative: 6 %
Neutro Abs: 6.5 K/uL (ref 1.7–7.7)
Neutrophils Relative %: 70 %
Platelets: 262 K/uL (ref 150–400)
RBC: 4.35 MIL/uL (ref 3.87–5.11)
RDW: 13.5 % (ref 11.5–15.5)
WBC: 9.3 K/uL (ref 4.0–10.5)
nRBC: 0 % (ref 0.0–0.2)

## 2024-03-05 LAB — COMPREHENSIVE METABOLIC PANEL WITH GFR
ALT: 16 U/L (ref 0–44)
AST: 20 U/L (ref 15–41)
Albumin: 3.9 g/dL (ref 3.5–5.0)
Alkaline Phosphatase: 78 U/L (ref 38–126)
Anion gap: 13 (ref 5–15)
BUN: 13 mg/dL (ref 8–23)
CO2: 24 mmol/L (ref 22–32)
Calcium: 9.8 mg/dL (ref 8.9–10.3)
Chloride: 107 mmol/L (ref 98–111)
Creatinine, Ser: 0.88 mg/dL (ref 0.44–1.00)
GFR, Estimated: >60 mL/min (ref >60)
Glucose, Bld: 106 mg/dL — ABNORMAL HIGH (ref 70–99)
Potassium: 4.2 mmol/L (ref 3.5–5.1)
Sodium: 145 mmol/L (ref 135–145)
Total Bilirubin: 0.5 mg/dL (ref 0.0–1.2)
Total Protein: 7 g/dL (ref 6.5–8.1)

## 2024-03-05 LAB — IRON AND TIBC
Iron: 90 ug/dL (ref 28–170)
Saturation Ratios: 29 % (ref 10.4–31.8)
TIBC: 309 ug/dL (ref 250–450)
UIBC: 219 ug/dL

## 2024-03-05 LAB — FERRITIN: Ferritin: 252 ng/mL (ref 11–307)

## 2024-03-12 ENCOUNTER — Other Ambulatory Visit (HOSPITAL_COMMUNITY): Payer: Self-pay | Admitting: Oncology

## 2024-03-12 ENCOUNTER — Inpatient Hospital Stay: Admitting: Oncology

## 2024-03-12 VITALS — BP 131/63 | HR 85 | Temp 98.0°F | Resp 16 | Wt 258.8 lb

## 2024-03-12 DIAGNOSIS — D0512 Intraductal carcinoma in situ of left breast: Secondary | ICD-10-CM | POA: Diagnosis not present

## 2024-03-12 DIAGNOSIS — R1030 Lower abdominal pain, unspecified: Secondary | ICD-10-CM | POA: Diagnosis not present

## 2024-03-12 DIAGNOSIS — D649 Anemia, unspecified: Secondary | ICD-10-CM | POA: Diagnosis not present

## 2024-03-12 NOTE — Assessment & Plan Note (Addendum)
-   Patient discontinued tamoxifen  in February 2025 due to side effects.  She is not interested in restarting. - Unable to obtain BCI testing due to DCIS.  Noninvasive cancers are low risk for recurrence and BCI testing is not warranted. - Most recent mammo was from 07/22/2023 which showed no abnormality at site of fullness/tenderness in left axilla and no evidence of malignancy in either breast.  Recommend screening mammogram in February 2026. -Lab work shows normal CBC and CMP. -Return to clinic in 6 months with labs and see provider in clinic.

## 2024-03-12 NOTE — Progress Notes (Unsigned)
 [facility] OFFICE PROGRESS NOTE  Amber Rock HERO, Amber Diaz  ASSESSMENT & PLAN:    Assessment & Plan Ductal carcinoma in situ (DCIS) of left breast - Patient discontinued tamoxifen  in February 2025 due to side effects.  She is not interested in restarting. - Unable to obtain BCI testing due to DCIS.  Noninvasive cancers are low risk for recurrence and BCI testing is not warranted. - Most recent mammo was from 07/22/2023 which showed no abnormality at site of fullness/tenderness in left axilla and no evidence of malignancy in either breast.  Recommend screening mammogram in February 2026. -Lab work shows normal CBC and CMP. -Return to clinic in 6 months with labs and see provider in clinic. Normocytic anemia Previously had some iron deficiency without anemia.  Iron saturations from 03/05/2024 or 29 within normal TIBC.  Ferritin 252.  She does not need any additional iron at this time.  Hemoglobin is normal. Lower abdominal pain Last colonoscopy was greater than 10 years ago.  Reports intermittent left middle upper quadrant pain over the past several years but more constant over the past 2 years.  Denies any changes to her bowels.  Reports occasional constipation.  Will send referral to GI.  Orders Placed This Encounter  Procedures   MM DIAG BREAST TOMO BILATERAL    Standing Status:   Future    Expected Date:   03/19/2024    Expiration Date:   03/12/2025    Reason for Exam (SYMPTOM  OR DIAGNOSIS REQUIRED):   right breast mass to 10 o'clock 1 inch from right nipple    Preferred imaging location?:   [facility]   CBC with Differential    Standing Status:   Future    Expected Date:   09/10/2024    Expiration Date:   03/12/2025   Comprehensive metabolic panel    Standing Status:   Future    Expected Date:   09/10/2024    Expiration Date:   03/12/2025   Ambulatory referral to Gastroenterology    Referral Priority:   Routine    Referral Type:   Consultation    Referral  Reason:   Specialty Services Required    Number of Visits Requested:   1    INTERVAL HISTORY: Amber Diaz is a 68 y.o. female seen for follow-up of left breast DCIS.  She denies any surgeries, hospitalizations or changes in her baseline health.   Reports she stopped taking tamoxifen  in February due to side effects including severe hot flashes and joint pain all over.  Reports her side effects have not significantly improved since she stopped tamoxifen .  She took for a total of 3 years although she has been on several different antihormone pills.   Most recent mammogram is from 07/22/2023 after she presented to her PCP for edema and puffiness to her left axilla.  Mammogram and ultrasound were both negative for abnormality or malignancy.  Recommended follow-up screening mammogram in 1 year.   Had a bone density scan completed on 01/03/2023 which showed a T-score of -1.1 with improvement from previous although she remains osteopenic.  She is currently not taking any over-the-counter supplements.   She reports new onset mid lower abdominal pain that she has had in the past but is now more constant.  Reports symptoms have been present for about 2 months.  Reports constipation at times.  She has had a colonoscopy but it is been greater than 10 years.  She is interested in  being referred back to GI she denies any associated nausea or vomiting.  Appetite is 75% energy levels are 50%.  She denies any bright red blood per rectum, melena or hematochezia.  We reviewed CBC and CMP.  SUMMARY OF HEMATOLOGIC HISTORY: Oncology History Overview Note  Left breast high-grade DCIS, ER positive: - Screening mammogram on 08/12/2020 with asymmetry in the outer left breast in [place], [place] . - Diagnostic mammogram/ultrasound and US  biopsy on 09/05/2020, pathology consistent with high-grade DCIS, ER/PR positive. - Lumpectomy and SLN biopsy on 10/03/2020 - Pathology with 1.4 cm DCIS, margins negative, 4/4 lymph  nodes negative. - Status post XRT to the left breast - She was reportedly negative for genetic testing.  She had previous right breast biopsies x4 (1996, 2006, 2021). - She was started on anastrozole, developed nausea, vomiting and abdominal pain - Exemestane started on 01/10/2021, discontinued in the first week of February 2023 secondary to hypertension. - Letrozole  started on 07/17/2021. - She has some hot flashes which started after taking anastrozole but never went away.  They are tolerable. - She also has joint pains in the ankles, knees, hips, shoulders and wrists which are chronic and stable.    Social/family history: - She is retired Print production planner.  Recently moved from [place]  to [place] . - She smoked 2 packs/day from age 39 through 1 and quit. - Mother had breast cancer at age 1 and 50.  Maternal grandmother had breast cancer at age 43.  Father had cancer on cervical spine.  Primary unknown.     Ductal carcinoma in situ (DCIS) of left breast  07/06/2021 Initial Diagnosis   Ductal carcinoma in situ (DCIS) of left breast   07/17/2021 Cancer Staging   Staging form: Breast, AJCC 8th Edition - Clinical stage from 07/17/2021: Stage 0 (cTis (DCIS), cN0, cM0, G3, ER+, PR+, HER2: Not Assessed) - Signed by Rogers Hai, MD on 07/17/2021 Stage prefix: Initial diagnosis Nuclear grade: G3 Histologic grading system: 3 grade system      CBC    Component Value Date/Time   WBC 9.3 03/05/2024 1023   RBC 4.35 03/05/2024 1023   HGB 12.4 03/05/2024 1023   HGB 13.1 11/26/2023 1043   HCT 39.0 03/05/2024 1023   HCT 42.3 11/26/2023 1043   PLT 262 03/05/2024 1023   PLT 268 11/26/2023 1043   MCV 89.7 03/05/2024 1023   MCV 89 11/26/2023 1043   MCH 28.5 03/05/2024 1023   MCHC 31.8 03/05/2024 1023   RDW 13.5 03/05/2024 1023   RDW 13.2 11/26/2023 1043   LYMPHSABS 2.0 03/05/2024 1023   LYMPHSABS 1.9 11/26/2023 1043   MONOABS 0.6 03/05/2024 1023   EOSABS 0.2 03/05/2024 1023    EOSABS 0.2 11/26/2023 1043   BASOSABS 0.1 03/05/2024 1023   BASOSABS 0.1 11/26/2023 1043       Latest Ref Rng & Units 03/05/2024   10:23 AM 11/26/2023   10:43 AM 08/29/2023   11:44 AM  CMP  Glucose 70 - 99 mg/dL 893  93  99   BUN 8 - 23 mg/dL 13  16  21    Creatinine 0.44 - 1.00 mg/dL 9.11  9.04  9.04   Sodium 135 - 145 mmol/L 145  141  138   Potassium 3.5 - 5.1 mmol/L 4.2  4.8  4.0   Chloride 98 - 111 mmol/L 107  105  106   CO2 22 - 32 mmol/L 24  20  22    Calcium  8.9 - 10.3 mg/dL 9.8  9.9  9.7   Total Protein 6.5 - 8.1 g/dL 7.0  7.1  7.4   Total Bilirubin 0.0 - 1.2 mg/dL 0.5  0.4  0.7   Alkaline Phos 38 - 126 U/L 78  86  69   AST 15 - 41 U/L 20  17  16    ALT 0 - 44 U/L 16  14  17       Lab Results  Component Value Date   FERRITIN 252 03/05/2024   VITAMINB12 224 09/05/2022    Vitals:   03/12/24 1131  BP: 131/63  Pulse: 85  Resp: 16  Temp: 98 F (36.7 C)  SpO2: 91%    Review of System:  Review of Systems  Constitutional:  Positive for malaise/fatigue.  Respiratory:  Positive for cough.   Gastrointestinal:  Positive for abdominal pain and constipation. Negative for heartburn, nausea and vomiting.  Neurological:  Positive for dizziness. Negative for headaches.    Physical Exam: Physical Exam Constitutional:      Appearance: Normal appearance.  HENT:     Head: Normocephalic and atraumatic.  Eyes:     Pupils: Pupils are equal, round, and reactive to light.  Cardiovascular:     Rate and Rhythm: Normal rate and regular rhythm.     Heart sounds: Normal heart sounds. No murmur heard. Pulmonary:     Effort: Pulmonary effort is normal.     Breath sounds: Normal breath sounds. No wheezing.  Abdominal:     General: Bowel sounds are normal. There is no distension. There are no signs of injury.     Palpations: Abdomen is soft.     Tenderness: There is no abdominal tenderness.  Musculoskeletal:        General: Normal range of motion.     Cervical back: Normal range  of motion.  Skin:    General: Skin is warm and dry.     Findings: No rash.  Neurological:     Mental Status: She is alert and oriented to person, place, and time.     Gait: Gait is intact.  Psychiatric:        Mood and Affect: Mood and affect normal.        Cognition and Memory: Memory normal.        Judgment: Judgment normal.      I spent 20 minutes dedicated to the care of this patient (face-to-face and non-face-to-face) on the date of the encounter to include what is described in the assessment and plan.,  Delon Hope, NP 03/18/2024 8:30 AM

## 2024-03-23 ENCOUNTER — Encounter: Payer: Self-pay | Admitting: Internal Medicine

## 2024-03-23 ENCOUNTER — Ambulatory Visit (HOSPITAL_COMMUNITY)
Admission: RE | Admit: 2024-03-23 | Discharge: 2024-03-23 | Disposition: A | Discharge status: Home / Self Care | Source: Ambulatory Visit | Attending: Oncology | Admitting: Oncology

## 2024-03-23 ENCOUNTER — Encounter (HOSPITAL_COMMUNITY): Payer: Self-pay

## 2024-03-23 DIAGNOSIS — D0512 Intraductal carcinoma in situ of left breast: Secondary | ICD-10-CM | POA: Diagnosis present

## 2024-03-24 ENCOUNTER — Ambulatory Visit: Payer: Self-pay | Admitting: Oncology

## 2024-05-24 ENCOUNTER — Encounter: Payer: Self-pay | Admitting: *Deleted

## 2024-05-26 ENCOUNTER — Encounter: Payer: Self-pay | Admitting: Internal Medicine

## 2024-05-26 ENCOUNTER — Telehealth (INDEPENDENT_AMBULATORY_CARE_PROVIDER_SITE_OTHER): Payer: Self-pay

## 2024-05-26 ENCOUNTER — Ambulatory Visit: Admitting: Internal Medicine

## 2024-05-26 VITALS — BP 133/83 | HR 88 | Temp 97.3°F | Ht 69.0 in | Wt 261.2 lb

## 2024-05-26 DIAGNOSIS — K219 Gastro-esophageal reflux disease without esophagitis: Secondary | ICD-10-CM | POA: Diagnosis not present

## 2024-05-26 DIAGNOSIS — R1032 Left lower quadrant pain: Secondary | ICD-10-CM | POA: Diagnosis not present

## 2024-05-26 DIAGNOSIS — K581 Irritable bowel syndrome with constipation: Secondary | ICD-10-CM

## 2024-05-26 DIAGNOSIS — K5900 Constipation, unspecified: Secondary | ICD-10-CM

## 2024-05-26 DIAGNOSIS — Z1211 Encounter for screening for malignant neoplasm of colon: Secondary | ICD-10-CM

## 2024-05-26 MED ORDER — LUBIPROSTONE 8 MCG PO CAPS: 8.0000 ug | ORAL_CAPSULE | Freq: Two times a day (BID) | ORAL | 3 refills | Status: AC

## 2024-05-26 MED ORDER — PEG 3350-KCL-NA BICARB-NACL 420 G PO SOLR: 4000.0000 mL | Freq: Once | ORAL | 0 refills | Status: AC

## 2024-05-26 NOTE — Telephone Encounter (Signed)
 Scheduled pt for CT on 06/15/2024 at 8:30am. LVM for pt with appt details.

## 2024-05-26 NOTE — Telephone Encounter (Signed)
 Spoke with patient in the office, scheduled TCS for 06/01/2024 at 1:30pm. Rx sent to pharmacy. Instructions given to patient.

## 2024-05-26 NOTE — Progress Notes (Signed)
 "   Referring Provider: Severa Rock HERO, FNP Primary Care Physician:  Severa Rock HERO, FNP Primary GI:  Dr. Cindie  Chief Complaint  Patient presents with   Follow-up    Patient here today due to having issues with normocytic anemia, and abdominal pain. Patient also has history of chronic constipation, which she is not on any constipation medication. Patient last hgb was done on 03/05/2024 at 12.4 and Fe sat% at that time was 29%. She does not take any po Fe. Patient says she does have dizziness, fatigue,and shortness of breath. Denies feeling like she will pass out, and denies chest pain, or palpitations.      HPI:   Amber Diaz is a 68 y.o. female who presents to clinic today for follow-up visit.  Last seen September 2024.  Chronic GERD: Currently taking omeprazole  daily with good symptom control.  Occasional breakthrough symptoms depending on what she eats.  No dysphagia odynophagia, no epigastric or chest pain.  No melena hematochezia.   Constipation/abdominal pain: Chronic issue, has been on Linzess  145 mcg previously.  Never took this every day as cause significant bowel movements to the point she was unable to leave the house.  Takes every few days.  Now it is unaffordable at $300.  Believes it is not working as well anyway.  Notes associated left lower quadrant abdominal pain, mild, intermittent, improved with bowel movements.  Was given samples of Trulance  on prior visit.  She does not remember if this worked or not.   Colon cancer screening: States she had a colonoscopy over 10 years ago which was normal.   No family history of colorectal malignancy.  No unintentional weight loss.    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)   Dyspnea    With exertion at times   GERD (gastroesophageal reflux disease)    Headache    HTN (hypertension)    Personal history of radiation therapy 2022   Psoriasis      Past Surgical History:  Procedure Laterality Date   BREAST BIOPSY Right    1996, 2006, 2021   BREAST LUMPECTOMY Left 2022   DCIS   CESAREAN SECTION     EYE SURGERY     PARATHYROIDECTOMY N/A 07/30/2022   Procedure: NECK EXPLORATION WITH PARATHYROIDECTOMY;  Surgeon: Eletha Boas, MD;  Location: [facility];  Service: General;  Laterality: N/A;   TUBAL LIGATION      Current Outpatient Medications  Medication Sig Dispense Refill   olmesartan  (BENICAR ) 40 MG tablet TAKE 1 TABLET BY MOUTH DAILY 100 tablet 2   omeprazole  (PRILOSEC) 40 MG capsule TAKE 1 CAPSULE BY MOUTH DAILY 100 capsule 2   Red Yeast Rice 600 MG CAPS Take 2 capsules by mouth daily at 6 (six) AM. (Patient taking differently: Take 4 capsules by mouth daily at 6 (six) AM.)     No current facility-administered medications for this visit.    Allergies as of 05/26/2024   (No Known Allergies)    Family History  Problem Relation Age of Onset   Breast cancer Mother    Heart attack Mother    Bone cancer Father    Obesity Son    Breast cancer Maternal Grandmother     Social History   Socioeconomic History   Marital status: Married    Spouse name: Not on file   Number of children: 1   Years of  education: Not on file   Highest education level: GED or equivalent  Occupational History   Not on file  Tobacco Use   Smoking status: Former    Current packs/day: 0.00    Average packs/day: 2.0 packs/day for 17.0 years (34.0 ttl pk-yrs)    Types: Cigarettes    Start date: 05/27/1976    Quit date: 05/27/1993    Years since quitting: 31.0   Smokeless tobacco: Never  Vaping Use   Vaping status: Never Used  Substance and Sexual Activity   Alcohol use: Not Currently   Drug use: Never   Sexual activity: Not Currently    Birth control/protection: Post-menopausal  Other Topics Concern   Not on file  Social History Narrative   Not on file   Social Drivers of Health   Tobacco Use: Medium Risk (05/26/2024)   Patient History     Smoking Tobacco Use: Former    Smokeless Tobacco Use: Never    Passive Exposure: Not on Actuary Strain: Low Risk (11/22/2023)   Overall Financial Resource Strain (CARDIA)    Difficulty of Paying Living Expenses: Not very hard  Food Insecurity: No Food Insecurity (11/22/2023)   Epic    Worried About Radiation Protection Practitioner of Food in the Last Year: Never true    Ran Out of Food in the Last Year: Never true  Transportation Needs: No Transportation Needs (11/22/2023)   Epic    Lack of Transportation (Medical): No    Lack of Transportation (Non-Medical): No  Physical Activity: Inactive (11/22/2023)   Exercise Vital Sign    Days of Exercise per Week: 0 days    Minutes of Exercise per Session: Not on file  Stress: No Stress Concern Present (11/22/2023)   [employer] of Occupational Health - Occupational Stress Questionnaire    Feeling of Stress: Not at all  Social Connections: Moderately Isolated (11/22/2023)   Social Connection and Isolation Panel    Frequency of Communication with Friends and Family: More than three times a week    Frequency of Social Gatherings with Friends and Family: Twice a week    Attends Religious Services: Never    Database Administrator or Organizations: No    Attends Engineer, Structural: Not on file    Marital Status: Married  Depression (PHQ2-9): Low Risk (03/12/2024)   Depression (PHQ2-9)    PHQ-2 Score: 0  Alcohol Screen: Low Risk (07/17/2023)   Alcohol Screen    Last Alcohol Screening Score (AUDIT): 0  Housing: Low Risk (11/22/2023)   Epic    Unable to Pay for Housing in the Last Year: No    Number of Times Moved in the Last Year: 0    Homeless in the Last Year: No  Utilities: Not At Risk (07/17/2023)   [employer] Utilities    Threatened with loss of utilities: No  Health Literacy: Adequate Health Literacy (07/17/2023)   B1300 Health Literacy    Frequency of need for help with medical instructions: Never    Subjective: Review of  Systems  Constitutional:  Negative for chills and fever.  HENT:  Negative for congestion and hearing loss.   Eyes:  Negative for blurred vision and double vision.  Respiratory:  Negative for cough and shortness of breath.   Cardiovascular:  Negative for chest pain and palpitations.  Gastrointestinal:  Positive for abdominal pain. Negative for blood in stool, constipation, diarrhea, heartburn, melena and vomiting.  Genitourinary:  Negative for dysuria and urgency.  Musculoskeletal:  Negative for joint pain and myalgias.  Skin:  Negative for itching and rash.  Neurological:  Negative for dizziness and headaches.  Psychiatric/Behavioral:  Negative for depression. The patient is not nervous/anxious.      Objective: BP 133/83 (BP Location: Left Arm, Patient Position: Sitting)   Pulse 88   Temp (!) 97.3 F (36.3 C) (Temporal)   Ht 5' 9 (1.753 m)   Wt 261 lb 3.2 oz (118.5 kg)   BMI 38.57 kg/m  Physical Exam Constitutional:      Appearance: Normal appearance.  HENT:     Head: Normocephalic and atraumatic.  Eyes:     Extraocular Movements: Extraocular movements intact.     Conjunctiva/sclera: Conjunctivae normal.  Cardiovascular:     Rate and Rhythm: Normal rate and regular rhythm.  Pulmonary:     Effort: Pulmonary effort is normal.     Breath sounds: Normal breath sounds.  Abdominal:     General: Bowel sounds are normal.     Palpations: Abdomen is soft.  Musculoskeletal:        General: No swelling. Normal range of motion.     Cervical back: Normal range of motion and neck supple.  Skin:    General: Skin is warm and dry.     Coloration: Skin is not jaundiced.  Neurological:     General: No focal deficit present.     Mental Status: She is alert and oriented to person, place, and time.  Psychiatric:        Mood and Affect: Mood normal.        Behavior: Behavior normal.      Assessment/Plan:   1.  Chronic GERD-well-controlled on omeprazole  daily. Continue.  No alarm  symptoms today to warrant further investigation with upper endoscopy.   2.  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  Linzess  is unaffordable at this point.  Will start on Amitiza 8 mg twice daily.  Given her worsening left lower quadrant abdominal pain, also has concerns about possible hernia, will order CT abdomen pelvis to further evaluate.   3.  Colon cancer screening- Will schedule for screening colonoscopy.The risks including infection, bleed, or perforation as well as benefits, limitations, alternatives and imponderables have been reviewed with the patient. Questions have been answered. All parties agreeable.      Follow-up after colonoscopy   05/26/2024 10:58 AM  "

## 2024-05-26 NOTE — H&P (View-Only) (Signed)
 "   Referring Provider: Severa Rock HERO, FNP Primary Care Physician:  Severa Rock HERO, FNP Primary GI:  Dr. Cindie  Chief Complaint  Patient presents with   Follow-up    Patient here today due to having issues with normocytic anemia, and abdominal pain. Patient also has history of chronic constipation, which she is not on any constipation medication. Patient last hgb was done on 03/05/2024 at 12.4 and Fe sat% at that time was 29%. She does not take any po Fe. Patient says she does have dizziness, fatigue,and shortness of breath. Denies feeling like she will pass out, and denies chest pain, or palpitations.      HPI:   Amber Diaz is a 68 y.o. female who presents to clinic today for follow-up visit.  Last seen September 2024.  Chronic GERD: Currently taking omeprazole  daily with good symptom control.  Occasional breakthrough symptoms depending on what she eats.  No dysphagia odynophagia, no epigastric or chest pain.  No melena hematochezia.   Constipation/abdominal pain: Chronic issue, has been on Linzess  145 mcg previously.  Never took this every day as cause significant bowel movements to the point she was unable to leave the house.  Takes every few days.  Now it is unaffordable at $300.  Believes it is not working as well anyway.  Notes associated left lower quadrant abdominal pain, mild, intermittent, improved with bowel movements.  Was given samples of Trulance  on prior visit.  She does not remember if this worked or not.   Colon cancer screening: States she had a colonoscopy over 10 years ago which was normal.   No family history of colorectal malignancy.  No unintentional weight loss.    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)   Dyspnea    With exertion at times   GERD (gastroesophageal reflux disease)    Headache    HTN (hypertension)    Personal history of radiation therapy 2022   Psoriasis      Past Surgical History:  Procedure Laterality Date   BREAST BIOPSY Right    1996, 2006, 2021   BREAST LUMPECTOMY Left 2022   DCIS   CESAREAN SECTION     EYE SURGERY     PARATHYROIDECTOMY N/A 07/30/2022   Procedure: NECK EXPLORATION WITH PARATHYROIDECTOMY;  Surgeon: Eletha Boas, MD;  Location: [facility];  Service: General;  Laterality: N/A;   TUBAL LIGATION      Current Outpatient Medications  Medication Sig Dispense Refill   olmesartan  (BENICAR ) 40 MG tablet TAKE 1 TABLET BY MOUTH DAILY 100 tablet 2   omeprazole  (PRILOSEC) 40 MG capsule TAKE 1 CAPSULE BY MOUTH DAILY 100 capsule 2   Red Yeast Rice 600 MG CAPS Take 2 capsules by mouth daily at 6 (six) AM. (Patient taking differently: Take 4 capsules by mouth daily at 6 (six) AM.)     No current facility-administered medications for this visit.    Allergies as of 05/26/2024   (No Known Allergies)    Family History  Problem Relation Age of Onset   Breast cancer Mother    Heart attack Mother    Bone cancer Father    Obesity Son    Breast cancer Maternal Grandmother     Social History   Socioeconomic History   Marital status: Married    Spouse name: Not on file   Number of children: 1   Years of  education: Not on file   Highest education level: GED or equivalent  Occupational History   Not on file  Tobacco Use   Smoking status: Former    Current packs/day: 0.00    Average packs/day: 2.0 packs/day for 17.0 years (34.0 ttl pk-yrs)    Types: Cigarettes    Start date: 05/27/1976    Quit date: 05/27/1993    Years since quitting: 31.0   Smokeless tobacco: Never  Vaping Use   Vaping status: Never Used  Substance and Sexual Activity   Alcohol use: Not Currently   Drug use: Never   Sexual activity: Not Currently    Birth control/protection: Post-menopausal  Other Topics Concern   Not on file  Social History Narrative   Not on file   Social Drivers of Health   Tobacco Use: Medium Risk (05/26/2024)   Patient History     Smoking Tobacco Use: Former    Smokeless Tobacco Use: Never    Passive Exposure: Not on Actuary Strain: Low Risk (11/22/2023)   Overall Financial Resource Strain (CARDIA)    Difficulty of Paying Living Expenses: Not very hard  Food Insecurity: No Food Insecurity (11/22/2023)   Epic    Worried About Radiation Protection Practitioner of Food in the Last Year: Never true    Ran Out of Food in the Last Year: Never true  Transportation Needs: No Transportation Needs (11/22/2023)   Epic    Lack of Transportation (Medical): No    Lack of Transportation (Non-Medical): No  Physical Activity: Inactive (11/22/2023)   Exercise Vital Sign    Days of Exercise per Week: 0 days    Minutes of Exercise per Session: Not on file  Stress: No Stress Concern Present (11/22/2023)   [employer] of Occupational Health - Occupational Stress Questionnaire    Feeling of Stress: Not at all  Social Connections: Moderately Isolated (11/22/2023)   Social Connection and Isolation Panel    Frequency of Communication with Friends and Family: More than three times a week    Frequency of Social Gatherings with Friends and Family: Twice a week    Attends Religious Services: Never    Database Administrator or Organizations: No    Attends Engineer, Structural: Not on file    Marital Status: Married  Depression (PHQ2-9): Low Risk (03/12/2024)   Depression (PHQ2-9)    PHQ-2 Score: 0  Alcohol Screen: Low Risk (07/17/2023)   Alcohol Screen    Last Alcohol Screening Score (AUDIT): 0  Housing: Low Risk (11/22/2023)   Epic    Unable to Pay for Housing in the Last Year: No    Number of Times Moved in the Last Year: 0    Homeless in the Last Year: No  Utilities: Not At Risk (07/17/2023)   AHC Utilities    Threatened with loss of utilities: No  Health Literacy: Adequate Health Literacy (07/17/2023)   B1300 Health Literacy    Frequency of need for help with medical instructions: Never    Subjective: Review of  Systems  Constitutional:  Negative for chills and fever.  HENT:  Negative for congestion and hearing loss.   Eyes:  Negative for blurred vision and double vision.  Respiratory:  Negative for cough and shortness of breath.   Cardiovascular:  Negative for chest pain and palpitations.  Gastrointestinal:  Positive for abdominal pain. Negative for blood in stool, constipation, diarrhea, heartburn, melena and vomiting.  Genitourinary:  Negative for dysuria and urgency.  Musculoskeletal:  Negative for joint pain and myalgias.  Skin:  Negative for itching and rash.  Neurological:  Negative for dizziness and headaches.  Psychiatric/Behavioral:  Negative for depression. The patient is not nervous/anxious.      Objective: BP 133/83 (BP Location: Left Arm, Patient Position: Sitting)   Pulse 88   Temp (!) 97.3 F (36.3 C) (Temporal)   Ht 5' 9 (1.753 m)   Wt 261 lb 3.2 oz (118.5 kg)   BMI 38.57 kg/m  Physical Exam Constitutional:      Appearance: Normal appearance.  HENT:     Head: Normocephalic and atraumatic.  Eyes:     Extraocular Movements: Extraocular movements intact.     Conjunctiva/sclera: Conjunctivae normal.  Cardiovascular:     Rate and Rhythm: Normal rate and regular rhythm.  Pulmonary:     Effort: Pulmonary effort is normal.     Breath sounds: Normal breath sounds.  Abdominal:     General: Bowel sounds are normal.     Palpations: Abdomen is soft.  Musculoskeletal:        General: No swelling. Normal range of motion.     Cervical back: Normal range of motion and neck supple.  Skin:    General: Skin is warm and dry.     Coloration: Skin is not jaundiced.  Neurological:     General: No focal deficit present.     Mental Status: She is alert and oriented to person, place, and time.  Psychiatric:        Mood and Affect: Mood normal.        Behavior: Behavior normal.      Assessment/Plan:   1.  Chronic GERD-well-controlled on omeprazole  daily. Continue.  No alarm  symptoms today to warrant further investigation with upper endoscopy.   2.  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  Linzess  is unaffordable at this point.  Will start on Amitiza 8 mg twice daily.  Given her worsening left lower quadrant abdominal pain, also has concerns about possible hernia, will order CT abdomen pelvis to further evaluate.   3.  Colon cancer screening- Will schedule for screening colonoscopy.The risks including infection, bleed, or perforation as well as benefits, limitations, alternatives and imponderables have been reviewed with the patient. Questions have been answered. All parties agreeable.      Follow-up after colonoscopy   05/26/2024 10:58 AM  "

## 2024-05-26 NOTE — Telephone Encounter (Signed)
 PA on [facility] for CT : Precertification Not Required

## 2024-05-26 NOTE — Patient Instructions (Signed)
 I am going to order a CT scan of your abdomen and pelvis to further evaluate your abdominal pain as well as to check for hernias.  We will schedule you for colonoscopy as well for colon cancer screening purposes.  I am going to start you on Amitiza 8 mg twice daily.  I sent this to your mail-in pharmacy.  It was very nice seeing you both today.  Happy new year.  Dr. Cindie

## 2024-05-26 NOTE — Telephone Encounter (Signed)
 PA on [facility] for TCS: Notification or Prior Authorization is not required for the requested services You are not required to submit a notification/prior authorization based on the information provided. Decision ID #: [id number]

## 2024-05-28 ENCOUNTER — Encounter (HOSPITAL_COMMUNITY)
Admission: RE | Admit: 2024-05-28 | Discharge: 2024-05-28 | Disposition: A | Source: Ambulatory Visit | Attending: Internal Medicine

## 2024-05-28 ENCOUNTER — Encounter (HOSPITAL_COMMUNITY): Payer: Self-pay

## 2024-05-28 NOTE — Patient Instructions (Signed)
 "   Amber Diaz  05/28/2024     @[email or web address] @   Your procedure is scheduled on Tuesday, January 6.  Report to [facility] at 1130 A.M.  Call this number if you have problems the morning of surgery:  [phone]  If you experience any cold or flu symptoms such as cough, fever, chills, shortness of breath, etc. between now and your scheduled surgery, please notify us  at the above number.   Remember:  Do not eat or drink after midnight.  You may drink clear liquids until 0930 .  Clear liquids allowed are:                    Water, Juice (No red color; non-citric and without pulp; diabetics please choose diet or no sugar options), Carbonated beverages (diabetics please choose diet or no sugar options), Clear Tea (No creamer, milk, or cream, including half & half and powdered creamer), Black Coffee Only (No creamer, milk or cream, including half & half and powdered creamer), Plain Jell-O Only (No red color; diabetics please choose no sugar options), Clear Sports drink (No red color; diabetics please choose diet or no sugar options), and Plain Popsicles Only (No red color; diabetics please choose no sugar options)    Take these medicines the morning of surgery with A SIP OF WATER prilosec    Do not wear jewelry, make-up or nail polish, including gel polish,  artificial nails, or any other type of covering on natural nails (fingers and  toes).  Do not wear lotions, powders, or perfumes, or deodorant.  Do not shave 48 hours prior to surgery.  Men may shave face and neck.  Do not bring valuables to the hospital.  [facility] is not responsible for any belongings or valuables.  Contacts, dentures or bridgework may not be worn into surgery.  Leave your suitcase in the car.  After surgery it may be brought to your room.  For patients admitted to the hospital, discharge time will be determined by your treatment team.  Patients discharged the day of surgery will not be allowed to drive home.    Name and phone number of your driver:   family Special instructions:  Please follow all instructions given to you by the office.  Please read over the following fact sheets that you were given. Pain Booklet, Anesthesia Post-op Instructions, and Care and Recovery After Surgery  Monitored Anesthesia Care, Care After The following information offers guidance on how to care for yourself after your procedure. Your health care provider may also give you more specific instructions. If you have problems or questions, contact your health care provider. What can I expect after the procedure? After the procedure, it is common to have: Tiredness. Little or no memory about what happened during or after the procedure. Impaired judgment when it comes to making decisions. Nausea or vomiting. Some trouble with balance. Follow these instructions at home: For the time period you were told by your health care provider:  Rest. Do not participate in activities where you could fall or become injured. Do not drive or use machinery. Do not drink alcohol. Do not take sleeping pills or medicines that cause drowsiness. Do not make important decisions or sign legal documents. Do not take care of children on your own. Medicines Take over-the-counter and prescription medicines only as told by your health care provider. If you were prescribed antibiotics, take them as told by your health care provider. Do not stop using the antibiotic  even if you start to feel better. Eating and drinking Follow instructions from your health care provider about what you may eat and drink. Drink enough fluid to keep your urine pale yellow. If you vomit: Drink clear fluids slowly and in small amounts as you are able. Clear fluids include water, ice chips, low-calorie sports drinks, and fruit juice that has water added to it (diluted fruit juice). Eat light and bland foods in small amounts as you are able. These foods include  bananas, applesauce, rice, lean meats, toast, and crackers. General instructions  Have a responsible adult stay with you for the time you are told. It is important to have someone help care for you until you are awake and alert. If you have sleep apnea, surgery and some medicines can increase your risk for breathing problems. Follow instructions from your health care provider about wearing your sleep device: When you are sleeping. This includes during daytime naps. While taking prescription pain medicines, sleeping medicines, or medicines that make you drowsy. Do not use any products that contain nicotine or tobacco. These products include cigarettes, chewing tobacco, and vaping devices, such as e-cigarettes. If you need help quitting, ask your health care provider. Contact a health care provider if: You feel nauseous or vomit every time you eat or drink. You feel light-headed. You are still sleepy or having trouble with balance after 24 hours. You get a rash. You have a fever. You have redness or swelling around the IV site. Get help right away if: You have trouble breathing. You have new confusion after you get home. These symptoms may be an emergency. Get help right away. Call 911. Do not wait to see if the symptoms will go away. Do not drive yourself to the hospital. This information is not intended to replace advice given to you by your health care provider. Make sure you discuss any questions you have with your health care provider. Document Revised: 10/08/2021 Document Reviewed: 10/08/2021 Elsevier Patient Education  2024 [employer].      "

## 2024-05-31 ENCOUNTER — Other Ambulatory Visit: Payer: Self-pay | Admitting: Family Medicine

## 2024-05-31 DIAGNOSIS — K219 Gastro-esophageal reflux disease without esophagitis: Secondary | ICD-10-CM

## 2024-06-01 ENCOUNTER — Ambulatory Visit (HOSPITAL_COMMUNITY): Admitting: Anesthesiology

## 2024-06-01 ENCOUNTER — Ambulatory Visit (HOSPITAL_COMMUNITY)
Admission: RE | Admit: 2024-06-01 | Discharge: 2024-06-01 | Disposition: A | Discharge status: Home / Self Care | Attending: Internal Medicine | Admitting: Internal Medicine

## 2024-06-01 ENCOUNTER — Encounter (HOSPITAL_COMMUNITY): Payer: Self-pay | Admitting: Internal Medicine

## 2024-06-01 ENCOUNTER — Encounter (INDEPENDENT_AMBULATORY_CARE_PROVIDER_SITE_OTHER): Payer: Self-pay | Admitting: *Deleted

## 2024-06-01 ENCOUNTER — Encounter (HOSPITAL_COMMUNITY): Admission: RE | Disposition: A | Payer: Self-pay | Source: Home / Self Care | Attending: Internal Medicine

## 2024-06-01 DIAGNOSIS — Z87891 Personal history of nicotine dependence: Secondary | ICD-10-CM | POA: Insufficient documentation

## 2024-06-01 DIAGNOSIS — R1032 Left lower quadrant pain: Secondary | ICD-10-CM | POA: Diagnosis not present

## 2024-06-01 DIAGNOSIS — Z1211 Encounter for screening for malignant neoplasm of colon: Secondary | ICD-10-CM | POA: Diagnosis present

## 2024-06-01 DIAGNOSIS — E66813 Obesity, class 3: Secondary | ICD-10-CM | POA: Insufficient documentation

## 2024-06-01 DIAGNOSIS — K648 Other hemorrhoids: Secondary | ICD-10-CM | POA: Insufficient documentation

## 2024-06-01 DIAGNOSIS — K5730 Diverticulosis of large intestine without perforation or abscess without bleeding: Secondary | ICD-10-CM | POA: Diagnosis not present

## 2024-06-01 DIAGNOSIS — Z6838 Body mass index (BMI) 38.0-38.9, adult: Secondary | ICD-10-CM | POA: Insufficient documentation

## 2024-06-01 DIAGNOSIS — K219 Gastro-esophageal reflux disease without esophagitis: Secondary | ICD-10-CM | POA: Insufficient documentation

## 2024-06-01 DIAGNOSIS — Z79899 Other long term (current) drug therapy: Secondary | ICD-10-CM | POA: Diagnosis not present

## 2024-06-01 DIAGNOSIS — D123 Benign neoplasm of transverse colon: Secondary | ICD-10-CM | POA: Insufficient documentation

## 2024-06-01 DIAGNOSIS — K5900 Constipation, unspecified: Secondary | ICD-10-CM | POA: Diagnosis not present

## 2024-06-01 DIAGNOSIS — I10 Essential (primary) hypertension: Secondary | ICD-10-CM | POA: Diagnosis not present

## 2024-06-01 HISTORY — PX: COLONOSCOPY: SHX5424

## 2024-06-01 HISTORY — PX: POLYPECTOMY: SHX149

## 2024-06-01 LAB — HM COLONOSCOPY

## 2024-06-01 SURGERY — COLONOSCOPY
Anesthesia: Monitor Anesthesia Care

## 2024-06-01 MED ORDER — LIDOCAINE 2% (20 MG/ML) 5 ML SYRINGE
INTRAMUSCULAR | Status: DC | PRN
  Administered 2024-06-01: 60 mg via INTRAVENOUS

## 2024-06-01 MED ORDER — PROPOFOL 10 MG/ML IV BOLUS
INTRAVENOUS | Status: DC | PRN
  Administered 2024-06-01: 30 mg via INTRAVENOUS
  Administered 2024-06-01: 100 mg via INTRAVENOUS
  Administered 2024-06-01: 125 ug/kg/min via INTRAVENOUS

## 2024-06-01 MED ORDER — LACTATED RINGERS IV SOLN: INTRAVENOUS | Status: DC

## 2024-06-01 NOTE — Op Note (Signed)
 [facility] Patient Name: Amber Diaz Procedure Date: 06/01/2024 11:20 AM MRN: [id number] Date of Birth: [birth year] Attending MD: Carlin POUR. Cindie , [place], [id number] CSN: [id number] Age: 69 Admit Type: Outpatient Procedure:                Colonoscopy Indications:              Screening for colorectal malignant neoplasm Providers:                Carlin POUR. Cindie, DO, Devere Lodge, [place]com Blush Referring MD:              Medicines:                See the Anesthesia note for documentation of the                            administered medications Complications:            No immediate complications. Estimated Blood Loss:     Estimated blood loss was minimal. Procedure:                Pre-Anesthesia Assessment:                           - The anesthesia plan was to use monitored                            anesthesia care (MAC).                           After obtaining informed consent, the colonoscope                            was passed under direct vision. Throughout the                            procedure, the patient's blood pressure, pulse, and                            oxygen saturations were monitored continuously. The                            PCF-HQ190L ([id number]) Peds Colon was introduced                            through the anus and advanced to the the cecum,                            identified by appendiceal orifice and ileocecal                            valve. The colonoscopy was performed without                            difficulty. The patient tolerated the procedure                            well. The  quality of the bowel preparation was                            evaluated using the BBPS [facility] Bowel Preparation                            Scale) with scores of: Right Colon = 3 (entire                            mucosa seen well with no residual staining, small                            fragments of stool or opaque liquid), Transverse                             Colon = 2 (minor amount of residual staining, small                            fragments of stool and/or opaque liquid, but mucosa                            seen well) and Left Colon = 2 (minor amount of                            residual staining, small fragments of stool and/or                            opaque liquid, but mucosa seen well). The total                            BBPS score equals 7. The quality of the bowel                            preparation was good. Scope In: 12:06:03 PM Scope Out: 12:19:25 PM Scope Withdrawal Time: 0 hours 8 minutes 31 seconds  Total Procedure Duration: 0 hours 13 minutes 22 seconds  Findings:      Non-bleeding internal hemorrhoids were found.      Many large-mouthed and small-mouthed diverticula were found in the       entire colon.      A 7 mm polyp was found in the transverse colon. The polyp was       semi-pedunculated. The polyp was removed with a cold snare. Resection       and retrieval were complete.      The exam was otherwise without abnormality. Impression:               - Non-bleeding internal hemorrhoids.                           - Diverticulosis in the entire examined colon.                           - One 7 mm polyp in the transverse colon, removed  with a cold snare. Resected and retrieved.                           - The examination was otherwise normal. Moderate Sedation:      Per Anesthesia Care Recommendation:           - Patient has a contact number available for                            emergencies. The signs and symptoms of potential                            delayed complications were discussed with the                            patient. Return to normal activities tomorrow.                            Written discharge instructions were provided to the                            patient.                           - Resume previous diet.                           - Continue present  medications.                           - Await pathology results.                           - Repeat colonoscopy in 7 years for surveillance.                           - Return to GI clinic in 3 months. Procedure Code(s):        --- Professional ---                           [phone], Colonoscopy, flexible; with removal of                            tumor(s), polyp(s), or other lesion(s) by snare                            technique Diagnosis Code(s):        --- Professional ---                           Z12.11, Encounter for screening for malignant                            neoplasm of colon                           K64.8, Other hemorrhoids  D12.3, Benign neoplasm of transverse colon (hepatic                            flexure or splenic flexure)                           K57.30, Diverticulosis of large intestine without                            perforation or abscess without bleeding CPT copyright 2022 American Medical Association. All rights reserved. The codes documented in this report are preliminary and upon coder review may  be revised to meet current compliance requirements. Carlin POUR. Cindie, DO Carlin POUR. Cindie, DO 06/01/2024 12:22:05 PM This report has been signed electronically. Number of Addenda: 0

## 2024-06-01 NOTE — Transfer of Care (Addendum)
 Immediate Anesthesia Transfer of Care Note  Patient: Amber Diaz  Procedure(s) Performed: COLONOSCOPY POLYPECTOMY, INTESTINE  Patient Location: Short Stay  Anesthesia Type:MAC  Level of Consciousness: drowsy and patient cooperative  Airway & Oxygen Therapy: Patient Spontanous Breathing and Patient connected to nasal cannula oxygen  Post-op Assessment: Report given to RN and Post -op Vital signs reviewed and stable  Post vital signs: Reviewed and stable  Last Vitals:  Vitals Value Taken Time  BP 97/66 06/01/24   12:25  Temp 36.9 06/01/24   12:25  Pulse 92 06/01/24   12:25  Resp 18 06/01/24   12:25  SpO2 98% 06/01/24   12:25    Last Pain:  Vitals:   06/01/24 1200  TempSrc:   PainSc: 0-No pain      Patients Stated Pain Goal: 7 (06/01/24 1138)  Complications: No notable events documented.

## 2024-06-01 NOTE — Anesthesia Preprocedure Evaluation (Signed)
"                                    Anesthesia Evaluation  Patient identified by MRN, date of birth, ID band Patient awake    Reviewed: Allergy & Precautions, H&P , NPO status , Patient's Chart, lab work & pertinent test results, reviewed documented beta blocker date and time   Airway Mallampati: II  TM Distance: >3 FB Neck ROM: full    Dental no notable dental hx.    Pulmonary shortness of breath, former smoker   Pulmonary exam normal breath sounds clear to auscultation       Cardiovascular Exercise Tolerance: Good hypertension,  Rhythm:regular Rate:Normal     Neuro/Psych  Headaches  negative psych ROS   GI/Hepatic Neg liver ROS,GERD  ,,  Endo/Other    Class 3 obesity  Renal/GU negative Renal ROS  negative genitourinary   Musculoskeletal   Abdominal   Peds  Hematology negative hematology ROS (+)   Anesthesia Other Findings   Reproductive/Obstetrics negative OB ROS                              Anesthesia Physical Anesthesia Plan  ASA: 3  Anesthesia Plan: MAC   Post-op Pain Management:    Induction:   PONV Risk Score and Plan: Propofol  infusion  Airway Management Planned:   Additional Equipment:   Intra-op Plan:   Post-operative Plan:   Informed Consent: I have reviewed the patients History and Physical, chart, labs and discussed the procedure including the risks, benefits and alternatives for the proposed anesthesia with the patient or authorized representative who has indicated his/her understanding and acceptance.     Dental Advisory Given  Plan Discussed with: CRNA  Anesthesia Plan Comments:         Anesthesia Quick Evaluation  "

## 2024-06-01 NOTE — Interval H&P Note (Signed)
 History and Physical Interval Note:  06/01/2024 11:37 AM  [name]  has presented today for surgery, with the diagnosis of IBS with constipation.  The various methods of treatment have been discussed with the patient and family. After consideration of risks, benefits and other options for treatment, the patient has consented to  Procedures with comments: COLONOSCOPY (N/A) - 1:30pm, ASA 3- room 1 ok, LM to see if pt can come earlier as a surgical intervention.  The patient's history has been reviewed, patient examined, no change in status, stable for surgery.  I have reviewed the patient's chart and labs.  Questions were answered to the patient's satisfaction.     Carlin MARLA Hasty

## 2024-06-01 NOTE — Discharge Instructions (Addendum)
" °  Colonoscopy Discharge Instructions  Read the instructions outlined below and refer to this sheet in the next few weeks. These discharge instructions provide you with general information on caring for yourself after you leave the hospital. Your doctor may also give you specific instructions. While your treatment has been planned according to the most current medical practices available, unavoidable complications occasionally occur.   ACTIVITY You may resume your regular activity, but move at a slower pace for the next 24 hours.  Take frequent rest periods for the next 24 hours.  Walking will help get rid of the air and reduce the bloated feeling in your belly (abdomen).  No driving for 24 hours (because of the medicine (anesthesia) used during the test).   Do not sign any important legal documents or operate any machinery for 24 hours (because of the anesthesia used during the test).  NUTRITION Drink plenty of fluids.  You may resume your normal diet as instructed by your doctor.  Begin with a light meal and progress to your normal diet. Heavy or fried foods are harder to digest and may make you feel sick to your stomach (nauseated).  Avoid alcoholic beverages for 24 hours or as instructed.  MEDICATIONS You may resume your normal medications unless your doctor tells you otherwise.  WHAT YOU CAN EXPECT TODAY Some feelings of bloating in the abdomen.  Passage of more gas than usual.  Spotting of blood in your stool or on the toilet paper.  IF YOU HAD POLYPS REMOVED DURING THE COLONOSCOPY: No aspirin products for 7 days or as instructed.  No alcohol for 7 days or as instructed.  Eat a soft diet for the next 24 hours.  FINDING OUT THE RESULTS OF YOUR TEST Not all test results are available during your visit. If your test results are not back during the visit, make an appointment with your caregiver to find out the results. Do not assume everything is normal if you have not heard from your  caregiver or the medical facility. It is important for you to follow up on all of your test results.  SEEK IMMEDIATE MEDICAL ATTENTION IF: You have more than a spotting of blood in your stool.  Your belly is swollen (abdominal distention).  You are nauseated or vomiting.  You have a temperature over 101.  You have abdominal pain or discomfort that is severe or gets worse throughout the day.   Your colonoscopy revealed 1 polyp(s) which I removed successfully. Await pathology results, my office will contact you. I recommend repeating colonoscopy in 7 years for surveillance purposes, depending on pathology results.  Overall, your colon appeared very healthy.  I did not see any active inflammation indicative of underlying inflammatory bowel disease such as Crohn's disease or ulcerative colitis throughout your colon.  You also have diverticulosis and internal hemorrhoids. I would recommend increasing fiber in your diet or adding OTC Benefiber/Metamucil. Be sure to drink at least 4 to 6 glasses of water daily.   Follow-up with GI in 2-3 months  I hope you have a great rest of your week!  Carlin POUR. Cindie, D.O. Gastroenterology and Hepatology [facility]  "

## 2024-06-01 NOTE — Anesthesia Procedure Notes (Signed)
 Date/Time: 06/01/2024 12:00 PM  Performed by: Rami Budhu, Belinda L, CRNAOxygen Delivery Method: Nasal cannula

## 2024-06-02 ENCOUNTER — Encounter (HOSPITAL_COMMUNITY): Payer: Self-pay | Admitting: Internal Medicine

## 2024-06-02 ENCOUNTER — Ambulatory Visit: Payer: Self-pay | Admitting: Family Medicine

## 2024-06-02 LAB — SURGICAL PATHOLOGY

## 2024-06-07 NOTE — Anesthesia Postprocedure Evaluation (Signed)
"   Anesthesia Post Note  Patient: Amber Diaz  Procedure(s) Performed: COLONOSCOPY POLYPECTOMY, INTESTINE  Patient location during evaluation: Phase II Anesthesia Type: MAC Level of consciousness: awake Pain management: pain level controlled Vital Signs Assessment: post-procedure vital signs reviewed and stable Respiratory status: spontaneous breathing and respiratory function stable Cardiovascular status: blood pressure returned to baseline and stable Postop Assessment: no headache and no apparent nausea or vomiting Anesthetic complications: no Comments: Late entry   No notable events documented.   Last Vitals:  Vitals:   06/01/24 1138 06/01/24 1225  BP: (!) 145/75 97/66  Pulse: 85 92  Resp: 18 18  Temp: 37.1 C 36.9 C  SpO2: 96% 98%    Last Pain:  Vitals:   06/01/24 1225  TempSrc: Oral  PainSc: 0-No pain                 Yvonna PARAS Celines Femia      "

## 2024-06-10 ENCOUNTER — Ambulatory Visit: Payer: Self-pay | Admitting: Internal Medicine

## 2024-06-14 NOTE — Telephone Encounter (Signed)
 7 yr TCS noted in recall Patient's PCP is on EPIC

## 2024-06-15 ENCOUNTER — Ambulatory Visit (HOSPITAL_COMMUNITY)
Admission: RE | Admit: 2024-06-15 | Discharge: 2024-06-15 | Disposition: A | Discharge status: Home / Self Care | Source: Ambulatory Visit | Attending: Internal Medicine | Admitting: Internal Medicine

## 2024-06-15 DIAGNOSIS — R1032 Left lower quadrant pain: Secondary | ICD-10-CM | POA: Insufficient documentation

## 2024-06-15 MED ORDER — IOHEXOL 300 MG/ML  SOLN: 100.0000 mL | Freq: Once | INTRAMUSCULAR | Status: DC | PRN

## 2024-06-15 MED ORDER — IOHEXOL 300 MG/ML  SOLN
100.0000 mL | Freq: Once | INTRAMUSCULAR | Status: AC | PRN
  Administered 2024-06-15: 100 mL via INTRAVENOUS

## 2024-06-18 ENCOUNTER — Ambulatory Visit: Payer: Self-pay | Admitting: Internal Medicine

## 2024-06-18 ENCOUNTER — Inpatient Hospital Stay: Attending: Oncology | Admitting: Oncology

## 2024-06-18 DIAGNOSIS — D649 Anemia, unspecified: Secondary | ICD-10-CM | POA: Diagnosis not present

## 2024-06-18 DIAGNOSIS — R1030 Lower abdominal pain, unspecified: Secondary | ICD-10-CM

## 2024-06-18 DIAGNOSIS — D0512 Intraductal carcinoma in situ of left breast: Secondary | ICD-10-CM

## 2024-06-18 NOTE — Assessment & Plan Note (Addendum)
-   Patient discontinued tamoxifen  in February 2025 due to side effects.  She is not interested in restarting. - Unable to obtain BCI testing due to DCIS.  Noninvasive cancers are low risk for recurrence and BCI testing is not warranted. - Most recent mammo was from 07/22/2023 which showed no abnormality at site of fullness/tenderness in left axilla and no evidence of malignancy in either breast.  Recommend screening mammogram in February 2026. -Patient has been complaining of fullness to both breasts over the past several months prompting reevaluation with diagnostic mammogram and ultrasound which she had done on 03/23/2024.  She is here to review the results.  Left breast showed no evidence of breast malignancy and a benign 2.6 cm seroma and right breast shows several normal lymph nodes and no mass or suspicious findings in the area of reported lump.  Screening mammogram recommended in 1 year. -Lab work shows normal CBC and CMP. -Return to clinic in 6 months with labs and see provider in clinic.

## 2024-06-18 NOTE — Progress Notes (Unsigned)
 "  [facility] OFFICE PROGRESS NOTE  Rakes, Rock HERO, FNP  ASSESSMENT & PLAN:   I connected with Amber Diaz on 06/21/24 at  1:45 PM EST by telephone visit and verified that I am speaking with the correct person using two identifiers.   I discussed the limitations, risks, security and privacy concerns of performing an evaluation and management service by telemedicine and the availability of in-person appointments. I also discussed with the patient that there may be a patient responsible charge related to this service. The patient expressed understanding and agreed to proceed.   Other persons participating in the visit and their role in the encounter: NP, Patient    Patients location: Home   Providers location: Clinic   Assessment & Plan Ductal carcinoma in situ (DCIS) of left breast - Patient discontinued tamoxifen  in February 2025 due to side effects.  She is not interested in restarting. - Unable to obtain BCI testing due to DCIS.  Noninvasive cancers are low risk for recurrence and BCI testing is not warranted. - Most recent mammo was from 07/22/2023 which showed no abnormality at site of fullness/tenderness in left axilla and no evidence of malignancy in either breast.  Recommend screening mammogram in February 2026. -Patient has been complaining of fullness to both breasts over the past several months prompting reevaluation with diagnostic mammogram and ultrasound which she had done on 03/23/2024.  She is here to review the results.  Left breast showed no evidence of breast malignancy and a benign 2.6 cm seroma and right breast shows several normal lymph nodes and no mass or suspicious findings in the area of reported lump.  Screening mammogram recommended in 1 year. -Lab work shows normal CBC and CMP. -Return to clinic in 6 months with labs and see provider in clinic. Lower abdominal pain She was recently referred to GI for colonoscopy which she had done on 06/01/2024.   Colonoscopy showed diverticulosis, internal nonbleeding hemorrhoids and one 7 mm polyp which was removed with pathology of tubular adenoma. She also had a CT of her abdomen which did not reveal any abnormality but did show diverticulosis. Continue follow-up with GI. Normocytic anemia This has resolved. Most recent iron labs from 03/05/2024 are unremarkable. Denies any melena, hematochezia or bright red blood per rectum.  No orders of the defined types were placed in this encounter.   INTERVAL HISTORY: Amber Diaz is a 69 y.o. female seen for follow-up of left breast DCIS.  She denies any surgeries, hospitalizations or changes in her baseline health.   Reports she stopped taking tamoxifen  in February due to side effects including severe hot flashes and joint pain all over.  Reports her side effects have not significantly improved since she stopped tamoxifen .  She took for a total of 3 years although she has been on several different antihormone pills.   At her last visit she was having breast fullness bilaterally so we obtained diagnostic mammogram with ultrasounds which showed a left breast seroma and right breast was unremarkable.  Reports she is not interested in having this aspirated as this has happened in the past.   Had a bone density scan completed on 01/03/2023 which showed a T-score of -1.1 with improvement from previous although she remains osteopenic.  She is currently not taking any over-the-counter supplements.   She reports new onset mid lower abdominal pain that she has had in the past but is now more constant.  Reports symptoms have been present for about  6 months.  Reports constipation at times.  She was referred back to GI and recently had a abdominal CT and colonoscopy which showed no acute findings and severe diffuse colonic diverticulosis without diverticulitis.  Colonoscopy showed diverticulosis and one 7 mm polyp which was removed.  She also had internal hemorrhoids that  were nonbleeding.  Pathology was tubular adenoma without high-grade dysplasia.  Appetite is 100% energy levels are 50%.  She denies any bright red blood per rectum, melena or hematochezia.  We reviewed CBC and CMP.  SUMMARY OF HEMATOLOGIC HISTORY: Oncology History Overview Note  Left breast high-grade DCIS, ER positive: - Screening mammogram on 08/12/2020 with asymmetry in the outer left breast in [employer], [place] . - Diagnostic mammogram/ultrasound and US  biopsy on 09/05/2020, pathology consistent with high-grade DCIS, ER/PR positive. - Lumpectomy and SLN biopsy on 10/03/2020 - Pathology with 1.4 cm DCIS, margins negative, 4/4 lymph nodes negative. - Status post XRT to the left breast - She was reportedly negative for genetic testing.  She had previous right breast biopsies x4 (1996, 2006, 2021). - She was started on anastrozole, developed nausea, vomiting and abdominal pain - Exemestane started on 01/10/2021, discontinued in the first week of February 2023 secondary to hypertension. - Letrozole  started on 07/17/2021. - She has some hot flashes which started after taking anastrozole but never went away.  They are tolerable. - She also has joint pains in the ankles, knees, hips, shoulders and wrists which are chronic and stable.    Social/family history: - She is retired print production planner.  Recently moved from [place]  to [place] . - She smoked 2 packs/day from age 64 through 69 and quit. - Mother had breast cancer at age 58 and 4.  Maternal grandmother had breast cancer at age 14.  Father had cancer on cervical spine.  Primary unknown.     Ductal carcinoma in situ (DCIS) of left breast  07/06/2021 Initial Diagnosis   Ductal carcinoma in situ (DCIS) of left breast   07/17/2021 Cancer Staging   Staging form: Breast, AJCC 8th Edition - Clinical stage from 07/17/2021: Stage 0 (cTis (DCIS), cN0, cM0, G3, ER+, PR+, HER2: Not Assessed) - Signed by Rogers Hai, MD on  07/17/2021 Stage prefix: Initial diagnosis Nuclear grade: G3 Histologic grading system: 3 grade system      CBC    Component Value Date/Time   WBC 9.3 03/05/2024 1023   RBC 4.35 03/05/2024 1023   HGB 12.4 03/05/2024 1023   HGB 13.1 11/26/2023 1043   HCT 39.0 03/05/2024 1023   HCT 42.3 11/26/2023 1043   PLT 262 03/05/2024 1023   PLT 268 11/26/2023 1043   MCV 89.7 03/05/2024 1023   MCV 89 11/26/2023 1043   MCH 28.5 03/05/2024 1023   MCHC 31.8 03/05/2024 1023   RDW 13.5 03/05/2024 1023   RDW 13.2 11/26/2023 1043   LYMPHSABS 2.0 03/05/2024 1023   LYMPHSABS 1.9 11/26/2023 1043   MONOABS 0.6 03/05/2024 1023   EOSABS 0.2 03/05/2024 1023   EOSABS 0.2 11/26/2023 1043   BASOSABS 0.1 03/05/2024 1023   BASOSABS 0.1 11/26/2023 1043       Latest Ref Rng & Units 03/05/2024   10:23 AM 11/26/2023   10:43 AM 08/29/2023   11:44 AM  CMP  Glucose 70 - 99 mg/dL 893  93  99   BUN 8 - 23 mg/dL 13  16  21    Creatinine 0.44 - 1.00 mg/dL 9.11  9.04  9.04   Sodium 135 - 145  mmol/L 145  141  138   Potassium 3.5 - 5.1 mmol/L 4.2  4.8  4.0   Chloride 98 - 111 mmol/L 107  105  106   CO2 22 - 32 mmol/L 24  20  22    Calcium  8.9 - 10.3 mg/dL 9.8  9.9  9.7   Total Protein 6.5 - 8.1 g/dL 7.0  7.1  7.4   Total Bilirubin 0.0 - 1.2 mg/dL 0.5  0.4  0.7   Alkaline Phos 38 - 126 U/L 78  86  69   AST 15 - 41 U/L 20  17  16    ALT 0 - 44 U/L 16  14  17       Lab Results  Component Value Date   FERRITIN 252 03/05/2024   VITAMINB12 224 09/05/2022    There were no vitals filed for this visit.   Review of System:  Review of Systems  Constitutional:  Positive for malaise/fatigue.  Respiratory:  Positive for cough and shortness of breath.   Gastrointestinal:  Positive for constipation.  Neurological:  Positive for dizziness, tingling and sensory change.  Psychiatric/Behavioral:  The patient has insomnia.     Physical Exam: Physical Exam Neurological:     Mental Status: She is alert and oriented to  person, place, and time.     I provided 20 minutes of non face-to-face telephone visit time during this encounter, and > 50% was spent counseling as documented under my assessment & plan.   Delon Hope, NP 06/21/2024 6:17 AM "

## 2024-07-19 ENCOUNTER — Ambulatory Visit: Payer: Self-pay

## 2024-12-14 ENCOUNTER — Inpatient Hospital Stay

## 2024-12-21 ENCOUNTER — Inpatient Hospital Stay: Admitting: Oncology
